# Patient Record
Sex: Female | Born: 1985 | Race: Black or African American | Hispanic: No | Marital: Single | Smoking: Never smoker
Health system: Southern US, Community
[De-identification: ages and names within clinical notes are randomized; demographics above are authoritative.]

## PROBLEM LIST (undated history)

## (undated) ENCOUNTER — Inpatient Hospital Stay (HOSPITAL_COMMUNITY): Payer: Self-pay

## (undated) DIAGNOSIS — Z789 Other specified health status: Secondary | ICD-10-CM

## (undated) DIAGNOSIS — Z973 Presence of spectacles and contact lenses: Secondary | ICD-10-CM

## (undated) DIAGNOSIS — O24419 Gestational diabetes mellitus in pregnancy, unspecified control: Secondary | ICD-10-CM

## (undated) DIAGNOSIS — Z8489 Family history of other specified conditions: Secondary | ICD-10-CM

## (undated) DIAGNOSIS — Z972 Presence of dental prosthetic device (complete) (partial): Secondary | ICD-10-CM

## (undated) HISTORY — DX: Other specified health status: Z78.9

## (undated) HISTORY — DX: Gestational diabetes mellitus in pregnancy, unspecified control: O24.419

## (undated) HISTORY — PX: NO PAST SURGERIES: SHX2092

---

## 2017-01-16 ENCOUNTER — Encounter (HOSPITAL_COMMUNITY): Payer: Self-pay

## 2017-01-16 ENCOUNTER — Emergency Department (HOSPITAL_COMMUNITY)
Admission: EM | Admit: 2017-01-16 | Discharge: 2017-01-16 | Disposition: A | Discharge status: Home / Self Care | Payer: Medicaid Other | Attending: Emergency Medicine | Admitting: Emergency Medicine

## 2017-01-16 DIAGNOSIS — Z3A14 14 weeks gestation of pregnancy: Secondary | ICD-10-CM | POA: Diagnosis not present

## 2017-01-16 DIAGNOSIS — O212 Late vomiting of pregnancy: Secondary | ICD-10-CM | POA: Diagnosis present

## 2017-01-16 DIAGNOSIS — O219 Vomiting of pregnancy, unspecified: Secondary | ICD-10-CM

## 2017-01-16 LAB — COMPREHENSIVE METABOLIC PANEL
ALK PHOS: 78 U/L (ref 38–126)
ALT: 31 U/L (ref 14–54)
AST: 26 U/L (ref 15–41)
Albumin: 3.2 g/dL — ABNORMAL LOW (ref 3.5–5.0)
Anion gap: 7 (ref 5–15)
BILIRUBIN TOTAL: 0.5 mg/dL (ref 0.3–1.2)
BUN: 6 mg/dL (ref 6–20)
CALCIUM: 9.5 mg/dL (ref 8.9–10.3)
CO2: 23 mmol/L (ref 22–32)
CREATININE: 0.74 mg/dL (ref 0.44–1.00)
Chloride: 104 mmol/L (ref 101–111)
Glucose, Bld: 100 mg/dL — ABNORMAL HIGH (ref 65–99)
Potassium: 3.9 mmol/L (ref 3.5–5.1)
Sodium: 134 mmol/L — ABNORMAL LOW (ref 135–145)
TOTAL PROTEIN: 7.4 g/dL (ref 6.5–8.1)

## 2017-01-16 LAB — URINALYSIS, ROUTINE W REFLEX MICROSCOPIC
BILIRUBIN URINE: NEGATIVE
Glucose, UA: NEGATIVE mg/dL
HGB URINE DIPSTICK: NEGATIVE
Ketones, ur: NEGATIVE mg/dL
Leukocytes, UA: NEGATIVE
NITRITE: NEGATIVE
PROTEIN: NEGATIVE mg/dL
Specific Gravity, Urine: 1.017 (ref 1.005–1.030)
pH: 7 (ref 5.0–8.0)

## 2017-01-16 LAB — LIPASE, BLOOD: Lipase: 32 U/L (ref 11–51)

## 2017-01-16 LAB — CBC
HCT: 38.2 % (ref 36.0–46.0)
Hemoglobin: 12.9 g/dL (ref 12.0–15.0)
MCH: 25.7 pg — AB (ref 26.0–34.0)
MCHC: 33.8 g/dL (ref 30.0–36.0)
MCV: 76.2 fL — ABNORMAL LOW (ref 78.0–100.0)
PLATELETS: 276 10*3/uL (ref 150–400)
RBC: 5.01 MIL/uL (ref 3.87–5.11)
RDW: 14.5 % (ref 11.5–15.5)
WBC: 6.7 10*3/uL (ref 4.0–10.5)

## 2017-01-16 LAB — I-STAT BETA HCG BLOOD, ED (MC, WL, AP ONLY)

## 2017-01-16 MED ORDER — ONDANSETRON 4 MG PO TBDP
4.0000 mg | ORAL_TABLET | Freq: Once | ORAL | Status: AC
  Administered 2017-01-16: 4 mg via ORAL
  Filled 2017-01-16: qty 1

## 2017-01-16 MED ORDER — PROMETHAZINE HCL 25 MG PO TABS
25.0000 mg | ORAL_TABLET | Freq: Four times a day (QID) | ORAL | 0 refills | Status: DC | PRN
Start: 2017-01-16 — End: 2017-02-06

## 2017-01-16 MED ORDER — ONDANSETRON 4 MG PO TBDP: 4.0000 mg | ORAL_TABLET | Freq: Once | ORAL | Status: DC | PRN

## 2017-01-16 NOTE — ED Triage Notes (Signed)
LMP 10-06-16, confirmed home pregnancy test.  Pt has been having vomiting since June.  Has not made first OB appt as of yet.  G1P0.  No vaginal bleeding, leaking fluid.

## 2017-01-16 NOTE — ED Provider Notes (Signed)
[facility]-EMERGENCY DEPT Provider Note   CSN: [id number] Arrival date & time: 01/16/17  1120     History   Chief Complaint Chief Complaint  Patient presents with  . Emesis During Pregnancy    HPI Stacey [name] is a 31 y.o. female.  31yo F G1P0 who p/w vomiting. Patient reports that she has had daily vomiting for the past 3 months, usually occurs in the morning. Her last menstrual period was 5/11 and she has had a positive pregnancy test at [facility]. She has not yet established care with an OB/GYN because she does not have insurance. She denies any associated fevers, diarrhea, vaginal bleeding, passage of fluid, urinary symptoms cough/cold symptoms. She reports some upper abdominal pain when she vomits, none currently. She has not taken any medications for her symptoms, although she is taking prenatal vitamin.   The history is provided by the patient.    History reviewed. No pertinent past medical history.  There are no active problems to display for this patient.   History reviewed. No pertinent surgical history.  OB History    Gravida Para Term Preterm AB Living   1             SAB TAB Ectopic Multiple Live Births                   Home Medications    Prior to Admission medications   Medication Sig Start Date End Date Taking? Authorizing Provider  promethazine (PHENERGAN) 25 MG tablet Take 1 tablet (25 mg total) by mouth every 6 (six) hours as needed for nausea or vomiting. 01/16/17   Lavoris Canizales, Ambrose Finland, MD    Family History History reviewed. No pertinent family history.  Social History Social History  Substance Use Topics  . Smoking status: Never Smoker  . Smokeless tobacco: Never Used  . Alcohol use No     Allergies   Patient has no known allergies.   Review of Systems Review of Systems All other systems reviewed and are negative except that which was mentioned in HPI   Physical Exam Updated Vital Signs BP 110/69 (BP Location: Right Arm)   Pulse 72    Temp 98.4 F (36.9 C) (Oral)   Resp 16   LMP 10/06/2016   SpO2 100%   Physical Exam  Constitutional: She is oriented to person, place, and time. She appears well-developed and well-nourished. No distress.  HENT:  Head: Normocephalic and atraumatic.  Moist mucous membranes  Eyes: Pupils are equal, round, and reactive to light. Conjunctivae are normal.  Neck: Neck supple.  Cardiovascular: Normal rate, regular rhythm and normal heart sounds.   No murmur heard. Pulmonary/Chest: Effort normal and breath sounds normal.  Abdominal: Soft. Bowel sounds are normal. She exhibits no distension. There is no tenderness.  Musculoskeletal: She exhibits no edema.  Neurological: She is alert and oriented to person, place, and time.  Fluent speech  Skin: Skin is warm and dry.  Psychiatric: She has a normal mood and affect. Judgment normal.  Nursing note and vitals reviewed.    ED Treatments / Results  Labs (all labs ordered are listed, but only abnormal results are displayed) Labs Reviewed  COMPREHENSIVE METABOLIC PANEL - Abnormal; Notable for the following:       Result Value   Sodium 134 (*)    Glucose, Bld 100 (*)    Albumin 3.2 (*)    All other components within normal limits  CBC - Abnormal; Notable for the following:  MCV 76.2 (*)    MCH 25.7 (*)    All other components within normal limits  I-STAT BETA HCG BLOOD, ED ([facility], [facility], [facility] ONLY) - Abnormal; Notable for the following:    I-stat hCG, quantitative >2,000.0 (*)    All other components within normal limits  LIPASE, BLOOD  URINALYSIS, ROUTINE W REFLEX MICROSCOPIC    EKG  EKG Interpretation None       Radiology No results found.  Procedures Procedures (including critical care time) EMERGENCY DEPARTMENT [place] PREGNANCY "Study: Limited Ultrasound of the Pelvis for Pregnancy"  INDICATIONS:Pregnancy(required) Multiple views of the uterus and pelvic cavity were obtained in real-time with a multi-frequency  probe.  APPROACH:Transabdominal  PERFORMED BY: Myself IMAGES ARCHIVED?: Yes LIMITATIONS: none PREGNANCY FREE FLUID: None ADNEXAL FINDINGS:Right ovary not seen left ovary not seen, no masses GESTATIONAL AGE, ESTIMATE: 14 wk 4d FETAL HEART RATE: 158 INTERPRETATION: Fetal heart activity seen     Medications Ordered in ED Medications  ondansetron (ZOFRAN-ODT) disintegrating tablet 4 mg (4 mg Oral Given 01/16/17 1802)  ondansetron (ZOFRAN-ODT) disintegrating tablet 4 mg (4 mg Oral Given 01/16/17 1931)     Initial Impression / Assessment and Plan / ED Course  I have reviewed the triage vital signs and the nursing notes.  Pertinent labs results that were available during my care of the patient were reviewed by me and considered in my medical decision making (see chart for details).    PT w/ daily vomiting, usually in the mornings, in setting of positive pregnancy test at home. She was nontoxic on exam with normal vital signs. No focal abdominal tenderness. Her lab work here is unremarkable including normal CBC and CMP,normal UA, no evidence of infection or dehydration. Bedside ultrasound shows normal fetal movement, fetal heart rate around 150s, BPD roughly 14 weeks which corresponds with LMP. I spent time educating the patient on routine first trimester care and emphasized the importance of establishing care with an OB/GYN as soon as possible. Referred patient to [facility] clinic. Provided with information to acquire medicaid.   Regarding her vomiting, the pattern of the vomiting and daily occurrence suggest hyperemesis gravidarum.She appears well compensated currently. Gave Zofran orally and PO challenged. Provided phenergan to use at home as pt uninsured and I was concerned about potential prohibitive cost of Zofran. Reviewed return precautions including vaginal bleeding or discharge, abdominal pain, or fever. Patient voiced understanding and was discharged in satisfactory  condition.  Final Clinical Impressions(s) / ED Diagnoses   Final diagnoses:  Nausea and vomiting during pregnancy prior to [redacted] weeks gestation    New Prescriptions New Prescriptions   PROMETHAZINE (PHENERGAN) 25 MG TABLET    Take 1 tablet (25 mg total) by mouth every 6 (six) hours as needed for nausea or vomiting.     Jadea Shiffer, Ambrose Finland, MD 01/16/17 2003

## 2017-01-16 NOTE — Discharge Instructions (Signed)
Bring your labwork, proof of income, social security card, and identification (driver's license), proof of residency and go to DEPARTMENT OF SOCIAL SERVICES first thing in the morning.   Take nausea medicine as needed; it may make you sleepy. Eat small meals frequently and avoid going long periods of time without eating. Stay well hydrated.

## 2017-01-16 NOTE — ED Notes (Signed)
Pt unable to keep fluids down

## 2017-01-18 ENCOUNTER — Ambulatory Visit (INDEPENDENT_AMBULATORY_CARE_PROVIDER_SITE_OTHER): Payer: Self-pay

## 2017-01-18 DIAGNOSIS — Z3201 Encounter for pregnancy test, result positive: Secondary | ICD-10-CM

## 2017-01-18 LAB — POCT PREGNANCY, URINE: Preg Test, Ur: POSITIVE — AB

## 2017-01-18 NOTE — Progress Notes (Signed)
Presented to the office for a UPT. UPT positive. Patient stated that she is taking prenatal vitamins and will start prenatal care.

## 2017-01-19 NOTE — Progress Notes (Signed)
Chart reviewed. Agree with CMA care.   Marny Lowenstein, PA-C 01/19/2017 10:54 AM

## 2017-02-06 ENCOUNTER — Encounter: Payer: Self-pay | Admitting: Obstetrics and Gynecology

## 2017-02-06 ENCOUNTER — Ambulatory Visit (INDEPENDENT_AMBULATORY_CARE_PROVIDER_SITE_OTHER): Payer: Medicaid Other | Admitting: Obstetrics and Gynecology

## 2017-02-06 ENCOUNTER — Other Ambulatory Visit (HOSPITAL_COMMUNITY)
Admission: RE | Admit: 2017-02-06 | Discharge: 2017-02-06 | Disposition: A | Discharge status: Home / Self Care | Payer: Medicaid Other | Source: Ambulatory Visit | Attending: Obstetrics and Gynecology | Admitting: Obstetrics and Gynecology

## 2017-02-06 VITALS — BP 133/80 | HR 109 | Ht 66.0 in | Wt 182.6 lb

## 2017-02-06 DIAGNOSIS — O219 Vomiting of pregnancy, unspecified: Secondary | ICD-10-CM | POA: Diagnosis not present

## 2017-02-06 DIAGNOSIS — Z3402 Encounter for supervision of normal first pregnancy, second trimester: Secondary | ICD-10-CM | POA: Diagnosis not present

## 2017-02-06 DIAGNOSIS — Z3689 Encounter for other specified antenatal screening: Secondary | ICD-10-CM | POA: Insufficient documentation

## 2017-02-06 DIAGNOSIS — Z331 Pregnant state, incidental: Secondary | ICD-10-CM

## 2017-02-06 DIAGNOSIS — Z23 Encounter for immunization: Secondary | ICD-10-CM | POA: Insufficient documentation

## 2017-02-06 DIAGNOSIS — Z124 Encounter for screening for malignant neoplasm of cervix: Secondary | ICD-10-CM | POA: Diagnosis not present

## 2017-02-06 DIAGNOSIS — Z3400 Encounter for supervision of normal first pregnancy, unspecified trimester: Secondary | ICD-10-CM | POA: Diagnosis present

## 2017-02-06 DIAGNOSIS — Z113 Encounter for screening for infections with a predominantly sexual mode of transmission: Secondary | ICD-10-CM | POA: Diagnosis not present

## 2017-02-06 DIAGNOSIS — Z302 Encounter for sterilization: Secondary | ICD-10-CM | POA: Insufficient documentation

## 2017-02-06 DIAGNOSIS — Z3490 Encounter for supervision of normal pregnancy, unspecified, unspecified trimester: Secondary | ICD-10-CM | POA: Insufficient documentation

## 2017-02-06 LAB — POCT URINALYSIS DIP (DEVICE)
BILIRUBIN URINE: NEGATIVE
Glucose, UA: NEGATIVE mg/dL
HGB URINE DIPSTICK: NEGATIVE
Ketones, ur: NEGATIVE mg/dL
LEUKOCYTES UA: NEGATIVE
NITRITE: NEGATIVE
PH: 6 (ref 5.0–8.0)
Protein, ur: 30 mg/dL — AB
SPECIFIC GRAVITY, URINE: 1.02 (ref 1.005–1.030)
UROBILINOGEN UA: 0.2 mg/dL (ref 0.0–1.0)

## 2017-02-06 MED ORDER — PROMETHAZINE HCL 25 MG PO TABS: 25.0000 mg | ORAL_TABLET | Freq: Every evening | ORAL | 1 refills | Status: DC | PRN

## 2017-02-06 MED ORDER — ONDANSETRON 8 MG PO TBDP: 8.0000 mg | ORAL_TABLET | Freq: Three times a day (TID) | ORAL | 1 refills | Status: DC | PRN

## 2017-02-06 MED ORDER — METOCLOPRAMIDE HCL 10 MG PO TABS: 10.0000 mg | ORAL_TABLET | Freq: Three times a day (TID) | ORAL | 1 refills | Status: DC

## 2017-02-06 MED ORDER — RANITIDINE HCL 150 MG PO TABS: 150.0000 mg | ORAL_TABLET | Freq: Two times a day (BID) | ORAL | 1 refills | Status: DC

## 2017-02-06 NOTE — Progress Notes (Signed)
Subjective:   Stacey [name] is a 31 y.o. G1P0 at [redacted]w[redacted]d by LMP being seen today for her first obstetrical visit.  Her obstetrical history is significant for healthy female , nausea and vomiting in pregnancy. Taking phenergan however makes her somnolent.  Patient does intend to breast feed. Pregnancy history fully reviewed. Planned pregnancy, FOB involved.   Patient reports nausea and vomiting daily. Multiple times per day. Has tried phenergan however concerned about how it makes her sleepy. She was only given 12 tablets.   HISTORY: Obstetric History   G1   P0   T0   P0   A0   L0    SAB0   TAB0   Ectopic0   Multiple0   Live Births0     # Outcome Date GA Lbr Len/2nd Weight Sex Delivery Anes PTL Lv  1 Current              Past Medical History:  Diagnosis Date  . Medical history non-contributory    Past Surgical History:  Procedure Laterality Date  . NO PAST SURGERIES     Family History  Problem Relation Age of Onset  . Hypertension Mother    Social History  Substance Use Topics  . Smoking status: Never Smoker  . Smokeless tobacco: Never Used  . Alcohol use No   No Known Allergies Current Outpatient Prescriptions on File Prior to Visit  Medication Sig Dispense Refill  . Prenatal Multivit-Min-Fe-FA (PRENATAL VITAMINS PO) Take by mouth.     No current facility-administered medications on file prior to visit.      Exam   Vitals:   02/06/17 1007 02/06/17 1013  BP: 133/80   Pulse: (!) 109   Weight: 182 lb 9.6 oz (82.8 kg)   Height:   (1.676 m)   Fetal Heart Rate (bpm): 145  Uterus:     Pelvic Exam: Perineum: no hemorrhoids, normal perineum   Vulva: normal external genitalia, no lesions   Vagina:  normal mucosa, normal discharge   Cervix: no lesions and normal, pap smear done.    Adnexa: normal adnexa and no mass, fullness, tenderness   Bony Pelvis: average  System: General: well-developed, well-nourished female in no acute distress   Breast:  normal  appearance, no masses or tenderness   Skin: normal coloration and turgor, no rashes   Neurologic: oriented, normal, negative, normal mood   Extremities: normal strength, tone, and muscle mass, ROM of all joints is normal   HEENT PERRLA, extraocular movement intact and sclera clear, anicteric   Mouth/Teeth mucous membranes moist, pharynx normal without lesions and dental hygiene good   Neck supple and no masses   Cardiovascular: regular rate and rhythm   Respiratory:  no respiratory distress, normal breath sounds   Abdomen: soft, non-tender; bowel sounds normal; no masses,  no organomegaly     Assessment:   Pregnancy: G1P0 Patient Active Problem List   Diagnosis Date Noted  . Supervision of low-risk first pregnancy 02/06/2017  . Nausea and vomiting in pregnancy prior to [redacted] weeks gestation 02/06/2017  . Needs flu shot 02/06/2017  . Screening, antenatal, for fetal anatomic survey 02/06/2017     Plan:  1. Encounter for supervision of low-risk first pregnancy, antepartum  - Flu Vaccine QUAD 36+ mos IM (Fluarix, Quad PF) - Obstetric Panel, Including HIV - Hemoglobinopathy Evaluation - Culture, OB Urine - [place] MFM OB COMP + 14 WK; Future - Enroll patient in Babyscripts Program - AFP TETRA - Cytology -  PAP  2. Needs flu shot  - Flu Vaccine QUAD 36+ mos IM (Fluarix, Quad PF)  3. Screening, antenatal, for fetal anatomic survey  - US MFM OB COMP + 14 WK; Future  4. Nausea and vomiting in pregnancy prior to [redacted] weeks gestation  Rx: Zantac, Phenergan, Reglan, Zofran. Do not take phenergan and reglan together.  Small, frequent meals.  Increase PO fluids.    Initial labs drawn. Continue prenatal vitamins. Genetic Screening discussed, quad screen requested  Ultrasound discussed; fetal anatomic survey: requested. Problem list reviewed and updated. The nature of [facility] with multiple MDs and other Advanced Practice Providers was explained to  patient; also emphasized that residents, students are part of our team. Routine obstetric precautions reviewed. Return in about 4 weeks (around 03/06/2017).   Josalyn Dettmann, Harolyn RutherfordJennifer I, NP [facility], [facility]

## 2017-02-06 NOTE — Progress Notes (Signed)
Babyscripts optimized schedule Flu vaccine today Initial prenatal labs/pap today Schedule u/s

## 2017-02-08 LAB — CULTURE, OB URINE

## 2017-02-08 LAB — URINE CULTURE, OB REFLEX

## 2017-02-09 LAB — CYTOLOGY - PAP
Adequacy: ABSENT
Chlamydia: NEGATIVE
Diagnosis: NEGATIVE
HPV: NOT DETECTED
Neisseria Gonorrhea: NEGATIVE

## 2017-02-14 ENCOUNTER — Encounter: Payer: Self-pay | Admitting: Obstetrics and Gynecology

## 2017-02-14 DIAGNOSIS — D563 Thalassemia minor: Secondary | ICD-10-CM | POA: Insufficient documentation

## 2017-02-14 DIAGNOSIS — D573 Sickle-cell trait: Secondary | ICD-10-CM | POA: Insufficient documentation

## 2017-02-14 LAB — OBSTETRIC PANEL, INCLUDING HIV
Antibody Screen: NEGATIVE
BASOS ABS: 0 10*3/uL (ref 0.0–0.2)
BASOS: 0 %
EOS (ABSOLUTE): 0.1 10*3/uL (ref 0.0–0.4)
Eos: 1 %
HEMATOCRIT: 38.7 % (ref 34.0–46.6)
HIV Screen 4th Generation wRfx: NONREACTIVE
Hemoglobin: 12.6 g/dL (ref 11.1–15.9)
Hepatitis B Surface Ag: NEGATIVE
IMMATURE GRANS (ABS): 0 10*3/uL (ref 0.0–0.1)
IMMATURE GRANULOCYTES: 0 %
LYMPHS: 23 %
Lymphocytes Absolute: 2 10*3/uL (ref 0.7–3.1)
MCH: 25.7 pg — ABNORMAL LOW (ref 26.6–33.0)
MCHC: 32.6 g/dL (ref 31.5–35.7)
MCV: 79 fL (ref 79–97)
MONOS ABS: 0.6 10*3/uL (ref 0.1–0.9)
Monocytes: 7 %
NEUTROS PCT: 69 %
Neutrophils Absolute: 5.7 10*3/uL (ref 1.4–7.0)
Platelets: 263 10*3/uL (ref 150–379)
RBC: 4.91 x10E6/uL (ref 3.77–5.28)
RDW: 16.4 % — AB (ref 12.3–15.4)
RPR Ser Ql: NONREACTIVE
RUBELLA: 24.2 {index} (ref >0.99)
Rh Factor: POSITIVE
WBC: 8.4 10*3/uL (ref 3.4–10.8)

## 2017-02-14 LAB — HEMOGLOBINOPATHY EVALUATION
Ferritin: 87 ng/mL (ref 15–150)
HGB A: 62.5 % — AB (ref 96.4–98.8)
HGB F QUANT: 0 % (ref 0.0–2.0)
HGB S: 33.5 % — AB
HGB SOLUBILITY: POSITIVE — AB
HGB VARIANT: 0 %
Hgb A2 Quant: 4 % — ABNORMAL HIGH (ref 1.8–3.2)
Hgb C: 0 %

## 2017-02-14 LAB — ALPHA-THALASSEMIA

## 2017-02-15 ENCOUNTER — Encounter: Payer: Self-pay | Admitting: Obstetrics & Gynecology

## 2017-02-19 ENCOUNTER — Ambulatory Visit (HOSPITAL_COMMUNITY)
Admission: RE | Admit: 2017-02-19 | Discharge: 2017-02-19 | Disposition: A | Discharge status: Home / Self Care | Payer: Medicaid Other | Source: Ambulatory Visit | Attending: Obstetrics and Gynecology | Admitting: Obstetrics and Gynecology

## 2017-02-19 DIAGNOSIS — Z3400 Encounter for supervision of normal first pregnancy, unspecified trimester: Secondary | ICD-10-CM

## 2017-02-19 DIAGNOSIS — Z3689 Encounter for other specified antenatal screening: Secondary | ICD-10-CM | POA: Diagnosis not present

## 2017-02-19 DIAGNOSIS — Z3A19 19 weeks gestation of pregnancy: Secondary | ICD-10-CM | POA: Diagnosis not present

## 2017-03-06 ENCOUNTER — Encounter: Payer: Self-pay | Admitting: Family Medicine

## 2017-03-06 ENCOUNTER — Ambulatory Visit (INDEPENDENT_AMBULATORY_CARE_PROVIDER_SITE_OTHER): Payer: Medicaid Other | Admitting: Family Medicine

## 2017-03-06 VITALS — BP 124/70 | HR 110 | Wt 185.0 lb

## 2017-03-06 DIAGNOSIS — Z3689 Encounter for other specified antenatal screening: Secondary | ICD-10-CM

## 2017-03-06 DIAGNOSIS — Z3402 Encounter for supervision of normal first pregnancy, second trimester: Secondary | ICD-10-CM

## 2017-03-06 MED ORDER — DOXYLAMINE SUCCINATE (SLEEP) 25 MG PO TABS: 25.0000 mg | ORAL_TABLET | Freq: Every evening | ORAL | 0 refills | Status: DC | PRN

## 2017-03-06 NOTE — Addendum Note (Signed)
Addended by: Rolm Bookbinder on: 03/06/2017 03:54 PM   Modules accepted: Orders

## 2017-03-06 NOTE — Progress Notes (Signed)
Patient stated having some back pain

## 2017-03-06 NOTE — Addendum Note (Signed)
Addended by: Garret Reddish on: 03/06/2017 04:03 PM   Modules accepted: Orders

## 2017-03-06 NOTE — Progress Notes (Signed)
   PRENATAL VISIT NOTE  Subjective:  Stacey [name] is a 31 y.o. G1P0 at [redacted]w[redacted]d being seen today for ongoing prenatal care.  She is currently monitored for the following issues for this low-risk pregnancy and has Supervision of low-risk first pregnancy; Nausea and vomiting in pregnancy prior to [redacted] weeks gestation; Needs flu shot; Screening, antenatal, for fetal anatomic survey; Sickle cell trait (HCC); and Alpha thalassemia trait on her problem list.  Patient reports occasional back pain that is relieved by tylenol.  Contractions: Not present.  .  Movement: Present. Denies leaking of fluid.   The following portions of the patient's history were reviewed and updated as appropriate: allergies, current medications, past family history, past medical history, past social history, past surgical history and problem list. Problem list updated.  Objective:   Vitals:   03/06/17 1525  BP: 124/70  Pulse: (!) 110  Weight: 185 lb (83.9 kg)    Fetal Status: Fetal Heart Rate (bpm): 146 Fundal Height: 20 cm Movement: Present     General:  Alert, oriented and cooperative. Patient is in no acute distress.  Skin: Skin is warm and dry. No rash noted.   Cardiovascular: Normal heart rate noted  Respiratory: Normal respiratory effort, no problems with respiration noted  Abdomen: Soft, gravid, appropriate for gestational age.  Pain/Pressure: Present     Pelvic: Cervical exam deferred        Extremities: Normal range of motion.  Edema: None  Mental Status:  Normal mood and affect. Normal behavior. Normal judgment and thought content.   Assessment and Plan:  Pregnancy: G1P0 at [redacted]w[redacted]d Will need repeat US around 10/22- will schedule.  Preterm labor symptoms and general obstetric precautions including but not limited to vaginal bleeding, contractions, leaking of fluid and fetal movement were reviewed in detail with the patient. Please refer to After Visit Summary for other counseling recommendations.  Return in  about 4 weeks (around 04/03/2017).   Rolm Bookbinder, DO

## 2017-03-09 ENCOUNTER — Encounter: Payer: Medicaid Other | Admitting: Family Medicine

## 2017-03-19 ENCOUNTER — Ambulatory Visit (HOSPITAL_COMMUNITY): Payer: Medicaid Other

## 2017-03-23 ENCOUNTER — Ambulatory Visit (HOSPITAL_COMMUNITY)
Admission: RE | Admit: 2017-03-23 | Discharge: 2017-03-23 | Disposition: A | Discharge status: Home / Self Care | Payer: Medicaid Other | Source: Ambulatory Visit | Attending: Family Medicine | Admitting: Family Medicine

## 2017-03-23 DIAGNOSIS — Z862 Personal history of diseases of the blood and blood-forming organs and certain disorders involving the immune mechanism: Secondary | ICD-10-CM | POA: Insufficient documentation

## 2017-03-23 DIAGNOSIS — Z3689 Encounter for other specified antenatal screening: Secondary | ICD-10-CM | POA: Insufficient documentation

## 2017-03-23 DIAGNOSIS — Z3A23 23 weeks gestation of pregnancy: Secondary | ICD-10-CM | POA: Diagnosis not present

## 2017-03-26 ENCOUNTER — Emergency Department (HOSPITAL_COMMUNITY)
Admission: EM | Admit: 2017-03-26 | Discharge: 2017-03-26 | Disposition: A | Discharge status: Home / Self Care | Payer: Medicaid Other | Attending: Emergency Medicine | Admitting: Emergency Medicine

## 2017-03-26 ENCOUNTER — Encounter (HOSPITAL_COMMUNITY): Payer: Self-pay | Admitting: Emergency Medicine

## 2017-03-26 ENCOUNTER — Emergency Department (HOSPITAL_COMMUNITY): Payer: Medicaid Other

## 2017-03-26 DIAGNOSIS — R197 Diarrhea, unspecified: Secondary | ICD-10-CM | POA: Diagnosis not present

## 2017-03-26 DIAGNOSIS — O26892 Other specified pregnancy related conditions, second trimester: Secondary | ICD-10-CM | POA: Diagnosis not present

## 2017-03-26 DIAGNOSIS — R1010 Upper abdominal pain, unspecified: Secondary | ICD-10-CM | POA: Diagnosis present

## 2017-03-26 DIAGNOSIS — R1011 Right upper quadrant pain: Secondary | ICD-10-CM

## 2017-03-26 DIAGNOSIS — Z79899 Other long term (current) drug therapy: Secondary | ICD-10-CM | POA: Insufficient documentation

## 2017-03-26 DIAGNOSIS — O99612 Diseases of the digestive system complicating pregnancy, second trimester: Secondary | ICD-10-CM | POA: Insufficient documentation

## 2017-03-26 DIAGNOSIS — K859 Acute pancreatitis without necrosis or infection, unspecified: Secondary | ICD-10-CM

## 2017-03-26 DIAGNOSIS — O218 Other vomiting complicating pregnancy: Secondary | ICD-10-CM | POA: Diagnosis not present

## 2017-03-26 DIAGNOSIS — Z3A24 24 weeks gestation of pregnancy: Secondary | ICD-10-CM | POA: Insufficient documentation

## 2017-03-26 DIAGNOSIS — M549 Dorsalgia, unspecified: Secondary | ICD-10-CM | POA: Insufficient documentation

## 2017-03-26 LAB — COMPREHENSIVE METABOLIC PANEL
ALBUMIN: 2.8 g/dL — AB (ref 3.5–5.0)
ALK PHOS: 180 U/L — AB (ref 38–126)
ALT: 27 U/L (ref 14–54)
ANION GAP: 7 (ref 5–15)
AST: 35 U/L (ref 15–41)
CALCIUM: 9.1 mg/dL (ref 8.9–10.3)
CO2: 23 mmol/L (ref 22–32)
Chloride: 105 mmol/L (ref 101–111)
Creatinine, Ser: 0.69 mg/dL (ref 0.44–1.00)
GFR calc Af Amer: >60 mL/min (ref >60)
GFR calc non Af Amer: >60 mL/min (ref >60)
GLUCOSE: 110 mg/dL — AB (ref 65–99)
Potassium: 3.5 mmol/L (ref 3.5–5.1)
SODIUM: 135 mmol/L (ref 135–145)
Total Bilirubin: 0.6 mg/dL (ref 0.3–1.2)
Total Protein: 6.8 g/dL (ref 6.5–8.1)

## 2017-03-26 LAB — URINALYSIS, ROUTINE W REFLEX MICROSCOPIC
Bilirubin Urine: NEGATIVE
Glucose, UA: NEGATIVE mg/dL
Hgb urine dipstick: NEGATIVE
Ketones, ur: NEGATIVE mg/dL
Leukocytes, UA: NEGATIVE
Nitrite: NEGATIVE
Protein, ur: 30 mg/dL — AB
SPECIFIC GRAVITY, URINE: 1.012 (ref 1.005–1.030)
pH: 8 (ref 5.0–8.0)

## 2017-03-26 LAB — CBC
HEMATOCRIT: 34.9 % — AB (ref 36.0–46.0)
HEMOGLOBIN: 11.6 g/dL — AB (ref 12.0–15.0)
MCH: 26.4 pg (ref 26.0–34.0)
MCHC: 33.2 g/dL (ref 30.0–36.0)
MCV: 79.5 fL (ref 78.0–100.0)
Platelets: 269 10*3/uL (ref 150–400)
RBC: 4.39 MIL/uL (ref 3.87–5.11)
RDW: 14.5 % (ref 11.5–15.5)
WBC: 9.3 10*3/uL (ref 4.0–10.5)

## 2017-03-26 LAB — LIPASE, BLOOD: Lipase: 128 U/L — ABNORMAL HIGH (ref 11–51)

## 2017-03-26 MED ORDER — SODIUM CHLORIDE 0.9 % IV BOLUS (SEPSIS)
1000.0000 mL | Freq: Once | INTRAVENOUS | Status: AC
  Administered 2017-03-26: 1000 mL via INTRAVENOUS

## 2017-03-26 MED ORDER — GI COCKTAIL ~~LOC~~
30.0000 mL | Freq: Once | ORAL | Status: AC
  Administered 2017-03-26: 30 mL via ORAL
  Filled 2017-03-26: qty 30

## 2017-03-26 MED ORDER — HYDROCODONE-ACETAMINOPHEN 5-325 MG PO TABS: 1.0000 | ORAL_TABLET | ORAL | 0 refills | Status: DC | PRN

## 2017-03-26 MED ORDER — ONDANSETRON HCL 4 MG/2ML IJ SOLN
4.0000 mg | Freq: Once | INTRAMUSCULAR | Status: AC
  Administered 2017-03-26: 4 mg via INTRAVENOUS
  Filled 2017-03-26: qty 2

## 2017-03-26 MED ORDER — HYDROCODONE-ACETAMINOPHEN 5-325 MG PO TABS
2.0000 | ORAL_TABLET | Freq: Once | ORAL | Status: AC
  Administered 2017-03-26: 2 via ORAL
  Filled 2017-03-26: qty 2

## 2017-03-26 MED ORDER — PROMETHAZINE HCL 25 MG PO TABS: 25.0000 mg | ORAL_TABLET | Freq: Four times a day (QID) | ORAL | 0 refills | Status: DC | PRN

## 2017-03-26 MED ORDER — PROMETHAZINE HCL 25 MG PO TABS
25.0000 mg | ORAL_TABLET | Freq: Once | ORAL | Status: AC
Start: 2017-03-26 — End: 2017-03-26
  Administered 2017-03-26: 25 mg via ORAL
  Filled 2017-03-26: qty 1

## 2017-03-26 NOTE — ED Provider Notes (Signed)
[facility] EMERGENCY DEPARTMENT Provider Note   CSN: [id number] Arrival date & time: 03/26/17  0439     History   Chief Complaint Chief Complaint  Patient presents with  . Abdominal Pain    HPI Stacey [name] is a 31 y.o. female.  Patient is [redacted] weeks pregnant G1P0 presenting with upper abdominal pain that woke her from sleep.  The pain is constant and radiates to her upper abdomen and right side.  Associated multiple episodes of nausea and vomiting.  She said vomiting throughout this pregnancy though none in the past week.  Denies any pain with urination or blood in the urine.  No vaginal bleeding or leak of fluid.  No contractions.  Denies any chest pain or shortness of breath.  Denies any other medical problems or previous surgeries.  He states she woke from sleep about 3 AM with this pain has been constant.  She has not had this pain in this pregnancy previously.   The history is provided by the patient and the spouse.  Abdominal Pain   Associated symptoms include diarrhea, nausea and vomiting. Pertinent negatives include fever, dysuria, hematuria, headaches, arthralgias and myalgias.    Past Medical History:  Diagnosis Date  . Medical history non-contributory     Patient Active Problem List   Diagnosis Date Noted  . Sickle cell trait (HCC) 02/14/2017  . Alpha thalassemia trait 02/14/2017  . Supervision of low-risk first pregnancy 02/06/2017  . Nausea and vomiting in pregnancy prior to [redacted] weeks gestation 02/06/2017  . Needs flu shot 02/06/2017  . Screening, antenatal, for fetal anatomic survey 02/06/2017    Past Surgical History:  Procedure Laterality Date  . NO PAST SURGERIES      OB History    Gravida Para Term Preterm AB Living   1             SAB TAB Ectopic Multiple Live Births                   Home Medications    Prior to Admission medications   Medication Sig Start Date End Date Taking? Authorizing Provider  doxylamine, Sleep,  (UNISOM) 25 MG tablet Take 1 tablet (25 mg total) by mouth at bedtime as needed. 03/06/17   Moss, Amber, DO  metoCLOPramide (REGLAN) 10 MG tablet Take 1 tablet (10 mg total) by mouth 3 (three) times daily before meals. 02/06/17 02/06/18  Rasch, Victorino Dike I, NP  ondansetron (ZOFRAN ODT) 8 MG disintegrating tablet Take 1 tablet (8 mg total) by mouth every 8 (eight) hours as needed for nausea or vomiting. 02/06/17   Rasch, Harolyn Rutherford, NP  Prenatal Multivit-Min-Fe-FA (PRENATAL VITAMINS PO) Take by mouth.    [provider]  promethazine (PHENERGAN) 25 MG tablet Take 1 tablet (25 mg total) by mouth at bedtime as needed for nausea or vomiting. 02/06/17   Rasch, Harolyn Rutherford, NP  ranitidine (ZANTAC) 150 MG tablet Take 1 tablet (150 mg total) by mouth 2 (two) times daily. 02/06/17 02/06/18  Rasch, Harolyn Rutherford, NP    Family History Family History  Problem Relation Age of Onset  . Hypertension Mother     Social History Social History  Substance Use Topics  . Smoking status: Never Smoker  . Smokeless tobacco: Never Used  . Alcohol use No     Allergies   Patient has no known allergies.   Review of Systems Review of Systems  Constitutional: Negative for activity change, appetite change and fever.  Eyes: Negative for visual disturbance.  Respiratory: Negative for cough, chest tightness and shortness of breath.   Cardiovascular: Negative for chest pain.  Gastrointestinal: Positive for abdominal pain, diarrhea, nausea and vomiting.  Genitourinary: Negative for dysuria, hematuria, urgency, vaginal bleeding and vaginal discharge.  Musculoskeletal: Positive for back pain. Negative for arthralgias and myalgias.  Skin: Negative for rash.  Neurological: Negative for dizziness, weakness and headaches.    all other systems are negative except as noted in the HPI and PMH.    Physical Exam Updated Vital Signs BP 122/74 (BP Location: Right Arm)   Pulse (!) 112   Temp 98.6 F (37 C) (Oral)   Resp  19   Ht 5\' 6"  (1.676 m)   Wt 83.9 kg (185 lb)   LMP 10/06/2016 (Approximate)   SpO2 100%   BMI 29.86 kg/m   Physical Exam  Constitutional: She is oriented to person, place, and time. She appears well-developed and well-nourished. No distress.  uncomfortable  HENT:  Head: Normocephalic and atraumatic.  Mouth/Throat: Oropharynx is clear and moist. No oropharyngeal exudate.  Eyes: Pupils are equal, round, and reactive to light. Conjunctivae and EOM are normal.  Neck: Normal range of motion. Neck supple.  No meningismus.  Cardiovascular: Normal rate, regular rhythm, normal heart sounds and intact distal pulses.   No murmur heard. Pulmonary/Chest: Effort normal and breath sounds normal. No respiratory distress.  Abdominal: Soft. There is tenderness. There is no rebound and no guarding.  TTP epigastrium and RUQ. Gravid lower abdomen, nontender  Musculoskeletal: Normal range of motion. She exhibits no edema or tenderness.  No CVAT  Neurological: She is alert and oriented to person, place, and time. No cranial nerve deficit. She exhibits normal muscle tone. Coordination normal.   5/5 strength throughout. CN 2-12 intact.Equal grip strength.   Skin: Skin is warm.  Psychiatric: She has a normal mood and affect. Her behavior is normal.  Nursing note and vitals reviewed.    ED Treatments / Results  Labs (all labs ordered are listed, but only abnormal results are displayed) Labs Reviewed  LIPASE, BLOOD - Abnormal; Notable for the following:       Result Value   Lipase 128 (*)    All other components within normal limits  COMPREHENSIVE METABOLIC PANEL - Abnormal; Notable for the following:    Glucose, Bld 110 (*)    BUN <5 (*)    Albumin 2.8 (*)    Alkaline Phosphatase 180 (*)    All other components within normal limits  CBC - Abnormal; Notable for the following:    Hemoglobin 11.6 (*)    HCT 34.9 (*)    All other components within normal limits  URINALYSIS, ROUTINE W REFLEX  MICROSCOPIC - Abnormal; Notable for the following:    APPearance HAZY (*)    Protein, ur 30 (*)    Bacteria, UA RARE (*)    Squamous Epithelial / LPF 6-30 (*)    All other components within normal limits    EKG  EKG Interpretation None       Radiology US Abdomen Limited Ruq  Result Date: 03/26/2017 CLINICAL DATA:  RIGHT upper quadrant pain for 2 hours. [redacted] weeks pregnant. EXAM: ULTRASOUND ABDOMEN LIMITED RIGHT UPPER QUADRANT COMPARISON:  None. FINDINGS: Gallbladder: No gallstones or wall thickening visualized. No sonographic Murphy sign noted by sonographer. Common bile duct: Diameter: 4 mm Liver: No focal lesion identified. Within normal limits in parenchymal echogenicity. Portal vein is patent on color Doppler imaging with normal  direction of blood flow towards the liver. IMPRESSION: Normal RIGHT upper quadrant ultrasound. Electronically Signed   By: Awilda Metroourtnay  Bloomer M.D.   On: 03/26/2017 06:42    Procedures Procedures (including critical care time)  Medications Ordered in ED Medications  sodium chloride 0.9 % bolus 1,000 mL (not administered)  ondansetron (ZOFRAN) injection 4 mg (not administered)  gi cocktail (Maalox,Lidocaine,Donnatal) (not administered)     Initial Impression / Assessment and Plan / ED Course  I have reviewed the triage vital signs and the nursing notes.  Pertinent labs & imaging results that were available during my care of the patient were reviewed by me and considered in my medical decision making (see chart for details).    G1 P0 at [redacted] weeks gestation presenting with upper abdominal pain with nausea and vomiting.  No fever.  No vaginal or urinary symptoms  Patient given IV fluids and symptom control.  Labs show mild elevation of lipase at 128.  Ultrasound will be obtained to evaluate gallbladder  IVF, GI cocktail. Antiemetics. US shows no gallstones.  Patient agreeable to short course of pain medications with the understanding that any  medications given will potentially affect fetus.  OB RR nurse Olegario MessierKathy has seen patient. Reassuring tracing.  OB cleared per Dr. Emelda FearFerguson.  Pain and nausea controlled in the ED. Patient is tolerating PO. Advised clear liquid diet for several days and OB followup. Return precautions discussed.  Final Clinical Impressions(s) / ED Diagnoses   Final diagnoses:  RUQ pain  Acute pancreatitis, unspecified complication status, unspecified pancreatitis type    New Prescriptions New Prescriptions   No medications on file     Glynn Octaveancour, Levi Klaiber, MD 03/26/17 [phone]

## 2017-03-26 NOTE — ED Notes (Signed)
Pt complains of upper abd pain in the center and right side. Pt states this started at 0300 hrs this date and woke her up.

## 2017-03-26 NOTE — ED Notes (Signed)
OB rapid respond notified. 

## 2017-03-26 NOTE — Discharge Instructions (Signed)
Keep yourself hydrated.  Follow-up with your doctor.  Return to the ED if you develop new or worsening symptoms. °

## 2017-03-26 NOTE — ED Triage Notes (Signed)
Pt c/o 9/10 abd pain, with nausea and vomiting for the past 3 hours, no fever or chills, denies any vaginal discharge or bleeding pt is [redacted] weeks pregnant.

## 2017-04-24 ENCOUNTER — Ambulatory Visit (INDEPENDENT_AMBULATORY_CARE_PROVIDER_SITE_OTHER): Payer: Medicaid Other

## 2017-04-24 VITALS — BP 126/74 | HR 118 | Wt 183.1 lb

## 2017-04-24 DIAGNOSIS — O219 Vomiting of pregnancy, unspecified: Secondary | ICD-10-CM | POA: Diagnosis not present

## 2017-04-24 DIAGNOSIS — Z23 Encounter for immunization: Secondary | ICD-10-CM | POA: Diagnosis not present

## 2017-04-24 DIAGNOSIS — Z3400 Encounter for supervision of normal first pregnancy, unspecified trimester: Secondary | ICD-10-CM

## 2017-04-24 DIAGNOSIS — O0933 Supervision of pregnancy with insufficient antenatal care, third trimester: Secondary | ICD-10-CM | POA: Diagnosis not present

## 2017-04-24 MED ORDER — ONDANSETRON 8 MG PO TBDP: 8.0000 mg | ORAL_TABLET | Freq: Three times a day (TID) | ORAL | 2 refills | Status: DC | PRN

## 2017-04-24 NOTE — Patient Instructions (Signed)

## 2017-04-24 NOTE — Progress Notes (Signed)
   PRENATAL VISIT NOTE  Subjective:  Stacey [name] is a 31 y.o. G1P0 at 2440w6d being seen today for ongoing prenatal care.  She is currently monitored for the following issues for this low-risk pregnancy and has Supervision of low-risk first pregnancy; Nausea and vomiting in pregnancy prior to [redacted] weeks gestation; Needs flu shot; Screening, antenatal, for fetal anatomic survey; Sickle cell trait (HCC); and Alpha thalassemia trait on their problem list.  Patient reports no complaints.  Contractions: Not present. Vag. Bleeding: None.  Movement: Present. Denies leaking of fluid.   The following portions of the patient's history were reviewed and updated as appropriate: allergies, current medications, past family history, past medical history, past social history, past surgical history and problem list. Problem list updated.  Objective:   Vitals:   04/24/17 1027  BP: 126/74  Pulse: (!) 118  Weight: 183 lb 1.6 oz (83.1 kg)    Fetal Status: Fetal Heart Rate (bpm): 147   Movement: Present     General:  Alert, oriented and cooperative. Patient is in no acute distress.  Skin: Skin is warm and dry. No rash noted.   Cardiovascular: Normal heart rate noted  Respiratory: Normal respiratory effort, no problems with respiration noted  Abdomen: Soft, gravid, appropriate for gestational age.  Pain/Pressure: Absent     Pelvic: Cervical exam deferred        Extremities: Normal range of motion.  Edema: None  Mental Status:  Normal mood and affect. Normal behavior. Normal judgment and thought content.   Assessment and Plan:  Pregnancy: G1P0 at 6740w6d  1. Encounter for supervision of low-risk first pregnancy, antepartum -Patient declined GTT, not fasting and unable to stay for 1 hour -Lengthy discussion about importance of screening for GTT and keeping appointments. Discussed risks of uncontrolled DM in pregnancy including delayed fetal lung maturity, IUFD and shoulder dystocia possibly resulting brachial  plexus palsy, brain damage, intrapartum death and extensive obstetric lacerations. Patient verbalizes understanding. -Will schedule appointment this week for 2hr GTT  -Tdap today  2. Limited prenatal care in third trimester -Patient missed several appointments in last month. Emphasized the importance of coming to regular prenatal care  3. Nausea and vomiting in pregnancy  -Patient still having nausea and vomiting daily.  - ondansetron (ZOFRAN ODT) 8 MG disintegrating tablet; Take 1 tablet (8 mg total) by mouth every 8 (eight) hours as needed for nausea or vomiting.  Dispense: 30 tablet; Refill: 2  Preterm labor symptoms and general obstetric precautions including but not limited to vaginal bleeding, contractions, leaking of fluid and fetal movement were reviewed in detail with the patient. Please refer to After Visit Summary for other counseling recommendations.  Return in about 3 days (around 04/27/2017), or Fasting for 2hr GTT.   Rolm BookbinderCaroline M Ugo Thoma, CNM 04/24/17 10:44 AM

## 2017-04-27 ENCOUNTER — Other Ambulatory Visit: Payer: Medicaid Other

## 2017-04-27 DIAGNOSIS — Z3403 Encounter for supervision of normal first pregnancy, third trimester: Secondary | ICD-10-CM

## 2017-04-28 LAB — GLUCOSE TOLERANCE, 2 HOURS W/ 1HR
GLUCOSE, 1 HOUR: 132 mg/dL (ref 65–179)
Glucose, 2 hour: 114 mg/dL (ref 65–152)
Glucose, Fasting: 78 mg/dL (ref 65–91)

## 2017-05-13 ENCOUNTER — Inpatient Hospital Stay (HOSPITAL_COMMUNITY)
Admission: AD | Admit: 2017-05-13 | Discharge: 2017-05-13 | Disposition: A | Discharge status: Home / Self Care | Payer: Medicaid Other | Source: Ambulatory Visit | Attending: Obstetrics & Gynecology | Admitting: Obstetrics & Gynecology

## 2017-05-13 ENCOUNTER — Encounter (HOSPITAL_COMMUNITY): Payer: Self-pay | Admitting: *Deleted

## 2017-05-13 DIAGNOSIS — Z3A30 30 weeks gestation of pregnancy: Secondary | ICD-10-CM | POA: Insufficient documentation

## 2017-05-13 DIAGNOSIS — O219 Vomiting of pregnancy, unspecified: Secondary | ICD-10-CM

## 2017-05-13 DIAGNOSIS — O212 Late vomiting of pregnancy: Secondary | ICD-10-CM | POA: Insufficient documentation

## 2017-05-13 DIAGNOSIS — O4703 False labor before 37 completed weeks of gestation, third trimester: Secondary | ICD-10-CM | POA: Diagnosis not present

## 2017-05-13 DIAGNOSIS — O99013 Anemia complicating pregnancy, third trimester: Secondary | ICD-10-CM | POA: Diagnosis not present

## 2017-05-13 DIAGNOSIS — D573 Sickle-cell trait: Secondary | ICD-10-CM | POA: Insufficient documentation

## 2017-05-13 DIAGNOSIS — D563 Thalassemia minor: Secondary | ICD-10-CM

## 2017-05-13 DIAGNOSIS — R109 Unspecified abdominal pain: Secondary | ICD-10-CM | POA: Diagnosis present

## 2017-05-13 LAB — COMPREHENSIVE METABOLIC PANEL
ALBUMIN: 2.6 g/dL — AB (ref 3.5–5.0)
ALT: 33 U/L (ref 14–54)
ANION GAP: 8 (ref 5–15)
AST: 25 U/L (ref 15–41)
Alkaline Phosphatase: 255 U/L — ABNORMAL HIGH (ref 38–126)
BILIRUBIN TOTAL: 0.4 mg/dL (ref 0.3–1.2)
BUN: <5 mg/dL — ABNORMAL LOW (ref 6–20)
CHLORIDE: 104 mmol/L (ref 101–111)
CO2: 21 mmol/L — ABNORMAL LOW (ref 22–32)
Calcium: 8.8 mg/dL — ABNORMAL LOW (ref 8.9–10.3)
Creatinine, Ser: 0.8 mg/dL (ref 0.44–1.00)
GFR calc Af Amer: >60 mL/min (ref >60)
Glucose, Bld: 114 mg/dL — ABNORMAL HIGH (ref 65–99)
POTASSIUM: 3.7 mmol/L (ref 3.5–5.1)
Sodium: 133 mmol/L — ABNORMAL LOW (ref 135–145)
TOTAL PROTEIN: 7.1 g/dL (ref 6.5–8.1)

## 2017-05-13 LAB — URINALYSIS, ROUTINE W REFLEX MICROSCOPIC
BILIRUBIN URINE: NEGATIVE
GLUCOSE, UA: NEGATIVE mg/dL
HGB URINE DIPSTICK: NEGATIVE
Ketones, ur: NEGATIVE mg/dL
Leukocytes, UA: NEGATIVE
Nitrite: NEGATIVE
PROTEIN: NEGATIVE mg/dL
Specific Gravity, Urine: 1.009 (ref 1.005–1.030)
pH: 6 (ref 5.0–8.0)

## 2017-05-13 LAB — CBC WITH DIFFERENTIAL/PLATELET
BASOS ABS: 0 10*3/uL (ref 0.0–0.1)
BASOS PCT: 0 %
EOS PCT: 1 %
Eosinophils Absolute: 0.1 10*3/uL (ref 0.0–0.7)
HEMATOCRIT: 36.1 % (ref 36.0–46.0)
Hemoglobin: 12 g/dL (ref 12.0–15.0)
Lymphocytes Relative: 22 %
Lymphs Abs: 2 10*3/uL (ref 0.7–4.0)
MCH: 27.3 pg (ref 26.0–34.0)
MCHC: 33.2 g/dL (ref 30.0–36.0)
MCV: 82 fL (ref 78.0–100.0)
MONO ABS: 0.4 10*3/uL (ref 0.1–1.0)
MONOS PCT: 4 %
NEUTROS ABS: 6.5 10*3/uL (ref 1.7–7.7)
Neutrophils Relative %: 73 %
PLATELETS: 255 10*3/uL (ref 150–400)
RBC: 4.4 MIL/uL (ref 3.87–5.11)
RDW: 14.2 % (ref 11.5–15.5)
WBC: 8.9 10*3/uL (ref 4.0–10.5)

## 2017-05-13 LAB — AMYLASE: AMYLASE: 97 U/L (ref 28–100)

## 2017-05-13 LAB — FETAL FIBRONECTIN: FETAL FIBRONECTIN: NEGATIVE

## 2017-05-13 LAB — LIPASE, BLOOD: LIPASE: 21 U/L (ref 11–51)

## 2017-05-13 MED ORDER — NIFEDIPINE 10 MG PO CAPS: 10.0000 mg | ORAL_CAPSULE | Freq: Once | ORAL | Status: DC

## 2017-05-13 MED ORDER — NIFEDIPINE 10 MG PO CAPS
10.0000 mg | ORAL_CAPSULE | Freq: Once | ORAL | Status: AC
  Administered 2017-05-13: 10 mg via ORAL
  Filled 2017-05-13: qty 1

## 2017-05-13 NOTE — MAU Note (Signed)
Pt reports abd pain constantly for 4 days, denies bleeding or ROM

## 2017-05-13 NOTE — Progress Notes (Addendum)
G1 @ 30.[redacted] wksga. Presents to triage for constant pain for 4 days. Denies LOF or bleeding.   EFM applied.   1239: provider at bs assessing.   1406: 1st dose procardia given 1428: 2nd dose procardia given 1451: ordered for another procardia.   1453: None in pyxis. pharamacy notified to send.   1503: still waiting on med.   1535: 3rd dose procardia given. VSS see flow sheet for details of bp  1625: pt states feels much better. Pain level 2/10. Provider made aware.   1655: d/c instructions given with pt understanding. Pt left unit via ambulatory with SO.

## 2017-05-13 NOTE — Discharge Instructions (Signed)

## 2017-05-13 NOTE — MAU Provider Note (Signed)
Chief Complaint:  Abdominal Pain   First Provider Initiated Contact with Patient 05/13/17 1237     HPI: Stacey [name] is a 31 y.o. G1P0 at 4030w4dwho presents to maternity admissions reporting abdominal pain for several days.  Denies leaking or bleeding .  She reports good fetal movement, denies LOF, vaginal bleeding, vaginal itching/burning, urinary symptoms, h/a, dizziness, n/v, diarrhea, constipation or fever/chills.    Abdominal Pain  This is a new problem. The current episode started in the past 7 days. The onset quality is gradual. The problem occurs intermittently. The problem has been unchanged. The pain is located in the generalized abdominal region, LLQ and RLQ. The pain is moderate. The quality of the pain is cramping. The abdominal pain does not radiate. Pertinent negatives include no constipation, diarrhea, fever, headaches, myalgias, nausea or vomiting. Nothing aggravates the pain. The pain is relieved by nothing. She has tried nothing for the symptoms.    RN Note: Pt reports abd pain constantly for 4 days, denies bleeding or ROM          Electronically signed by Thornton ParkWeston, Lynnette W, RN at 05/13/2017 12:08 PM      Past Medical History: Past Medical History:  Diagnosis Date  . Medical history non-contributory     Past obstetric history: OB History  Gravida Para Term Preterm AB Living  1            SAB TAB Ectopic Multiple Live Births               # Outcome Date GA Lbr Len/2nd Weight Sex Delivery Anes PTL Lv  1 Current               Past Surgical History: Past Surgical History:  Procedure Laterality Date  . NO PAST SURGERIES      Family History: Family History  Problem Relation Age of Onset  . Hypertension Mother     Social History: Social History   Tobacco Use  . Smoking status: Never Smoker  . Smokeless tobacco: Never Used  Substance Use Topics  . Alcohol use: No  . Drug use: No    Allergies: No Known Allergies  Meds:  Medications Prior to  Admission  Medication Sig Dispense Refill Last Dose  . acetaminophen (TYLENOL) 325 MG tablet Take 650 mg by mouth every 6 (six) hours as needed for moderate pain.   Past Week at Unknown time  . doxylamine, Sleep, (UNISOM) 25 MG tablet Take 1 tablet (25 mg total) by mouth at bedtime as needed. (Patient taking differently: Take 25 mg by mouth at bedtime as needed for sleep. ) 30 tablet 0 Past Week at Unknown time  . HYDROcodone-acetaminophen (NORCO/VICODIN) 5-325 MG tablet Take 1 tablet by mouth every 4 (four) hours as needed. (Patient taking differently: Take 1 tablet by mouth every 4 (four) hours as needed for moderate pain or severe pain. ) 10 tablet 0 05/12/2017 at Unknown time  . ondansetron (ZOFRAN ODT) 8 MG disintegrating tablet Take 1 tablet (8 mg total) by mouth every 8 (eight) hours as needed for nausea or vomiting. 30 tablet 2 05/12/2017 at Unknown time  . Prenatal Multivit-Min-Fe-FA (PRENATAL VITAMINS PO) Take 1 tablet by mouth daily.    05/12/2017 at Unknown time  . promethazine (PHENERGAN) 25 MG tablet Take 1 tablet (25 mg total) by mouth every 6 (six) hours as needed for nausea or vomiting. 30 tablet 0 05/12/2017 at Unknown time    I have reviewed patient's Past Medical Hx, Surgical  Hx, Family Hx, Social Hx, medications and allergies.   ROS:  Review of Systems  Constitutional: Negative for fever.  Gastrointestinal: Positive for abdominal pain. Negative for constipation, diarrhea, nausea and vomiting.  Musculoskeletal: Negative for myalgias.  Neurological: Negative for headaches.   Other systems negative  Physical Exam   Patient Vitals for the past 24 hrs:  BP Temp Temp src Pulse Resp SpO2 Height Weight  05/13/17 1430 122/76 - - (!) 112 - 98 % - -  05/13/17 1428 122/76 - - - - - - -  05/13/17 1209 (!) 125/54 98.2 F (36.8 C) Oral (!) 116 16 99 % 5\' 6"  (1.676 m) 188 lb (85.3 kg)   Constitutional: Well-developed, well-nourished female in no acute distress.  Cardiovascular:  normal rate and rhythm Respiratory: normal effort, clear to auscultation bilaterally GI: Abd soft, moderately tender over lower abdomen and somewhat over upper abdomen, gravid appropriate for gestational age.   No rebound or guarding. MS: Extremities nontender, no edema, normal ROM Neurologic: Alert and oriented x 4.  GU: Neg CVAT.  PELVIC EXAM:  Dilation: Fingertip Effacement (%): Thick Exam by:: CNM Bascom LevelsMarie  FHT:  Baseline 140 , moderate variability, accelerations present, no decelerations Contractions: q 4-5 mins Irregular     Labs: Results for orders placed or performed during the hospital encounter of 05/13/17 (from the past 24 hour(s))  Urinalysis, Routine w reflex microscopic     Status: None   Collection Time: 05/13/17 12:10 PM  Result Value Ref Range   Color, Urine YELLOW YELLOW   APPearance CLEAR CLEAR   Specific Gravity, Urine 1.009 1.005 - 1.030   pH 6.0 5.0 - 8.0   Glucose, UA NEGATIVE NEGATIVE mg/dL   Hgb urine dipstick NEGATIVE NEGATIVE   Bilirubin Urine NEGATIVE NEGATIVE   Ketones, ur NEGATIVE NEGATIVE mg/dL   Protein, ur NEGATIVE NEGATIVE mg/dL   Nitrite NEGATIVE NEGATIVE   Leukocytes, UA NEGATIVE NEGATIVE  Fetal fibronectin     Status: None   Collection Time: 05/13/17 12:44 PM  Result Value Ref Range   Fetal Fibronectin NEGATIVE NEGATIVE  CBC with Differential/Platelet     Status: None   Collection Time: 05/13/17 12:48 PM  Result Value Ref Range   WBC 8.9 4.0 - 10.5 K/uL   RBC 4.40 3.87 - 5.11 MIL/uL   Hemoglobin 12.0 12.0 - 15.0 g/dL   HCT 14.736.1 82.936.0 - 56.246.0 %   MCV 82.0 78.0 - 100.0 fL   MCH 27.3 26.0 - 34.0 pg   MCHC 33.2 30.0 - 36.0 g/dL   RDW 13.014.2 86.511.5 - 78.415.5 %   Platelets 255 150 - 400 K/uL   Neutrophils Relative % 73 %   Neutro Abs 6.5 1.7 - 7.7 K/uL   Lymphocytes Relative 22 %   Lymphs Abs 2.0 0.7 - 4.0 K/uL   Monocytes Relative 4 %   Monocytes Absolute 0.4 0.1 - 1.0 K/uL   Eosinophils Relative 1 %   Eosinophils Absolute 0.1 0.0 - 0.7 K/uL     Basophils Relative 0 %   Basophils Absolute 0.0 0.0 - 0.1 K/uL  Comprehensive metabolic panel     Status: Abnormal   Collection Time: 05/13/17 12:48 PM  Result Value Ref Range   Sodium 133 (L) 135 - 145 mmol/L   Potassium 3.7 3.5 - 5.1 mmol/L   Chloride 104 101 - 111 mmol/L   CO2 21 (L) 22 - 32 mmol/L   Glucose, Bld 114 (H) 65 - 99 mg/dL   BUN <5 (L)  6 - 20 mg/dL   Creatinine, Ser 4.09 0.44 - 1.00 mg/dL   Calcium 8.8 (L) 8.9 - 10.3 mg/dL   Total Protein 7.1 6.5 - 8.1 g/dL   Albumin 2.6 (L) 3.5 - 5.0 g/dL   AST 25 15 - 41 U/L   ALT 33 14 - 54 U/L   Alkaline Phosphatase 255 (H) 38 - 126 U/L   Total Bilirubin 0.4 0.3 - 1.2 mg/dL   GFR calc non Af Amer >60 >60 mL/min   GFR calc Af Amer >60 >60 mL/min   Anion gap 8 5 - 15  Amylase     Status: None   Collection Time: 05/13/17 12:48 PM  Result Value Ref Range   Amylase 97 28 - 100 U/L  Lipase, blood     Status: None   Collection Time: 05/13/17 12:48 PM  Result Value Ref Range   Lipase 21 11 - 51 U/L   B/Positive/-- (09/11 1114)  Imaging:  No results found.  MAU Course/MDM: I have ordered labs and reviewed results. Added chemistries due to past elevated Lipase.  Added CBC to check for elevation in WBC.  These were all normal.  Urine is dilute, reflecting good hydration NST reviewed and found to be reactive.  Is having some uterine contractions.   Treatments in MAU included Procardia and PO hydration .  1406 then at 1428, then at 1535. Finally got mostly resolution after third dose. Still had some UCs on monitor but stated they were down to a "2"  Fetal fibronectin was negative so I did not give Steroids.   Assessment: 1. Sickle cell trait (HCC)   2. Alpha thalassemia trait   3. Nausea and vomiting in pregnancy prior to [redacted] weeks gestation   4.   SIUP at [redacted]w[redacted]d 5.    Preterm uterine contractions with negative fetal fibronectin and FT/Long cervix  Plan: Discharge home Preterm Labor precautions and fetal kick  counts Follow up in Office for prenatal visits and recheck of cervix  Encouraged to return here or to other Urgent Care/ED if she develops worsening of symptoms, increase in pain, fever, or other concerning symptoms.   Pt stable at time of discharge.  Wynelle Bourgeois CNM, MSN Certified Nurse-Midwife 05/13/2017 2:43 PM

## 2017-05-21 ENCOUNTER — Encounter: Payer: Self-pay | Admitting: Certified Nurse Midwife

## 2017-05-23 ENCOUNTER — Encounter: Payer: Medicaid Other | Admitting: Student

## 2017-05-25 ENCOUNTER — Encounter: Payer: Self-pay | Admitting: Certified Nurse Midwife

## 2017-05-25 ENCOUNTER — Encounter: Payer: Self-pay | Admitting: General Practice

## 2017-05-25 ENCOUNTER — Ambulatory Visit (INDEPENDENT_AMBULATORY_CARE_PROVIDER_SITE_OTHER): Payer: Medicaid Other | Admitting: Certified Nurse Midwife

## 2017-05-25 VITALS — BP 123/80 | HR 125 | Wt 185.4 lb

## 2017-05-25 DIAGNOSIS — Z3400 Encounter for supervision of normal first pregnancy, unspecified trimester: Secondary | ICD-10-CM

## 2017-05-25 NOTE — Progress Notes (Signed)
   PRENATAL VISIT NOTE  Subjective:  Stacey [name] is a 31 y.o. G1P0 at 681w2d being seen today for ongoing prenatal care.  She is currently monitored for the following issues for this low-risk pregnancy and has Supervision of low-risk first pregnancy; Nausea and vomiting in pregnancy prior to [redacted] weeks gestation; Needs flu shot; Screening, antenatal, for fetal anatomic survey; Sickle cell trait (HCC); and Alpha thalassemia trait on their problem list.  Patient reports occasional contractions. Patient states they are CSX CorporationBraxton Hicks contractions- not currently contracting. She reports cramping yesterday, states they were not painful and less frequent than when she went to MAU on 12/16. She reports that contractions were once every 20 minutes.  Contractions: Irregular. Vag. Bleeding: None.  Movement: Present. Denies leaking of fluid.   The following portions of the patient's history were reviewed and updated as appropriate: allergies, current medications, past family history, past medical history, past social history, past surgical history and problem list. Problem list updated.  Objective:   Vitals:   05/25/17 1029  BP: 123/80  Pulse: (!) 125  Weight: 185 lb 6.4 oz (84.1 kg)    Fetal Status: Fetal Heart Rate (bpm): 147 Fundal Height: 33 cm Movement: Present     General:  Alert, oriented and cooperative. Patient is in no acute distress.  Skin: Skin is warm and dry. No rash noted.   Cardiovascular: Normal heart rate noted  Respiratory: Normal respiratory effort, no problems with respiration noted  Abdomen: Soft, gravid, appropriate for gestational age.  Pain/Pressure: Present     Pelvic: Cervical exam deferred        Extremities: Normal range of motion.  Edema: None  Mental Status:  Normal mood and affect. Normal behavior. Normal judgment and thought content.   Assessment and Plan:  Pregnancy: G1P0 at 4881w2d  1. Encounter for supervision of low-risk first pregnancy, antepartum -Discussed  urgency of being seen in MAU for contractions that are frequent and/or painful. Visit in MAU on 12/16 where she received 3 doses of procardia.  -Patient reports no contractions today.   Preterm labor symptoms and general obstetric precautions including but not limited to vaginal bleeding, contractions, leaking of fluid and fetal movement were reviewed in detail with the patient. Please refer to After Visit Summary for other counseling recommendations.  Return in about 2 weeks (around 06/08/2017) for ROB.   Sharyon CableVeronica C Matilda Fleig, CNM

## 2017-05-25 NOTE — Patient Instructions (Signed)
Places to have your son circumcised:    [facility] [phone] $480 while you are in hospital  [facility] [phone] $244 by 4 wks  [facility] [phone] $[phone] by 2 wks  [facility] [phone] $[phone] by 7 days [facility] [phone] $[phone] by 4 wks  These prices sometimes change but are roughly what you can expect to pay. Please call and confirm pricing.   Circumcision is considered an elective/non-medically necessary procedure. There are many reasons parents decide to have their sons circumsized. During the first year of life circumcised males have a reduced risk of urinary tract infections but after this year the rates between circumcised males and uncircumcised males are the same.  It is safe to have your son circumcised outside of the hospital and the places above perform them regularly.  AREA PEDIATRIC/FAMILY PRACTICE PHYSICIANS  [facility] [address], [address] [place], [place]  [address] Phone - [phone]   Fax - [phone]  [facility] [address] [place], [place] [address] Phone - [phone]   Fax - [phone]  [facility] [address] [place], [place]  [address] Phone - [phone]   Fax - [phone]  [facility] [address] [place], [place]  [address] Phone - [phone]   Fax - [phone]  [facility] [address] [place], [place]  [address] Phone - [phone]   Fax - [phone]  [facility] [address], [address] [place], [place]  [address] Phone - [phone]   Fax - [phone]  [facility] [address] [place], [place]  [address] Phone - [phone]   Fax - [phone]  [facility] [address], [address] [place], [place]  [address] Phone - [phone]   Fax - [phone]  [facility] [address] [place], [place]  [address] Phone - [phone]   Fax - [phone] [facility] [address] [place], [place]  [address] Phone - [phone]   Fax - [phone]  [facility] [address] [place], [place]  [address] Phone - [phone]   Fax - [phone]  [facility] [address] [place], [place]  [address] Phone - [phone]   Fax - [phone]  [facility] [address] [place], [place]  [address] Phone - [phone]   Fax - [phone]  [facility] [address] [place], [place]  [address] Phone - [phone]  [facility] [address] [place], [place]  [address] Phone - [phone]   Fax - [phone]  [facility] [address] [place], [place]  [address] Phone - [phone]   Fax - [phone]  [facility] [address] [place], [place]  [address] Phone - [phone]   Fax - [phone]  [facility] [address] [place], [place]  [address] Phone - [phone]   Fax - [phone]   [facility] [address] [place], [place]  [address] Phone - [phone]   Fax - [phone]  [facility] [address] , [place]  [address] Phone - [phone]   Fax - [phone]  [facility] [address] , [place]  [address] Phone - [phone]   Fax - [phone]  [facility] [address] [place], [place]  [address] Phone - [phone]   Fax - [phone]  [facility] [address] [place], [place]  [address] Phone - [phone]   Fax - [phone] [facility] [address] [place], [place]  [address] Phone - [phone]   Fax - [phone]  [facility] [address] [place], [place]  [address] Phone - [phone]   Fax - [phone]  [facility] [address] [place], [place]  [address] Phone - [phone]   Fax - [phone]  [facility] Charlene Flemming MD [address] [place] [address] Phone [phone]  Fax [phone]   

## 2017-05-29 NOTE — L&D Delivery Note (Signed)
Delivery Note At 8:44 PM a viable female was delivered via  (Presentation: vertex; ROA) over an intact perineum. Head delivered ROA. No nuchal cord present. Shoulder and body delivered in usual fashion. Infant placed on mother's abdomen, dried and bulb suctioned. Cord clamped x 2 after 1-minute delay, and cut by family member. Cord blood drawn. After 1 minute, the cord was clamped and cut. 40 units of pitocin diluted in 1000cc LR was infused rapidly IV.  The placenta separated spontaneously and delivered via CCT and maternal pushing effort.  It was inspected and appears to be intact with a 3 VC.    APGAR: 8,9; weight pending.   Placenta status: intact, sent to L&D.  Cord: 3V without complications.  Cord pH: not sent.  Anesthesia: n/a  Episiotomy:  n/a Lacerations:  Superficial L periurethral, small bleeder at introitus, hemostatic Suture Repair: n/a Est. Blood Loss (mL):  100  Mom to postpartum.  Baby to Couplet care / Skin to Skin.  Alroy Bailiffarker W [place] 06/27/2017, 8:58 PM  I confirm that I have verified the information documented in the resident's note and that I have also personally reperformed the physical exam and all medical decision making activities.  I was gloved and present for entire delivery SVD without incident No difficulty with shoulders Lacerations as listed above Repair not indicated.  Hemostasis achieved with pressure There is a 1cm round nodule at right introitus, ?cyst vs small hematoma, not enlarging Aviva SignsMarie L Tymika Grilli, CNM  Please schedule this patient for Postpartum visit in: 1 week with the following provider: Any provider For C/S patients schedule nurse incision check in weeks 2 weeks: no High risk pregnancy complicated by: HTN Delivery mode:  SVD Anticipated Birth Control:  other/unsure PP Procedures needed: BP check  Schedule Integrated BH visit: no

## 2017-06-07 ENCOUNTER — Ambulatory Visit (INDEPENDENT_AMBULATORY_CARE_PROVIDER_SITE_OTHER): Payer: Medicaid Other | Admitting: Certified Nurse Midwife

## 2017-06-07 ENCOUNTER — Encounter: Payer: Self-pay | Admitting: Certified Nurse Midwife

## 2017-06-07 VITALS — BP 118/73 | HR 118 | Wt 187.8 lb

## 2017-06-07 DIAGNOSIS — Z3400 Encounter for supervision of normal first pregnancy, unspecified trimester: Secondary | ICD-10-CM

## 2017-06-07 NOTE — Patient Instructions (Signed)

## 2017-06-07 NOTE — Progress Notes (Signed)
   PRENATAL VISIT NOTE  Subjective:  Stacey Stacey [name] is a 32 y.o. G1P0 at 3232w1d being seen today for ongoing prenatal care.  Stacey Stacey [name]; Nausea and vomiting in Stacey [name] prior to [redacted] weeks gestation; Needs flu shot; Screening, antenatal, for fetal anatomic survey; Sickle cell trait (HCC); and Alpha thalassemia trait on their problem list.  Patient reports short of breath occasionally with movement and laying down.  Contractions: Irritability. Vag. Bleeding: None.  Movement: Present. Denies leaking of fluid.   The following portions of the patient's history were reviewed and updated as appropriate: allergies, current medications, past family history, past medical history, past social history, past surgical history and problem list. Problem list updated.  Objective:   Vitals:   06/07/17 1006  BP: 118/73  Pulse: (!) 118  Weight: 187 lb 12.8 oz (85.2 kg)    Fetal Status: Fetal Heart Rate (bpm): 154 Fundal Height: 35 cm Movement: Present     General:  Alert, oriented and cooperative. Patient is in no acute distress.  Skin: Skin is warm and dry. No rash noted.   Cardiovascular: Normal heart rate noted  Respiratory: Normal respiratory effort, no problems with respiration noted, no rales or wheezes noted, normal breath sounds bilaterally   Abdomen: Soft, gravid, appropriate for gestational age.  Pain/Pressure: Present     Pelvic: Cervical exam deferred        Extremities: Normal range of motion.  Edema: None  Mental Status:  Normal mood and affect. Normal behavior. Normal judgment and thought content.   Assessment and Plan:  Stacey [name]: G1P0 at 3232w1d  1. Encounter for supervision of low-risk first Stacey [name], antepartum -Feels well, discussed physiology of lung expansion during Stacey [name] due to growth of uterus to diaphragm, discussed elevation of head when laying down to help  with breathing and changing positions slowly to give body time to adjust -Anticipatory guidance on GBS screening at next visit  - RPR - HIV antibody  Preterm labor symptoms and general obstetric precautions including but not limited to vaginal bleeding, contractions, leaking of fluid and fetal movement were reviewed in detail with the patient. Please refer to After Visit Summary for other counseling recommendations.  Return in about 2 weeks (around 06/21/2017) for ROB.   Sharyon CableVeronica C Bengie Kaucher, CNM

## 2017-06-07 NOTE — Progress Notes (Signed)
Pt states has been experiencing breathing problems, has to slow breathing down & breath deeply to get relief.

## 2017-06-08 LAB — RPR: RPR Ser Ql: NONREACTIVE

## 2017-06-08 LAB — HIV ANTIBODY (ROUTINE TESTING W REFLEX): HIV Screen 4th Generation wRfx: NONREACTIVE

## 2017-06-22 ENCOUNTER — Encounter: Payer: Self-pay | Admitting: Medical

## 2017-06-22 ENCOUNTER — Other Ambulatory Visit (HOSPITAL_COMMUNITY)
Admission: RE | Admit: 2017-06-22 | Discharge: 2017-06-22 | Disposition: A | Discharge status: Home / Self Care | Payer: Medicaid Other | Source: Ambulatory Visit | Attending: Medical | Admitting: Medical

## 2017-06-22 ENCOUNTER — Ambulatory Visit (INDEPENDENT_AMBULATORY_CARE_PROVIDER_SITE_OTHER): Payer: Medicaid Other | Admitting: Medical

## 2017-06-22 VITALS — BP 112/74 | HR 115 | Wt 190.4 lb

## 2017-06-22 DIAGNOSIS — Z3490 Encounter for supervision of normal pregnancy, unspecified, unspecified trimester: Secondary | ICD-10-CM

## 2017-06-22 DIAGNOSIS — Z3403 Encounter for supervision of normal first pregnancy, third trimester: Secondary | ICD-10-CM | POA: Insufficient documentation

## 2017-06-22 DIAGNOSIS — Z3493 Encounter for supervision of normal pregnancy, unspecified, third trimester: Secondary | ICD-10-CM

## 2017-06-22 NOTE — Patient Instructions (Signed)

## 2017-06-22 NOTE — Progress Notes (Signed)
   PRENATAL VISIT NOTE  Subjective:  Stacey [name] is a 32 y.o. G1P0 at 565w2d being seen today for ongoing prenatal care.  She is currently monitored for the following issues for this high-risk pregnancy and has Supervision of low-risk first pregnancy; Nausea and vomiting in pregnancy prior to [redacted] weeks gestation; Needs flu shot; Screening, antenatal, for fetal anatomic survey; Sickle cell trait (HCC); and Alpha thalassemia trait on their problem list.  Patient reports occasional contractions.  Contractions: Irregular. Vag. Bleeding: None.  Movement: Present. Denies leaking of fluid.   The following portions of the patient's history were reviewed and updated as appropriate: allergies, current medications, past family history, past medical history, past social history, past surgical history and problem list. Problem list updated.  Objective:   Vitals:   06/22/17 1029  BP: 112/74  Pulse: (!) 115  Weight: 190 lb 6.4 oz (86.4 kg)    Fetal Status: Fetal Heart Rate (bpm): 147 Fundal Height: 36 cm Movement: Present     General:  Alert, oriented and cooperative. Patient is in no acute distress.  Skin: Skin is warm and dry. No rash noted.   Cardiovascular: Normal heart rate noted  Respiratory: Normal respiratory effort, no problems with respiration noted  Abdomen: Soft, gravid, appropriate for gestational age.  Pain/Pressure: Present     Pelvic: Cervical exam performed Dilation: Closed Effacement (%): Thick Station: Ballotable  Extremities: Normal range of motion.  Edema: None  Mental Status:  Normal mood and affect. Normal behavior. Normal judgment and thought content.   Assessment and Plan:  Pregnancy: G1P0 at 865w2d  1. Encounter for supervision of low-risk pregnancy, antepartum - Culture, beta strep (group b only) - GC/Chlamydia probe amp ([place]  Preterm labor symptoms and general obstetric precautions including but not limited to vaginal bleeding, contractions,  leaking of fluid and fetal movement were reviewed in detail with the patient. Please refer to After Visit Summary for other counseling recommendations.  Return in about 1 week (around 06/29/2017) for LOB.   Vonzella NippleJulie Marrah Vanevery, PA-C

## 2017-06-25 ENCOUNTER — Encounter: Payer: Self-pay | Admitting: Medical

## 2017-06-25 DIAGNOSIS — O98819 Other maternal infectious and parasitic diseases complicating pregnancy, unspecified trimester: Secondary | ICD-10-CM

## 2017-06-25 DIAGNOSIS — B951 Streptococcus, group B, as the cause of diseases classified elsewhere: Secondary | ICD-10-CM | POA: Insufficient documentation

## 2017-06-25 LAB — GC/CHLAMYDIA PROBE AMP (~~LOC~~) NOT AT ARMC
CHLAMYDIA, DNA PROBE: NEGATIVE
NEISSERIA GONORRHEA: NEGATIVE

## 2017-06-25 LAB — CULTURE, BETA STREP (GROUP B ONLY): STREP GP B CULTURE: POSITIVE — AB

## 2017-06-27 ENCOUNTER — Encounter (HOSPITAL_COMMUNITY): Payer: Self-pay

## 2017-06-27 ENCOUNTER — Other Ambulatory Visit: Payer: Self-pay

## 2017-06-27 ENCOUNTER — Encounter (HOSPITAL_COMMUNITY): Payer: Self-pay | Admitting: Certified Registered Nurse Anesthetist

## 2017-06-27 ENCOUNTER — Inpatient Hospital Stay (HOSPITAL_COMMUNITY)
Admission: AD | Admit: 2017-06-27 | Discharge: 2017-06-29 | DRG: 807 | Disposition: A | Discharge status: Home / Self Care | Payer: Medicaid Other | Source: Ambulatory Visit | Attending: Obstetrics & Gynecology | Admitting: Obstetrics & Gynecology

## 2017-06-27 DIAGNOSIS — O99824 Streptococcus B carrier state complicating childbirth: Secondary | ICD-10-CM | POA: Diagnosis present

## 2017-06-27 DIAGNOSIS — Z3A37 37 weeks gestation of pregnancy: Secondary | ICD-10-CM

## 2017-06-27 DIAGNOSIS — D573 Sickle-cell trait: Secondary | ICD-10-CM | POA: Diagnosis present

## 2017-06-27 DIAGNOSIS — O219 Vomiting of pregnancy, unspecified: Secondary | ICD-10-CM

## 2017-06-27 DIAGNOSIS — O9902 Anemia complicating childbirth: Secondary | ICD-10-CM | POA: Diagnosis present

## 2017-06-27 DIAGNOSIS — O98819 Other maternal infectious and parasitic diseases complicating pregnancy, unspecified trimester: Secondary | ICD-10-CM

## 2017-06-27 DIAGNOSIS — D563 Thalassemia minor: Secondary | ICD-10-CM | POA: Diagnosis present

## 2017-06-27 DIAGNOSIS — O133 Gestational [pregnancy-induced] hypertension without significant proteinuria, third trimester: Secondary | ICD-10-CM

## 2017-06-27 DIAGNOSIS — O134 Gestational [pregnancy-induced] hypertension without significant proteinuria, complicating childbirth: Secondary | ICD-10-CM | POA: Diagnosis present

## 2017-06-27 DIAGNOSIS — Z3400 Encounter for supervision of normal first pregnancy, unspecified trimester: Secondary | ICD-10-CM

## 2017-06-27 DIAGNOSIS — R030 Elevated blood-pressure reading, without diagnosis of hypertension: Secondary | ICD-10-CM | POA: Diagnosis present

## 2017-06-27 DIAGNOSIS — Z3490 Encounter for supervision of normal pregnancy, unspecified, unspecified trimester: Secondary | ICD-10-CM

## 2017-06-27 DIAGNOSIS — B951 Streptococcus, group B, as the cause of diseases classified elsewhere: Secondary | ICD-10-CM | POA: Diagnosis present

## 2017-06-27 LAB — COMPREHENSIVE METABOLIC PANEL
ALBUMIN: 3 g/dL — AB (ref 3.5–5.0)
ALT: 34 U/L (ref 14–54)
ANION GAP: 12 (ref 5–15)
AST: 56 U/L — ABNORMAL HIGH (ref 15–41)
Alkaline Phosphatase: 371 U/L — ABNORMAL HIGH (ref 38–126)
BILIRUBIN TOTAL: 0.6 mg/dL (ref 0.3–1.2)
BUN: 7 mg/dL (ref 6–20)
CO2: 17 mmol/L — AB (ref 22–32)
Calcium: 9.1 mg/dL (ref 8.9–10.3)
Chloride: 102 mmol/L (ref 101–111)
Creatinine, Ser: 0.91 mg/dL (ref 0.44–1.00)
GFR calc Af Amer: >60 mL/min (ref >60)
GFR calc non Af Amer: >60 mL/min (ref >60)
GLUCOSE: 118 mg/dL — AB (ref 65–99)
POTASSIUM: 3.4 mmol/L — AB (ref 3.5–5.1)
SODIUM: 131 mmol/L — AB (ref 135–145)
TOTAL PROTEIN: 7.9 g/dL (ref 6.5–8.1)

## 2017-06-27 LAB — CBC
HCT: 38.9 % (ref 36.0–46.0)
Hemoglobin: 13.6 g/dL (ref 12.0–15.0)
MCH: 27.5 pg (ref 26.0–34.0)
MCHC: 35 g/dL (ref 30.0–36.0)
MCV: 78.7 fL (ref 78.0–100.0)
PLATELETS: 265 10*3/uL (ref 150–400)
RBC: 4.94 MIL/uL (ref 3.87–5.11)
RDW: 14.5 % (ref 11.5–15.5)
WBC: 11 10*3/uL — ABNORMAL HIGH (ref 4.0–10.5)

## 2017-06-27 LAB — ABO/RH: ABO/RH(D): B POS

## 2017-06-27 LAB — TYPE AND SCREEN
ABO/RH(D): B POS
Antibody Screen: NEGATIVE

## 2017-06-27 MED ORDER — PRENATAL MULTIVITAMIN CH
1.0000 | ORAL_TABLET | Freq: Every day | ORAL | Status: DC
  Administered 2017-06-28 – 2017-06-29 (×2): 1 via ORAL
  Filled 2017-06-27 (×2): qty 1

## 2017-06-27 MED ORDER — ZOLPIDEM TARTRATE 5 MG PO TABS: 5.0000 mg | ORAL_TABLET | Freq: Every evening | ORAL | Status: DC | PRN

## 2017-06-27 MED ORDER — OXYCODONE-ACETAMINOPHEN 5-325 MG PO TABS: 2.0000 | ORAL_TABLET | ORAL | Status: DC | PRN

## 2017-06-27 MED ORDER — FENTANYL CITRATE (PF) 100 MCG/2ML IJ SOLN
100.0000 ug | INTRAMUSCULAR | Status: DC | PRN
  Administered 2017-06-27: 100 ug via INTRAVENOUS
  Filled 2017-06-27: qty 2

## 2017-06-27 MED ORDER — LIDOCAINE HCL (PF) 1 % IJ SOLN
30.0000 mL | INTRAMUSCULAR | Status: DC | PRN
  Filled 2017-06-27: qty 30

## 2017-06-27 MED ORDER — DIPHENHYDRAMINE HCL 25 MG PO CAPS: 25.0000 mg | ORAL_CAPSULE | Freq: Four times a day (QID) | ORAL | Status: DC | PRN

## 2017-06-27 MED ORDER — AMPICILLIN SODIUM 2 G IJ SOLR
2.0000 g | Freq: Once | INTRAMUSCULAR | Status: AC
  Administered 2017-06-27: 2 g via INTRAVENOUS
  Filled 2017-06-27: qty 2000

## 2017-06-27 MED ORDER — WITCH HAZEL-GLYCERIN EX PADS: 1.0000 "application " | MEDICATED_PAD | CUTANEOUS | Status: DC | PRN

## 2017-06-27 MED ORDER — SOD CITRATE-CITRIC ACID 500-334 MG/5ML PO SOLN: 30.0000 mL | ORAL | Status: DC | PRN

## 2017-06-27 MED ORDER — ACETAMINOPHEN 325 MG PO TABS: 650.0000 mg | ORAL_TABLET | ORAL | Status: DC | PRN

## 2017-06-27 MED ORDER — OXYTOCIN BOLUS FROM INFUSION
500.0000 mL | Freq: Once | INTRAVENOUS | Status: AC
  Administered 2017-06-27: 500 mL via INTRAVENOUS

## 2017-06-27 MED ORDER — SIMETHICONE 80 MG PO CHEW: 80.0000 mg | CHEWABLE_TABLET | ORAL | Status: DC | PRN

## 2017-06-27 MED ORDER — IBUPROFEN 600 MG PO TABS
600.0000 mg | ORAL_TABLET | Freq: Four times a day (QID) | ORAL | Status: DC
  Administered 2017-06-27 – 2017-06-29 (×7): 600 mg via ORAL
  Filled 2017-06-27 (×7): qty 1

## 2017-06-27 MED ORDER — OXYCODONE-ACETAMINOPHEN 5-325 MG PO TABS: 1.0000 | ORAL_TABLET | ORAL | Status: DC | PRN

## 2017-06-27 MED ORDER — ACETAMINOPHEN 325 MG PO TABS
650.0000 mg | ORAL_TABLET | ORAL | Status: DC | PRN
  Filled 2017-06-27: qty 2

## 2017-06-27 MED ORDER — BENZOCAINE-MENTHOL 20-0.5 % EX AERO
1.0000 "application " | INHALATION_SPRAY | CUTANEOUS | Status: DC | PRN
  Administered 2017-06-27: 1 via TOPICAL
  Filled 2017-06-27: qty 56

## 2017-06-27 MED ORDER — TETANUS-DIPHTH-ACELL PERTUSSIS 5-2.5-18.5 LF-MCG/0.5 IM SUSP: 0.5000 mL | Freq: Once | INTRAMUSCULAR | Status: DC

## 2017-06-27 MED ORDER — ONDANSETRON HCL 4 MG/2ML IJ SOLN: 4.0000 mg | INTRAMUSCULAR | Status: DC | PRN

## 2017-06-27 MED ORDER — DIBUCAINE 1 % RE OINT: 1.0000 "application " | TOPICAL_OINTMENT | RECTAL | Status: DC | PRN

## 2017-06-27 MED ORDER — ONDANSETRON HCL 4 MG PO TABS: 4.0000 mg | ORAL_TABLET | ORAL | Status: DC | PRN

## 2017-06-27 MED ORDER — COCONUT OIL OIL: 1.0000 "application " | TOPICAL_OIL | Status: DC | PRN

## 2017-06-27 MED ORDER — ONDANSETRON HCL 4 MG/2ML IJ SOLN: 4.0000 mg | Freq: Four times a day (QID) | INTRAMUSCULAR | Status: DC | PRN

## 2017-06-27 MED ORDER — LACTATED RINGERS IV SOLN: 500.0000 mL | INTRAVENOUS | Status: DC | PRN

## 2017-06-27 MED ORDER — LACTATED RINGERS IV SOLN
INTRAVENOUS | Status: DC
  Administered 2017-06-27: 20:00:00 via INTRAVENOUS

## 2017-06-27 MED ORDER — SENNOSIDES-DOCUSATE SODIUM 8.6-50 MG PO TABS
2.0000 | ORAL_TABLET | ORAL | Status: DC
  Administered 2017-06-27 – 2017-06-28 (×2): 2 via ORAL
  Filled 2017-06-27 (×2): qty 2

## 2017-06-27 MED ORDER — OXYTOCIN 40 UNITS IN LACTATED RINGERS INFUSION - SIMPLE MED
2.5000 [IU]/h | INTRAVENOUS | Status: DC
  Administered 2017-06-27: 2.5 [IU]/h via INTRAVENOUS
  Filled 2017-06-27: qty 1000

## 2017-06-27 NOTE — Anesthesia Pain Management Evaluation Note (Signed)
  CRNA Pain Management Visit Note  Patient: Harriette OharaJudith Cassel, 32 y.o., female  "Hello I am a [facility] of the anesthesia team at [facility]. We have an anesthesia team available at all times to provide care throughout the hospital, including epidural management and anesthesia for C-section. I don't know your plan for the delivery whether it a natural birth, water birth, IV sedation, nitrous supplementation, doula or epidural, but we want to meet your pain goals."   1.Was your pain managed to your expectations on prior hospitalizations?   Unable to assess - patient sleeping  2.What is your expectation for pain management during this hospitalization?     IV pain meds  3.How can we help you reach that goal? supprt and availability  Record the patient's initial score and the patient's pain goal.   Pain: 10  Pain Goal: 10 [facility] wants you to be able to say your pain was always managed very well.  Trellis PaganiniBREWER,Donold Marotto N 06/27/2017

## 2017-06-27 NOTE — H&P (Signed)
OBSTETRIC ADMISSION HISTORY AND PHYSICAL  Stacey OharaJudith [name] is a 32 y.o. female G1P0 with IUP at 6241w0d by LMP presenting for SOL and elevated BP. She reports +FMs, No LOF, no VB, no blurry vision, headaches or peripheral edema, and RUQ pain.  She plans on breast feeding. She request nothing for birth control. [facility]   Dating: By LMP --->  Estimated Date of Delivery: 07/18/17  Sono: 03/23/17   @[redacted]w[redacted]d , normal anatomy, cephalic presentation, fundal, 520g, 37% EFW  Clinic [facility] Prenatal Labs  Dating LMP Blood type:   A pos  Genetic Screen 1 Screen:    AFP:     Quad:     NIPS: Antibody: neg  Anatomic US  Normal Female  Rubella:  non reactive  GTT Early:n/a               Third trimester: 78-132-114 RPR:   non reactive  Flu vaccine 02/06/17 HBsAg:     TDaP vaccine  04/24/17                                      Rhogam: HIV:   non reactive  Baby Food  Breast feed                                              GBS: POSITIVE  Contraception  None Pap: Done 02/06/17  Circumcision  Yes- list given    Pediatrician  None yet- list given    Support Person  Stacey ConroyDanielle [name] (partner) Alpha thalassemia: carrier  Prenatal Classes  no Hgb electrophoresis: carrier   Prenatal History/Complications:  Past Medical History: Past Medical History:  Diagnosis Date  . Medical history non-contributory   Sickle cell trait,  Alpha thalassemia trait GBS +  Past Surgical History: Past Surgical History:  Procedure Laterality Date  . NO PAST SURGERIES      Obstetrical History: OB History    Gravida Para Term Preterm AB Living   1             SAB TAB Ectopic Multiple Live Births                  Social History: Social History   Socioeconomic History  . Marital status: Single    Spouse name: None  . Number of children: None  . Years of education: None  . Highest education level: None  Social Needs  . Financial resource strain: None  . Food insecurity - worry: None  . Food  insecurity - inability: None  . Transportation needs - medical: None  . Transportation needs - non-medical: None  Occupational History  . None  Tobacco Use  . Smoking status: Never Smoker  . Smokeless tobacco: Never Used  Substance and Sexual Activity  . Alcohol use: No  . Drug use: No  . Sexual activity: Not Currently    Birth control/protection: None  Other Topics Concern  . None  Social History Narrative  . None    Family History: Family History  Problem Relation Age of Onset  . Hypertension Mother     Allergies: No Known Allergies  Medications Prior to Admission  Medication Sig Dispense Refill Last Dose  . acetaminophen (TYLENOL) 325 MG tablet Take 650 mg by mouth every 6 (  six) hours as needed for moderate pain.   Taking  . doxylamine, Sleep, (UNISOM) 25 MG tablet Take 1 tablet (25 mg total) by mouth at bedtime as needed. (Patient not taking: Reported on 06/22/2017) 30 tablet 0 Not Taking  . ondansetron (ZOFRAN ODT) 8 MG disintegrating tablet Take 1 tablet (8 mg total) by mouth every 8 (eight) hours as needed for nausea or vomiting. 30 tablet 2 Taking  . Prenatal Multivit-Min-Fe-FA (PRENATAL VITAMINS PO) Take 1 tablet by mouth daily.    Taking     Review of Systems   All systems reviewed and negative except as stated in HPI  Blood pressure (!) 146/92, pulse (!) 140, temperature 98 F (36.7 C), temperature source Oral, resp. rate 18, height 5\' 7"  (1.702 m), weight 190 lb 1.3 oz (86.2 kg), last menstrual period 10/06/2016, SpO2 100 %. General appearance: alert, cooperative and appears stated age Lungs: clear to auscultation bilaterally Heart: regular rate and rhythm Abdomen: soft, non-tender; bowel sounds normal Pelvic: 5/90 Extremities: Homans sign is negative, no sign of DVT DTR's intact  Presentation: cephalic Fetal monitoringBaseline: 145 bpm Uterine activityNone and irregular Dilation: 5 Effacement (%): 90 Exam by:: Zenia Resides, RN   Prenatal  labs: ABO, Rh: B/Positive/-- (09/11 1114) Antibody: Negative (09/11 1114) Rubella: 24.20 (09/11 1114) RPR: Non Reactive (01/10 1142)  HBsAg: Negative (09/11 1114)  HIV: Non Reactive (01/10 1142)  GBS:  pos  1 hr Glucola 3rd trimester normal  Genetic screening  Neg  Anatomy US normal  Prenatal Transfer Tool  Maternal Diabetes: No Genetic Screening: Normal Maternal Ultrasounds/Referrals: Normal Fetal Ultrasounds or other Referrals:  None Maternal Substance Abuse:  No Significant Maternal Medications:  None Significant Maternal Lab Results: None  No results found for this or any previous visit (from the past 24 hour(s)).  Patient Active Problem List   Diagnosis Date Noted  . Group B streptococcal infection during pregnancy 06/25/2017  . Sickle cell trait (HCC) 02/14/2017  . Alpha thalassemia trait 02/14/2017  . Supervision of low-risk first pregnancy 02/06/2017  . Nausea and vomiting in pregnancy prior to [redacted] weeks gestation 02/06/2017  . Needs flu shot 02/06/2017  . Screening, antenatal, for fetal anatomic survey 02/06/2017    Assessment/Plan:  Stacey [name] is a 32 y.o. G1P0 at [redacted]w[redacted]d here for SOL with elevated BP  #Labor:SOL, elevated BP in MAU HP labs pending #Pain: Per patient request  #FWB: Category I #ID:  GBS + amp #MOF: breast  #MOC:none  #Circ:  Yes outpatient   Nigel Bridgeman, MD  06/27/2017, 7:12 PM

## 2017-06-27 NOTE — MAU Note (Signed)
Pt present with ctx's.

## 2017-06-28 ENCOUNTER — Other Ambulatory Visit: Payer: Self-pay

## 2017-06-28 LAB — RPR: RPR Ser Ql: NONREACTIVE

## 2017-06-28 MED ORDER — POTASSIUM CHLORIDE 20 MEQ PO PACK
40.0000 meq | PACK | Freq: Once | ORAL | Status: DC
  Filled 2017-06-28: qty 2

## 2017-06-28 NOTE — Progress Notes (Signed)
POSTPARTUM PROGRESS NOTE  Post Partum Day 1  Subjective:  Stacey [name] is a 32 y.o. G1P1001 s/p SVD at 1016w0d.  No acute events overnight.  Pt denies problems with ambulating, voiding or po intake.  She denies nausea or vomiting.  Pain is well controlled.  She has had flatus. She has not had bowel movement.  Lochia Minimal.   Objective: Blood pressure 128/74, pulse 83, temperature 98.5 F (36.9 C), resp. rate 16, height 5\' 7"  (1.702 m), weight 86.2 kg (190 lb 1.3 oz), last menstrual period 10/06/2016, SpO2 100 %, unknown if currently breastfeeding.  Physical Exam:  General: alert, cooperative and no distress Chest: no respiratory distress Heart:regular rate, distal pulses intact Abdomen: soft, nontender,  Uterine Fundus: firm, appropriately tender DVT Evaluation: No calf swelling or tenderness Extremities: trace edema Skin: warm, dry  Recent Labs    06/27/17 1956  HGB 13.6  HCT 38.9    Assessment/Plan: Stacey OharaJudith Funnell is a 32 y.o. G1P1001 s/p SVD at 6516w0d. C/b sickle cell trait, alpha thalassemia trait  PPD#1 - Doing well, continue expectant management Contraception: undecided Feeding: breast Dispo: Plan for discharge tomorrow.   LOS: 1 day   Alroy BailiffParker W Marizol Borror MD 06/28/2017, 5:12 AM

## 2017-06-28 NOTE — Lactation Note (Signed)
This note was copied from a baby's chart. Lactation Consultation Note  Patient Name: Stacey [name]   Mom was asleep when entering the room; she requested for lactation to come back at a later time; she was very tired and wanted to rest. Asked mom if she was pumping before leaving the room and she said she's only pumped once today and didn't get any colostrum. Stressed the importance of pumping for an early term baby; instructed mom to call lactation if she needed assistance.      [name], 4:46 PM

## 2017-06-28 NOTE — Lactation Note (Signed)
This note was copied from a baby's chart. Lactation Consultation Note Baby 6 hrs old under warmer in CN.  Mom has great everted nipples and round breast. Mom states feeling heavier. Hand expression taught w/dot of thick colostrum. Mom excited to see colostrum.  Breast massage taught. Newborn feeding habits, behavior, STS, I&O, cluster feeding, supply and demand reviewed. LPI information sheet given. Discussed care for Early Term baby.  Mom encouraged to feed baby 8-12 times/24 hours and with feeding cues. Wake baby every 3 hrs if hasn't cued to feed.  Discussed w/mom the possible need to supplement after BF. Stimulation to breast important to induce lactation and collect any colostrum when available. Reviewed amount to give, not feeding longer than 30 min, prefer to give colostrum, BM verses formula. D/t weight 5.15 lbs, expects weight to drop, will monitor and assess when or if supplementing needed. Mom agreed. Mom shown how to use DEBP & how to disassemble, clean, & reassemble parts. Mom knows to pump q3h for 15-20 min. explained to mom normal not to collect colostrum at first, explained consistency of colostrum verses mature milk.  Encouraged comfort and support while BF. Call for assistance or questions. WH/LC brochure given w/resources, support groups and LC services.  Patient Name: Stacey [name] ZOXWR'UToday's Date: 06/28/2017 Reason for consult: Initial assessment;Early term 37-38.6wks;Infant < 6lbs   Maternal Data Has patient been taught Hand Expression?: Yes Does the patient have breastfeeding experience prior to this delivery?: No  Feeding    LATCH Score       Type of Nipple: Everted at rest and after stimulation  Comfort (Breast/Nipple): Soft / non-tender        Interventions Interventions: Breast feeding basics reviewed;Support pillows;Breast massage;Hand express;Position options;DEBP  Lactation Tools Discussed/Used Tools: Pump Breast pump type: Double-Electric  Breast Pump Pump Review: Setup, frequency, and cleaning;Milk Storage Initiated by:: Peri JeffersonL. Lynnette Pote RN IBCLC Date initiated:: 06/28/17   Consult Status Consult Status: Follow-up Date: 06/28/17 Follow-up type: In-patient    Stacey [name], Stacey [name] 06/28/2017, 3:24 AM

## 2017-06-29 ENCOUNTER — Encounter: Payer: Medicaid Other | Admitting: Obstetrics and Gynecology

## 2017-06-29 DIAGNOSIS — O133 Gestational [pregnancy-induced] hypertension without significant proteinuria, third trimester: Secondary | ICD-10-CM

## 2017-06-29 MED ORDER — IBUPROFEN 600 MG PO TABS: 600.0000 mg | ORAL_TABLET | Freq: Four times a day (QID) | ORAL | 0 refills | Status: DC

## 2017-06-29 NOTE — Discharge Summary (Signed)
OB Discharge Summary     Patient Name: Stacey [name] DOB: 30-Aug-1985 MRN: [id number]  Date of admission: 06/27/2017 Delivering MD: Alroy Bailiff   Date of discharge: 06/29/2017  Admitting diagnosis: 37.5wks ctx Intrauterine pregnancy: [redacted]w[redacted]d     Secondary diagnosis:  Active Problems:   Supervision of low-risk first pregnancy   Sickle cell trait (HCC)   Alpha thalassemia trait   Group B streptococcal infection during pregnancy   Indication for care in labor or delivery   Normal delivery   Gestational hypertension without significant proteinuria in third trimester      Discharge diagnosis: Term Pregnancy Delivered                                                                                                Post partum procedures:n/a  Augmentation: n/a  Complications: None  Hospital course:  Onset of Labor With Vaginal Delivery     32 y.o. yo G1P1001 at [redacted]w[redacted]d was admitted in Active Labor on 06/27/2017. Patient had an uncomplicated labor course as follows:  Membrane Rupture Time/Date: 8:43 PM ,06/27/2017   Intrapartum Procedures: Episiotomy: None [1]                                         Lacerations:  None [1]  Patient had a delivery of a Viable infant. 06/27/2017  Information for the patient's newborn:  Avalene, Sealy [[id number]]  Delivery Method: Vaginal, Spontaneous(Filed from Delivery Summary)    Pateint had an uncomplicated postpartum course.  She is ambulating, tolerating a regular diet, passing flatus, and urinating well. Patient is discharged home in stable condition on 06/29/17.   Physical exam  Vitals:   06/28/17 0337 06/28/17 0816 06/28/17 1823 06/29/17 0514  BP: 128/74 128/78 127/71 (!) 116/50  Pulse: 83 88 97 82  Resp:  16 16 18   Temp: 98.5 F (36.9 C) 99.4 F (37.4 C) 98.5 F (36.9 C) 98.8 F (37.1 C)  TempSrc:   Oral Oral  SpO2: 100% 99%    Weight:      Height:       General: alert, cooperative and no distress Lochia: appropriate Uterine  Fundus: firm Incision: N/A DVT Evaluation: No evidence of DVT seen on physical exam. Labs: Lab Results  Component Value Date   WBC 11.0 (H) 06/27/2017   HGB 13.6 06/27/2017   HCT 38.9 06/27/2017   MCV 78.7 06/27/2017   PLT 265 06/27/2017   CMP Latest Ref Rng & Units 06/27/2017  Glucose 65 - 99 mg/dL 782(N)  BUN 6 - 20 mg/dL 7  Creatinine 5.62 - 1.30 mg/dL 8.65  Sodium 784 - 696 mmol/L 131(L)  Potassium 3.5 - 5.1 mmol/L 3.4(L)  Chloride 101 - 111 mmol/L 102  CO2 22 - 32 mmol/L 17(L)  Calcium 8.9 - 10.3 mg/dL 9.1  Total Protein 6.5 - 8.1 g/dL 7.9  Total Bilirubin 0.3 - 1.2 mg/dL 0.6  Alkaline Phos 38 - 126 U/L 371(H)  AST 15 - 41 U/L 56(H)  ALT  14 - 54 U/L 34    Discharge instruction: per After Visit Summary and "Baby and Me Booklet".  After visit meds:  Allergies as of 06/29/2017   No Known Allergies     Medication List    STOP taking these medications   PRENATAL VITAMINS PO     TAKE these medications   acetaminophen 325 MG tablet Commonly known as:  TYLENOL Take 650 mg by mouth every 6 (six) hours as needed for moderate pain.   ibuprofen 600 MG tablet Commonly known as:  ADVIL,MOTRIN Take 1 tablet (600 mg total) by mouth every 6 (six) hours.       Diet: low salt diet  Activity: Advance as tolerated. Pelvic rest for 6 weeks.   Outpatient follow up: 4 weeks Follow up Appt:No future appointments. Follow up Visit:No Follow-up on file.  Postpartum contraception: Undecided  Newborn Data: Live born female  Birth Weight: 5 lb 15.2 oz (2699 g) APGAR: 8, 9  Newborn Delivery   Birth date/time:  06/27/2017 20:44:00 Delivery type:  Vaginal, Spontaneous     Baby Feeding: Breast Disposition:home with mother   06/29/2017 Alroy BailiffParker W Ardyth Kelso, MD

## 2017-06-30 ENCOUNTER — Ambulatory Visit: Payer: Self-pay

## 2017-06-30 NOTE — Lactation Note (Signed)
This note was copied from a baby's chart. Lactation Consultation Note  Patient Name: Stacey [name] JYNWG'NToday's Date: 06/30/2017 Reason for consult: Follow-up assessment;Infant < 6lbs   P1, Baby 66 hours old.  Reviewed hand expression with drops expressed and gave to baby on spoon. Baby latched with rhythmical sucks and swallows on both breasts for approx 25 min. Helped mother with manual and DEBP. Recommend mother post pump 4-6 times per day for 10-20 min with DEBP on initiation setting or with manual pump for 10 min per breast. Give baby back volume pumped at the next feeding. Reviewed cleaning and milk storage.  Demonstrated how to spoon feed baby. Mom encouraged to feed baby 8-12 times/24 hours and with feeding cues at least q 3 hours.  [facility] or directed to store for [facility]. [facility]. Recommend monitoring voids/stools.     Maternal Data Has patient been taught Hand Expression?: Yes  Feeding Feeding Type: Breast Fed  LATCH Score Latch: Grasps breast easily, tongue down, lips flanged, rhythmical sucking.  Audible Swallowing: A few with stimulation  Type of Nipple: Everted at rest and after stimulation  Comfort (Breast/Nipple): Filling, red/small blisters or bruises, mild/mod discomfort  Hold (Positioning): Assistance needed to correctly position infant at breast and maintain latch.  LATCH Score: 7  Interventions Interventions: Breast feeding basics reviewed;Assisted with latch;Skin to skin;Hand express;Adjust position;Support pillows;Expressed milk;Hand pump;DEBP  Lactation Tools Discussed/Used     Consult Status Consult Status: Follow-up Date: 07/01/17 Follow-up type: In-patient    Stacey [name], Stacey [name] 06/30/2017, 3:20 PM

## 2017-07-09 ENCOUNTER — Ambulatory Visit: Payer: Medicaid Other

## 2017-07-09 VITALS — BP 133/84 | HR 96

## 2017-07-09 DIAGNOSIS — Z0130 Encounter for examination of blood pressure without abnormal findings: Secondary | ICD-10-CM

## 2017-07-09 NOTE — Progress Notes (Signed)
Subjective:  Stacey [name] is a 32 y.o. female with hypertension. Current Outpatient Medications  Medication Sig Dispense Refill  . acetaminophen (TYLENOL) 325 MG tablet Take 650 mg by mouth every 6 (six) hours as needed for moderate pain.    Marland Kitchen. ibuprofen (ADVIL,MOTRIN) 600 MG tablet Take 1 tablet (600 mg total) by mouth every 6 (six) hours. 30 tablet 0   No current facility-administered medications for this visit.     Hypertension ROS: taking medications as instructed, no medication side effects noted, no TIA's, no chest pain on exertion, no dyspnea on exertion and no swelling of ankles.  New concerns: headaches.   Objective:  BP 133/84 (BP Location: Right Arm, Patient Position: Sitting, Cuff Size: Normal)   Pulse 96   LMP 10/06/2016 (Approximate)   Appearance alert, well appearing, and in no distress. General exam BP noted to be well controlled today in office.    Assessment:   Hypertension stable.   Plan:  Current treatment plan is effective, no change in therapy..Marland Kitchen

## 2017-07-12 ENCOUNTER — Encounter: Payer: Self-pay | Admitting: Obstetrics and Gynecology

## 2017-07-13 ENCOUNTER — Telehealth: Payer: Self-pay

## 2017-07-13 NOTE — Telephone Encounter (Signed)
error 

## 2017-08-01 ENCOUNTER — Ambulatory Visit (INDEPENDENT_AMBULATORY_CARE_PROVIDER_SITE_OTHER): Payer: Medicaid Other | Admitting: Obstetrics and Gynecology

## 2017-08-01 ENCOUNTER — Encounter: Payer: Self-pay | Admitting: Obstetrics and Gynecology

## 2017-08-01 DIAGNOSIS — L732 Hidradenitis suppurativa: Secondary | ICD-10-CM

## 2017-08-01 DIAGNOSIS — Z1389 Encounter for screening for other disorder: Secondary | ICD-10-CM | POA: Diagnosis not present

## 2017-08-01 MED ORDER — AMOXICILLIN-POT CLAVULANATE 875-125 MG PO TABS: 1.0000 | ORAL_TABLET | Freq: Two times a day (BID) | ORAL | 1 refills | Status: DC

## 2017-08-01 NOTE — Patient Instructions (Signed)

## 2017-08-01 NOTE — Progress Notes (Signed)
Post Partum Exam  Stacey OharaJudith [name] is a 32 y.o. 611P1001 female who presents for a postpartum visit. She is 5 weeks postpartum following a spontaneous vaginal delivery. I have fully reviewed the prenatal and intrapartum course. The delivery was at 37 gestational weeks.  Anesthesia: none. Postpartum course has been uncomplicated. Baby's course has been uncomplicated. Baby is feeding by breast. Bleeding no bleeding. Bowel function is normal. Bladder function is normal. Patient is not sexually active. Contraception method is none. Postpartum depression screening:neg  The following portions of the patient's history were reviewed and updated as appropriate: allergies, current medications, past family history, past medical history, past social history and past surgical history. Last pap smear done 01/2017 and was Normal  Review of Systems Pertinent items are noted in HPI.    Objective:  Last menstrual period 10/06/2016, unknown if currently breastfeeding.  General:  alert   Breasts:  not evaluated  Lungs: clear to auscultation bilaterally  Heart:  regular rate and rhythm, S1, S2 normal, no murmur, click, rub or gallop  Abdomen: soft, non-tender; bowel sounds normal; no masses,  no organomegaly   Vulva:  area of hidradenitis left groin   Vagina: not evaluated  Cervix:  not evaluated  Corpus: not examined  Adnexa:  not evaluated  Rectal Exam: Not performed.        Assessment:    NL postpartum exam.  Hidradenitis  Plan:   1. Contraception: condoms 2. Return to normal ADL's as tolerated  3. Augmentin and warm compresses for hidradenitis 3. Follow up in: 1 yr or as needed.

## 2018-03-11 IMAGING — US US ABDOMEN LIMITED
1 series · 14 of 25 positions shown · non-contrast
Comparison: None.

CLINICAL DATA: RIGHT upper quadrant pain for 2 hours. 24 weeks
pregnant.

EXAM:
ULTRASOUND ABDOMEN LIMITED RIGHT UPPER QUADRANT

[Series 1: us abdomen limited · 0.28mm/px · 14 of 52 slices shown]
[im 1/52]
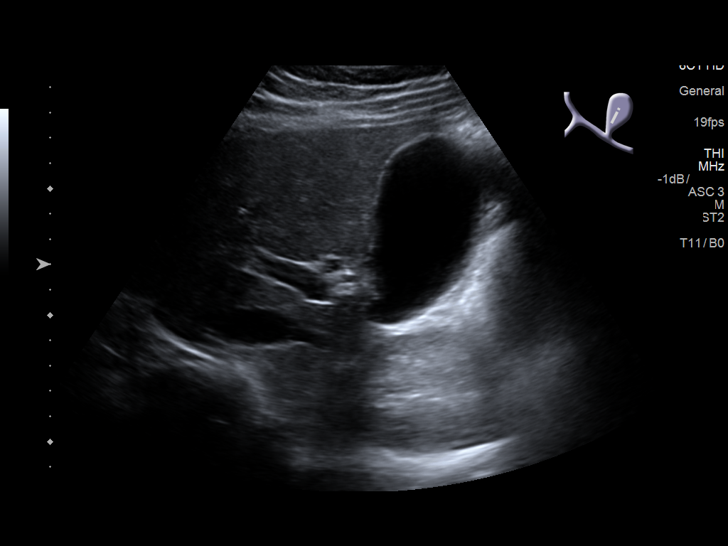
[im 5/52]
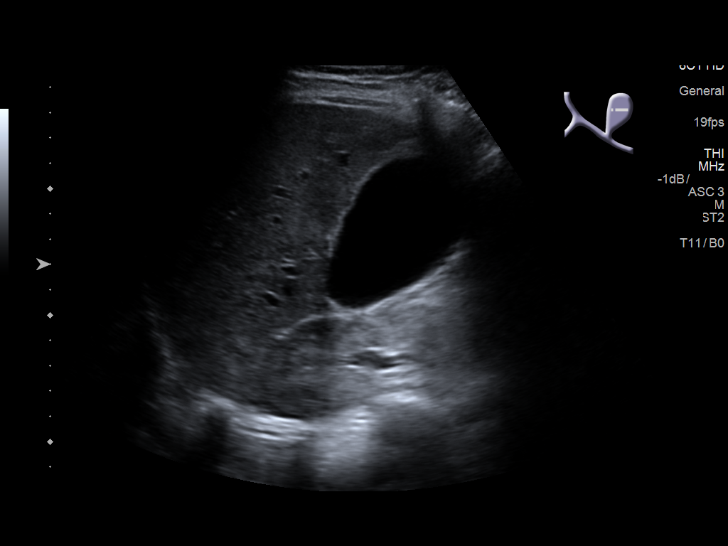
[im 9/52]
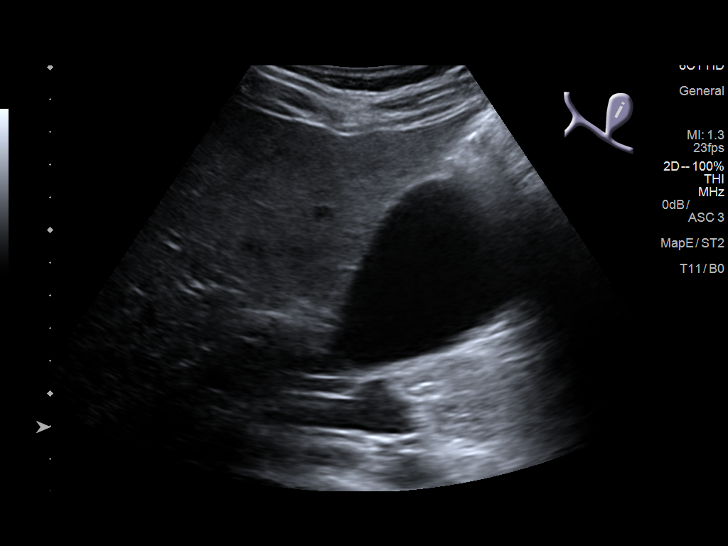
[im 13/52]
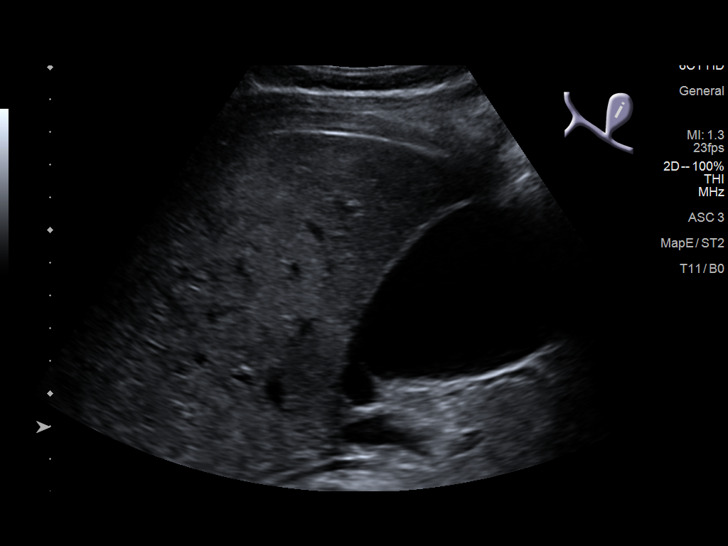
[im 18/52]
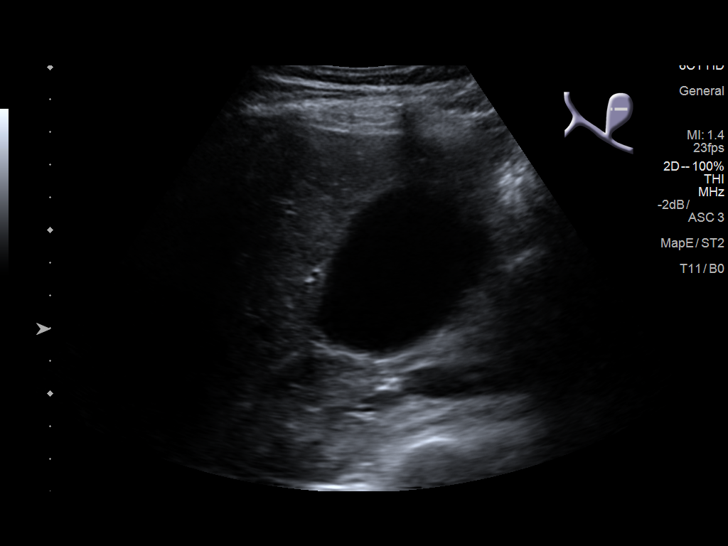
[im 20/52]
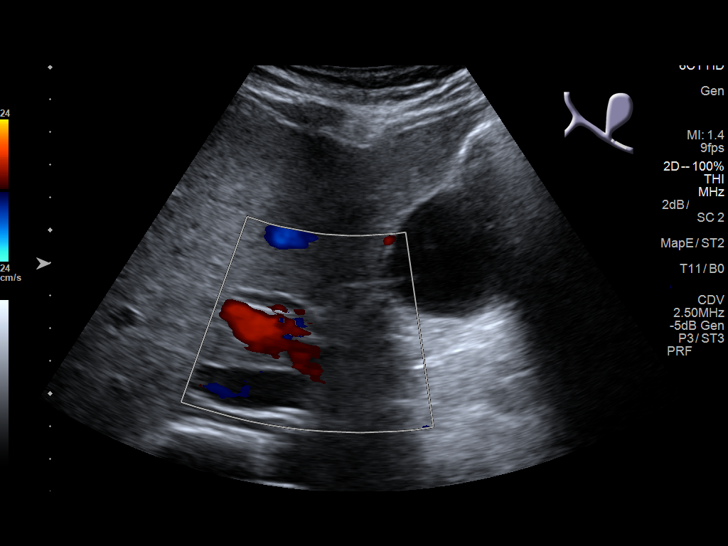
[im 24/52]
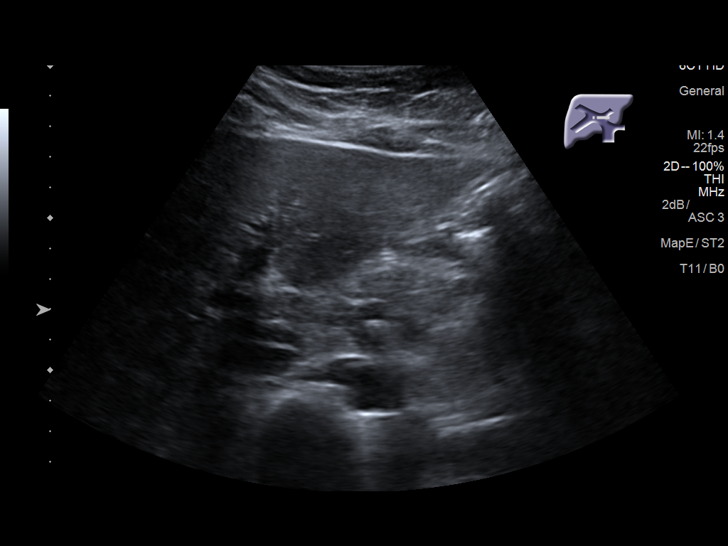
[im 28/52]
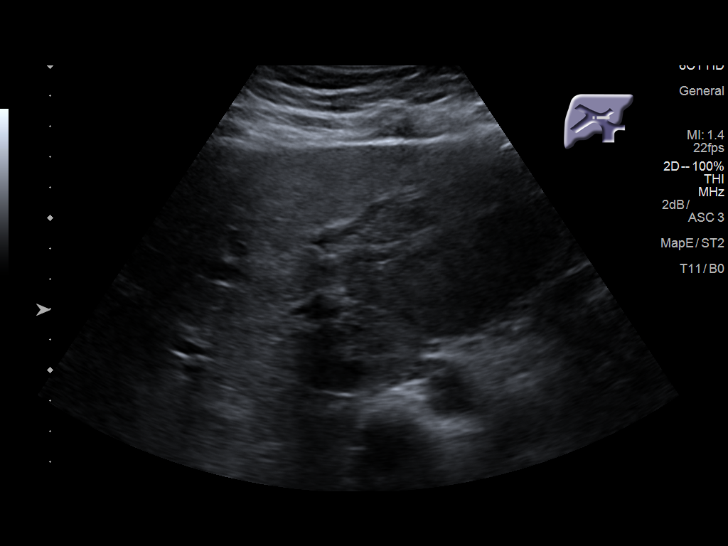
[im 32/52]
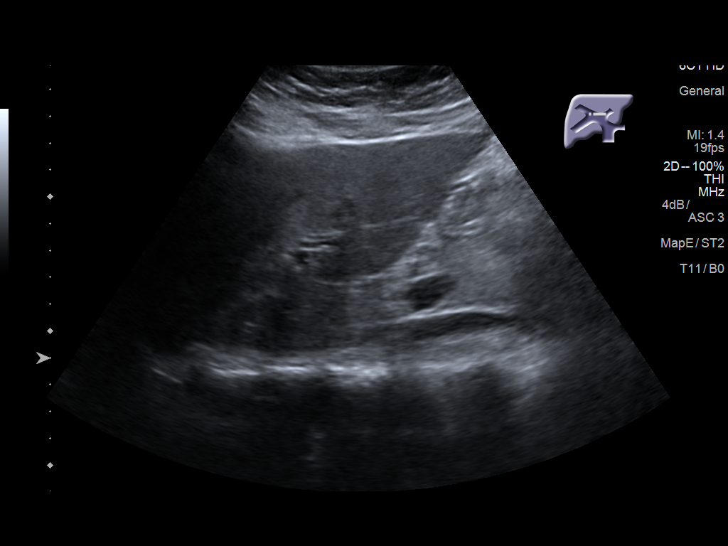
[im 35/52]
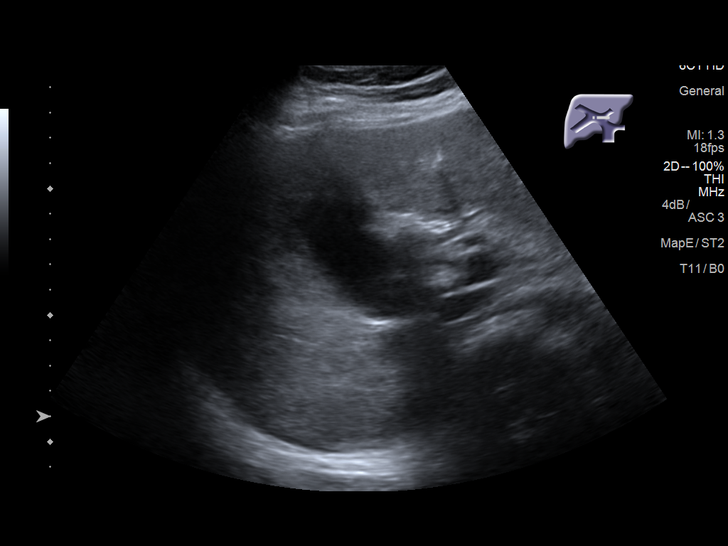
[im 39/52]
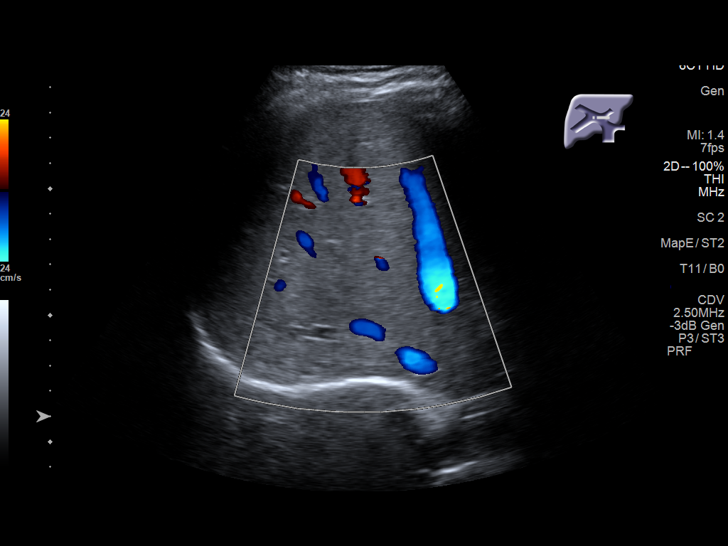
[im 43/52]
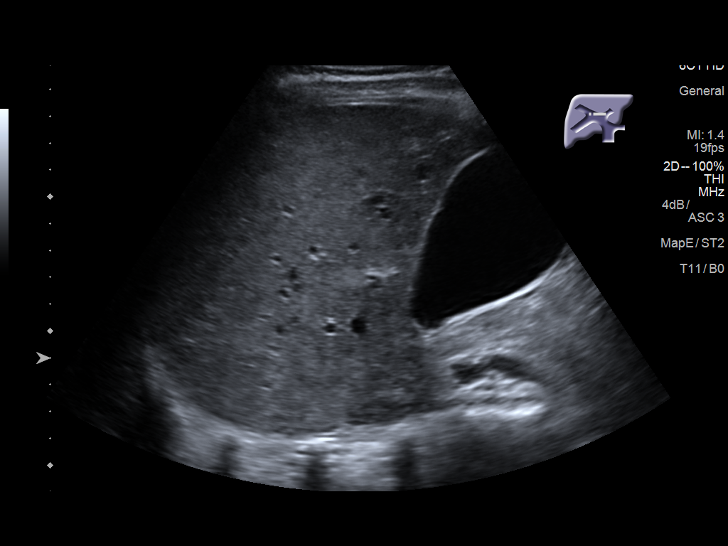
[im 47/52]
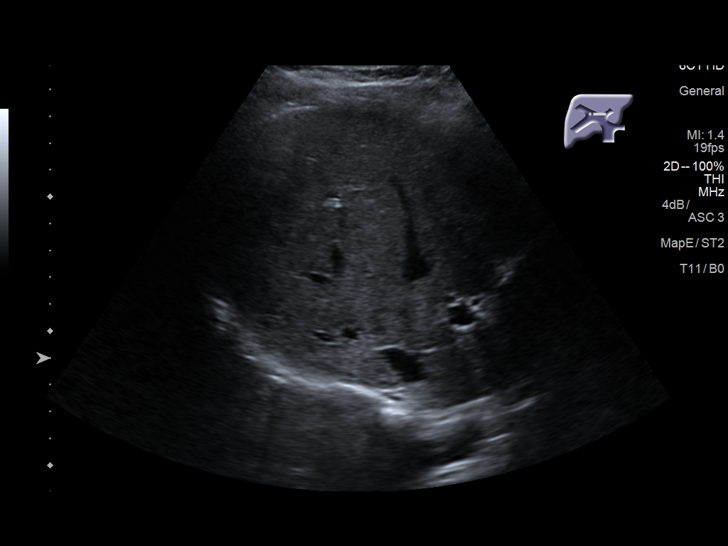
[im 52/52]
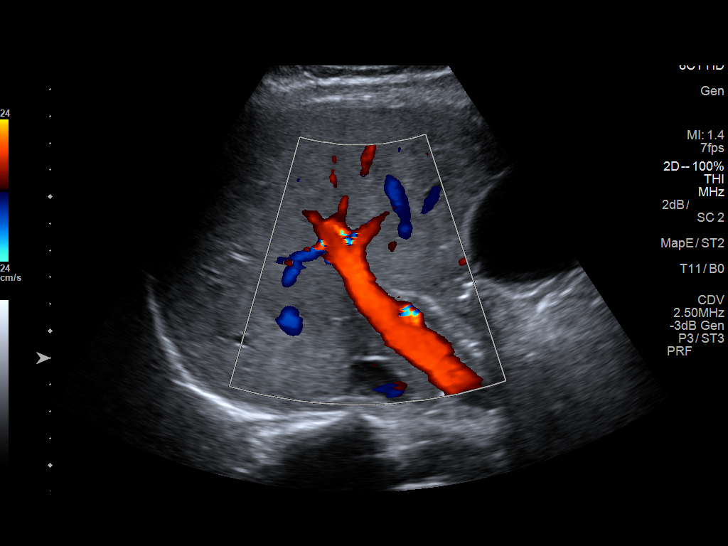

[14 of 25 positions shown; findings below may reference images not displayed]

FINDINGS: Gallbladder:

No gallstones or wall thickening visualized. No sonographic Murphy
sign noted by sonographer.

Common bile duct:

Diameter: 4 mm

Liver:

No focal lesion identified. Within normal limits in parenchymal
echogenicity. Portal vein is patent on color Doppler imaging with
normal direction of blood flow towards the liver.
IMPRESSION: Normal RIGHT upper quadrant ultrasound.

## 2018-05-02 ENCOUNTER — Other Ambulatory Visit: Payer: Self-pay

## 2018-05-02 ENCOUNTER — Ambulatory Visit (HOSPITAL_COMMUNITY)
Admission: EM | Admit: 2018-05-02 | Discharge: 2018-05-02 | Disposition: A | Discharge status: Home / Self Care | Payer: Medicaid Other | Attending: Nurse Practitioner | Admitting: Nurse Practitioner

## 2018-05-02 ENCOUNTER — Encounter (HOSPITAL_COMMUNITY): Payer: Self-pay

## 2018-05-02 DIAGNOSIS — J069 Acute upper respiratory infection, unspecified: Secondary | ICD-10-CM | POA: Diagnosis not present

## 2018-05-02 DIAGNOSIS — B9789 Other viral agents as the cause of diseases classified elsewhere: Secondary | ICD-10-CM

## 2018-05-02 MED ORDER — GUAIFENESIN 100 MG/5ML PO LIQD: 100.0000 mg | ORAL | 0 refills | Status: DC | PRN

## 2018-05-02 NOTE — Discharge Instructions (Addendum)
Take described medicine as needed for cough.  Continue Claritin daily.  Take Tylenol or ibuprofen as needed for headaches.  Drink lots of fluids.  Follow-up as needed.

## 2018-05-02 NOTE — ED Triage Notes (Signed)
Pt cc cough and headaches x 3 days

## 2018-05-02 NOTE — ED Provider Notes (Signed)
[facility]    CSN: [id number] Arrival date & time: 05/02/18  1252     History   Chief Complaint Chief Complaint  Patient presents with  . Cough    HPI Stacey [name] is a 32 y.o. female.   Subjective:   History was provided by the patient.  Stacey [name] is a 32 y.o. female who presents for evaluation of symptoms of a URI. Symptoms include productive cough, runny nose and headache. Onset of symptoms was 3 days ago, unchanged since that time. She denies any congestion, sore throat, ear pain, shortness of breath or fevers.  She is drinking plenty of fluids. Evaluation to date: none. Treatment to date: Claritin without any relief in symptoms.  The following portions of the patient's history were reviewed and updated as appropriate: allergies, current medications, past family history, past medical history, past social history, past surgical history and problem list.        Past Medical History:  Diagnosis Date  . Medical history non-contributory     Patient Active Problem List   Diagnosis Date Noted  . Postpartum care and examination 08/01/2017  . Hidradenitis 08/01/2017  . Sickle cell trait (HCC) 02/14/2017  . Alpha thalassemia trait 02/14/2017    Past Surgical History:  Procedure Laterality Date  . NO PAST SURGERIES      OB History    Gravida  1   Para  1   Term  1   Preterm      AB      Living  1     SAB      TAB      Ectopic      Multiple  0   Live Births  1            Home Medications    Prior to Admission medications   Medication Sig Start Date End Date Taking? Authorizing Provider  acetaminophen (TYLENOL) 325 MG tablet Take 650 mg by mouth every 6 (six) hours as needed for moderate pain.    [provider]  guaiFENesin (ROBITUSSIN) 100 MG/5ML liquid Take 5-10 mLs (100-200 mg total) by mouth every 4 (four) hours as needed for cough. 05/02/18   Lurline IdolMurrill, Tearah Saulsbury, FNP  ibuprofen (ADVIL,MOTRIN) 600 MG tablet Take  1 tablet (600 mg total) by mouth every 6 (six) hours. 06/29/17   Alroy BailiffLeland, Parker W, MD    Family History Family History  Problem Relation Age of Onset  . Hypertension Mother     Social History Social History   Tobacco Use  . Smoking status: Never Smoker  . Smokeless tobacco: Never Used  Substance Use Topics  . Alcohol use: No  . Drug use: No     Allergies   Patient has no known allergies.   Review of Systems Review of Systems  Constitutional: Negative for fever.  HENT: Positive for rhinorrhea.   Respiratory: Positive for cough. Negative for shortness of breath and wheezing.   Cardiovascular: Negative.   Neurological: Positive for headaches.  All other systems reviewed and are negative.    Physical Exam Triage Vital Signs ED Triage Vitals  Enc Vitals Group     BP 05/02/18 1412 (!) 141/76     Pulse Rate 05/02/18 1412 92     Resp 05/02/18 1412 16     Temp 05/02/18 1412 98.9 F (37.2 C)     Temp Source 05/02/18 1412 Oral     SpO2 05/02/18 1412 100 %     Weight 05/02/18  1413 182 lb (82.6 kg)     Height --      Head Circumference --      Peak Flow --      Pain Score 05/02/18 1413 4     Pain Loc --      Pain Edu? --      Excl. in GC? --    No data found.  Updated Vital Signs BP (!) 141/76 (BP Location: Right Arm)   Pulse 92   Temp 98.9 F (37.2 C) (Oral)   Resp 16   Wt 182 lb (82.6 kg)   LMP 04/14/2018   SpO2 100%   BMI 28.51 kg/m   Visual Acuity Right Eye Distance:   Left Eye Distance:   Bilateral Distance:    Right Eye Near:   Left Eye Near:    Bilateral Near:     Physical Exam  Constitutional: She is oriented to person, place, and time. She appears well-developed and well-nourished.  HENT:  Head: Normocephalic.  Right Ear: External ear normal.  Left Ear: External ear normal.  Nose: Nose normal.  Mouth/Throat: Oropharynx is clear and moist.  Eyes: Pupils are equal, round, and reactive to light. Conjunctivae and EOM are normal.  Neck:  Normal range of motion. Neck supple.  Cardiovascular: Normal rate, regular rhythm and normal heart sounds.  Pulmonary/Chest: Effort normal and breath sounds normal.  Musculoskeletal: Normal range of motion.  Lymphadenopathy:    She has no cervical adenopathy.  Neurological: She is alert and oriented to person, place, and time.  Skin: Skin is warm and dry.  Psychiatric: She has a normal mood and affect.     UC Treatments / Results  Labs (all labs ordered are listed, but only abnormal results are displayed) Labs Reviewed - No data to display  EKG None  Radiology No results found.  Procedures Procedures (including critical care time)  Medications Ordered in UC Medications - No data to display  Initial Impression / Assessment and Plan / UC Course  I have reviewed the triage vital signs and the nursing notes.  Pertinent labs & imaging results that were available during my care of the patient were reviewed by me and considered in my medical decision making (see chart for details).      32 year old female presenting with a 3-day history of productive cough, runny nose and headache.  Patient is afebrile.  Vital signs stable.  Nontoxic-appearing.  Physical exam unremarkable.  Supportive care advised.  Follow-up as needed.     Plan:  Discussed diagnosis and treatment of URI. Discussed the importance of avoiding unnecessary antibiotic therapy. Suggested symptomatic OTC remedies. Follow up as needed.Today's evaluation has revealed no signs of a dangerous process. Discussed diagnosis with patient. Patient aware of their diagnosis, possible red flag symptoms to watch out for and need for close follow up. Patient understands verbal and written discharge instructions. Patient comfortable with plan and disposition.  Patient has a clear mental status at this time, good insight into illness (after discussion and teaching) and has clear judgment to make decisions regarding their  care.  Documentation was completed with the aid of voice recognition software. Transcription may contain typographical errors. Final Clinical Impressions(s) / UC Diagnoses   Final diagnoses:  Viral URI with cough     Discharge Instructions     Take described medicine as needed for cough.  Continue Claritin daily.  Take Tylenol or ibuprofen as needed for headaches.  Drink lots of fluids.  Follow-up as needed.  ED Prescriptions    Medication Sig Dispense Auth. Provider   guaiFENesin (ROBITUSSIN) 100 MG/5ML liquid Take 5-10 mLs (100-200 mg total) by mouth every 4 (four) hours as needed for cough. 60 mL Lurline Idol, FNP     Controlled Substance Prescriptions [place] Controlled Substance Registry consulted? Not Applicable   Lurline Idol, FNP 05/02/18 1515

## 2018-07-24 ENCOUNTER — Ambulatory Visit (INDEPENDENT_AMBULATORY_CARE_PROVIDER_SITE_OTHER): Payer: Medicaid Other | Admitting: Emergency Medicine

## 2018-07-24 ENCOUNTER — Encounter: Payer: Self-pay | Admitting: Obstetrics & Gynecology

## 2018-07-24 DIAGNOSIS — Z3200 Encounter for pregnancy test, result unknown: Secondary | ICD-10-CM

## 2018-07-24 DIAGNOSIS — Z3201 Encounter for pregnancy test, result positive: Secondary | ICD-10-CM

## 2018-07-24 LAB — POCT PREGNANCY, URINE: PREG TEST UR: POSITIVE — AB

## 2018-07-24 NOTE — Progress Notes (Signed)
Pt came in today for a UPT after having positive home pregnancy test on 2/11. UPT positive today. Pt reports LMP 1/13 which makes the GA [redacted]w[redacted]d and EDD 03/17/19. Pt reports currently taking prenatal vitamins. Pt complains of n/v and was given the list of medications safe to take during pregnancy. Nausea medications were reviewed including unisom and vitamin B. Pt instructed to take as directed and to start prenatal care soon. Pt verbalized understanding and had no further questions, comments, or concerns.   Toni Amend RN

## 2018-07-25 LAB — POCT PREGNANCY, URINE: PREG TEST UR: POSITIVE — AB

## 2018-07-26 NOTE — Progress Notes (Signed)
I have reviewed the chart and agree with nursing staff's documentation of this patient's encounter.  Vonzella Nipple, PA-C 07/26/2018 8:36 AM

## 2018-08-15 ENCOUNTER — Inpatient Hospital Stay (HOSPITAL_COMMUNITY)
Admission: AD | Admit: 2018-08-15 | Discharge: 2018-08-15 | Disposition: A | Discharge status: Home / Self Care | Payer: Medicaid Other | Attending: Obstetrics and Gynecology | Admitting: Obstetrics and Gynecology

## 2018-08-15 ENCOUNTER — Encounter (HOSPITAL_COMMUNITY): Payer: Self-pay | Admitting: Student

## 2018-08-15 ENCOUNTER — Ambulatory Visit (HOSPITAL_COMMUNITY)
Admission: EM | Admit: 2018-08-15 | Discharge: 2018-08-15 | Disposition: A | Discharge status: Home / Self Care | Payer: Medicaid Other

## 2018-08-15 ENCOUNTER — Inpatient Hospital Stay (HOSPITAL_COMMUNITY): Payer: Medicaid Other

## 2018-08-15 ENCOUNTER — Ambulatory Visit: Payer: Medicaid Other | Admitting: *Deleted

## 2018-08-15 ENCOUNTER — Other Ambulatory Visit: Payer: Self-pay

## 2018-08-15 VITALS — Temp 98.7°F

## 2018-08-15 DIAGNOSIS — O209 Hemorrhage in early pregnancy, unspecified: Secondary | ICD-10-CM

## 2018-08-15 DIAGNOSIS — Z3A09 9 weeks gestation of pregnancy: Secondary | ICD-10-CM | POA: Insufficient documentation

## 2018-08-15 DIAGNOSIS — O4690 Antepartum hemorrhage, unspecified, unspecified trimester: Secondary | ICD-10-CM

## 2018-08-15 DIAGNOSIS — O26891 Other specified pregnancy related conditions, first trimester: Secondary | ICD-10-CM | POA: Diagnosis not present

## 2018-08-15 DIAGNOSIS — Z791 Long term (current) use of non-steroidal anti-inflammatories (NSAID): Secondary | ICD-10-CM | POA: Insufficient documentation

## 2018-08-15 DIAGNOSIS — O219 Vomiting of pregnancy, unspecified: Secondary | ICD-10-CM | POA: Diagnosis not present

## 2018-08-15 DIAGNOSIS — O020 Blighted ovum and nonhydatidiform mole: Secondary | ICD-10-CM | POA: Diagnosis not present

## 2018-08-15 DIAGNOSIS — R109 Unspecified abdominal pain: Secondary | ICD-10-CM

## 2018-08-15 LAB — URINALYSIS, ROUTINE W REFLEX MICROSCOPIC
Bilirubin Urine: NEGATIVE
Glucose, UA: NEGATIVE mg/dL
Ketones, ur: NEGATIVE mg/dL
Leukocytes,Ua: NEGATIVE
NITRITE: NEGATIVE
PH: 6 (ref 5.0–8.0)
Protein, ur: NEGATIVE mg/dL
SPECIFIC GRAVITY, URINE: 1.018 (ref 1.005–1.030)

## 2018-08-15 LAB — CBC
HEMATOCRIT: 38.2 % (ref 36.0–46.0)
HEMOGLOBIN: 12.8 g/dL (ref 12.0–15.0)
MCH: 26.3 pg (ref 26.0–34.0)
MCHC: 33.5 g/dL (ref 30.0–36.0)
MCV: 78.4 fL — AB (ref 80.0–100.0)
Platelets: 309 10*3/uL (ref 150–400)
RBC: 4.87 MIL/uL (ref 3.87–5.11)
RDW: 14.2 % (ref 11.5–15.5)
WBC: 7.5 10*3/uL (ref 4.0–10.5)
nRBC: 0 % (ref 0.0–0.2)

## 2018-08-15 LAB — WET PREP, GENITAL
Clue Cells Wet Prep HPF POC: NONE SEEN
Sperm: NONE SEEN
TRICH WET PREP: NONE SEEN
YEAST WET PREP: NONE SEEN

## 2018-08-15 LAB — HCG, QUANTITATIVE, PREGNANCY: hCG, Beta Chain, Quant, S: 52485 m[IU]/mL — ABNORMAL HIGH (ref <5)

## 2018-08-15 NOTE — MAU Note (Addendum)
Pt reports she is having some dark brown discharge since last Wed. Reports some sharp  Right abd pain that comes and goes. Pt also c/o N/V taking promethazine and that helps but makes her sleepy

## 2018-08-15 NOTE — MAU Provider Note (Signed)
Chief Complaint: Vaginal Discharge   First Provider Initiated Contact with Patient 08/15/18 1540     SUBJECTIVE HPI: Stacey [name] is a 33 y.o. G2P1001 at [redacted]w[redacted]d who presents to [facility] reporting abdominal pain & vaginal bleeding. Symptoms started 1 week ago. Reports brown spotting in a panty liner. Not saturating pads or passing clots.  Abdominal pain in RLQ that is intermittent and occurs when she has an episode of bleeding, especially after a lot of movement. Denies fever/chills, diarrhea, constipation, dysuria, or recent intercourse. Has been having n/v that she's treated successfully with phenergan. Last BM was yesterday.   Location: lower abdomen, RLQ Quality: sharp Severity: 2/10 on pain scale Duration: 1 week Timing: intermittent Modifying factors: worse with movement Associated signs and symptoms: vaginal bleeding  Past Medical History:  Diagnosis Date  . Medical history non-contributory    OB History  Gravida Para Term Preterm AB Living  2 1 1     1   SAB TAB Ectopic Multiple Live Births        0 1    # Outcome Date GA Lbr Len/2nd Weight Sex Delivery Anes PTL Lv  2 Current           1 Term 06/27/17 [redacted]w[redacted]d / 00:10 2699 g M Vag-Spont None  LIV   Past Surgical History:  Procedure Laterality Date  . NO PAST SURGERIES     Social History   Socioeconomic History  . Marital status: Single    Spouse name: Not on file  . Number of children: Not on file  . Years of education: Not on file  . Highest education level: Not on file  Occupational History  . Not on file  Social Needs  . Financial resource strain: Not on file  . Food insecurity:    Worry: Not on file    Inability: Not on file  . Transportation needs:    Medical: Not on file    Non-medical: Not on file  Tobacco Use  . Smoking status: Never Smoker  . Smokeless tobacco: Never Used  Substance and Sexual Activity  . Alcohol use: No  . Drug use: No  . Sexual activity: Yes    Partners: Male   Birth control/protection: None  Lifestyle  . Physical activity:    Days per week: Not on file    Minutes per session: Not on file  . Stress: Not on file  Relationships  . Social connections:    Talks on phone: Not on file    Gets together: Not on file    Attends religious service: Not on file    Active member of club or organization: Not on file    Attends meetings of clubs or organizations: Not on file    Relationship status: Not on file  . Intimate partner violence:    Fear of current or ex partner: Not on file    Emotionally abused: Not on file    Physically abused: Not on file    Forced sexual activity: Not on file  Other Topics Concern  . Not on file  Social History Narrative  . Not on file   Family History  Problem Relation Age of Onset  . Hypertension Mother    No current facility-administered medications on file prior to encounter.    Current Outpatient Medications on File Prior to Encounter  Medication Sig Dispense Refill  . acetaminophen (TYLENOL) 325 MG tablet Take 650 mg by mouth every 6 (six) hours as needed for moderate pain.    Marland Kitchen  guaiFENesin (ROBITUSSIN) 100 MG/5ML liquid Take 5-10 mLs (100-200 mg total) by mouth every 4 (four) hours as needed for cough. 60 mL 0  . ibuprofen (ADVIL,MOTRIN) 600 MG tablet Take 1 tablet (600 mg total) by mouth every 6 (six) hours. 30 tablet 0   No Known Allergies  I have reviewed patient's Past Medical Hx, Surgical Hx, Family Hx, Social Hx, medications and allergies.   Review of Systems  Constitutional: Negative.   Gastrointestinal: Positive for abdominal pain, nausea and vomiting. Negative for constipation and diarrhea.  Genitourinary: Positive for vaginal bleeding. Negative for vaginal discharge.    OBJECTIVE Patient Vitals for the past 24 hrs:  BP Temp Pulse Resp Height Weight  08/15/18 1724 - - - 16 - -  08/15/18 1517 (!) 118/58 98.2 F (36.8 C) 85 18 5\' 6"  (1.676 m) 83.5 kg   Constitutional: Well-developed,  well-nourished female in no acute distress.  Cardiovascular: normal rate & rhythm, no murmur Respiratory: normal rate and effort. Lung sounds clear throughout GI: Abd soft, non-tender, Pos BS x 4. No guarding or rebound tenderness MS: Extremities nontender, no edema, normal ROM Neurologic: Alert and oriented x 4.  GU:     SPECULUM EXAM: NEFG, small amount of old brown blood, no active bleeding, Cervix pink/smooth  BIMANUAL: No CMT. cervix closed; uterus normal size, no adnexal tenderness or masses.    LAB RESULTS Results for orders placed or performed during the hospital encounter of 08/15/18 (from the past 24 hour(s))  Wet prep, genital     Status: Abnormal   Collection Time: 08/15/18  3:59 PM  Result Value Ref Range   Yeast Wet Prep HPF POC NONE SEEN NONE SEEN   Trich, Wet Prep NONE SEEN NONE SEEN   Clue Cells Wet Prep HPF POC NONE SEEN NONE SEEN   WBC, Wet Prep HPF POC MODERATE (A) NONE SEEN   Sperm NONE SEEN   CBC     Status: Abnormal   Collection Time: 08/15/18  4:11 PM  Result Value Ref Range   WBC 7.5 4.0 - 10.5 K/uL   RBC 4.87 3.87 - 5.11 MIL/uL   Hemoglobin 12.8 12.0 - 15.0 g/dL   HCT 11.0 31.5 - 94.5 %   MCV 78.4 (L) 80.0 - 100.0 fL   MCH 26.3 26.0 - 34.0 pg   MCHC 33.5 30.0 - 36.0 g/dL   RDW 85.9 29.2 - 44.6 %   Platelets 309 150 - 400 K/uL   nRBC 0.0 0.0 - 0.2 %  hCG, quantitative, pregnancy     Status: Abnormal   Collection Time: 08/15/18  4:11 PM  Result Value Ref Range   hCG, Beta Chain, Quant, S 52,485 (H) <5 mIU/mL    IMAGING US Ob Less Than 14 Weeks With Ob Transvaginal  Result Date: 08/15/2018 CLINICAL DATA:  Vaginal bleeding and abdominal pain in first trimester of pregnancy EXAM: OBSTETRIC <14 WK [place] AND TRANSVAGINAL OB US TECHNIQUE: Both transabdominal and transvaginal ultrasound examinations were performed for complete evaluation of the gestation as well as the maternal uterus, adnexal regions, and pelvic cul-de-sac. Transvaginal technique was performed  to assess early pregnancy. COMPARISON:  None for this gestation FINDINGS: Intrauterine gestational sac: Present, single Yolk sac:  Present Embryo:  Not visualized Cardiac Activity: N/A Heart Rate: N/A  bpm MSD: 26.5 mm   7 w   4 d Subchorionic hemorrhage:  None visualized. 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 RIGHT ovary normal size and morphology  3.9 x 3.4 x 3.5 cm, containing small corpus luteum. LEFT ovary normal size and morphology 3.2 x 2.1 x 3.9 cm. No free pelvic fluid or adnexal masses. IMPRESSION: Intrauterine gestational sac containing a yolk sac but no fetal pole is visualized. Based on mean sac diameter of 25 mm or greater, findings meet definitive criteria for failed pregnancy. This follows SRU consensus guidelines: Diagnostic Criteria for Nonviable Pregnancy Early in the First Trimester. [facility] Mis J Med [phone]. Electronically Signed   By: Ulyses Southward M.D.   On: 08/15/2018 16:54    MAU COURSE Orders Placed This Encounter  Procedures  . Wet prep, genital  . US OB LESS THAN 14 WEEKS WITH OB TRANSVAGINAL  . Urinalysis, Routine w reflex microscopic  . CBC  . hCG, quantitative, pregnancy  . HIV Antibody (routine testing w rflx)  . Discharge patient   No orders of the defined types were placed in this encounter.   MDM +UPT UA, wet prep, GC/chlamydia, CBC, ABO/Rh, quant hCG, and [place] today to rule out ectopic pregnancy RH positive  Ultrasound IUGS measuring 26 mm with YS, No embryo. Meets definitive criteria for failed pregnancy.   Discussed options for management of incomplete AB including expectant management, Cytotec or D&C. Prefers expectant management at this time. Verbalizes understanding that intervention may become necessary if SAB in not completed spontaneously or if heavy bleeding or infection occur.   ASSESSMENT 1. Anembryonic pregnancy   2. Vaginal bleeding in pregnancy, first trimester   3. Abdominal pain during  pregnancy in first trimester     PLAN Discharge home in stable condition. Bleeding/infection precautions Msg sent to clinic for SAB f/u appointment  Allergies as of 08/15/2018   No Known Allergies     Medication List    TAKE these medications   acetaminophen 325 MG tablet Commonly known as:  TYLENOL Take 650 mg by mouth every 6 (six) hours as needed for moderate pain.   guaiFENesin 100 MG/5ML liquid Commonly known as:  ROBITUSSIN Take 5-10 mLs (100-200 mg total) by mouth every 4 (four) hours as needed for cough.   ibuprofen 600 MG tablet Commonly known as:  ADVIL,MOTRIN Take 1 tablet (600 mg total) by mouth every 6 (six) hours.        Judeth Horn, NP 08/15/2018  5:26 PM

## 2018-08-15 NOTE — ED Notes (Signed)
Pt reports vaginal bleeding x1 week.  Pt has a positive pregnancy test in Epic. Dr. Leonides Grills aware.  Pt was sent to [facility] for further evaluation.

## 2018-08-15 NOTE — Discharge Instructions (Signed)
Return to care  °· If you have heavier bleeding that soaks through more that 2 pads per hour for an hour or more °· If you bleed so much that you feel like you might pass out or you do pass out °· If you have significant abdominal pain that is not improved with Tylenol  °· If you develop a fever > 100.5 ° ° ° °Miscarriage °A miscarriage is the loss of an unborn baby (fetus) before the 20th week of pregnancy. Most miscarriages happen during the first 3 months of pregnancy. Sometimes, a miscarriage can happen before a woman knows that she is pregnant. °Having a miscarriage can be an emotional experience. If you have had a miscarriage, talk with your health care provider about any questions you may have about miscarrying, the grieving process, and your plans for future pregnancy. °What are the causes? °A miscarriage may be caused by: °· Problems with the genes or chromosomes of the fetus. These problems make it impossible for the baby to develop normally. They are often the result of random errors that occur early in the development of the baby, and are not passed from parent to child (not inherited). °· Infection of the cervix or uterus. °· Conditions that affect hormone balance in the body. °· Problems with the cervix, such as the cervix opening and thinning before pregnancy is at term (cervical insufficiency). °· Problems with the uterus. These may include: °? A uterus with an abnormal shape. °? Fibroids in the uterus. °? Congenital abnormalities. These are problems that were present at birth. °· Certain medical conditions. °· Smoking, drinking alcohol, or using drugs. °· Injury (trauma). °In many cases, the cause of a miscarriage is not known. °What are the signs or symptoms? °Symptoms of this condition include: °· Vaginal bleeding or spotting, with or without cramps or pain. °· Pain or cramping in the abdomen or lower back. °· Passing fluid, tissue, or blood clots from the vagina. °How is this diagnosed? °This  condition may be diagnosed based on: °· A physical exam. °· Ultrasound. °· Blood tests. °· Urine tests. °How is this treated? °Treatment for a miscarriage is sometimes not necessary if you naturally pass all the tissue that was in your uterus. If necessary, this condition may be treated with: °· Dilation and curettage (D&C). This is a procedure in which the cervix is stretched open and the lining of the uterus (endometrium) is scraped. This is done only if tissue from the fetus or placenta remains in the body (incomplete miscarriage). °· Medicines, such as: °? Antibiotic medicine, to treat infection. °? Medicine to help the body pass any remaining tissue. °? Medicine to reduce (contract) the size of the uterus. These medicines may be given if you have a lot of bleeding. °If you have Rh negative blood and your baby was Rh positive, you will need a shot of a medicine called Rh immunoglobulinto protect your future babies from Rh blood problems. "Rh-negative" and "Rh-positive" refer to whether or not the blood has a specific protein found on the surface of red blood cells (Rh factor). °Follow these instructions at home: °Medicines ° °· Take over-the-counter and prescription medicines only as told by your health care provider. °· If you were prescribed antibiotic medicine, take it as told by your health care provider. Do not stop taking the antibiotic even if you start to feel better. °· Do not take NSAIDs, such as aspirin and ibuprofen, unless they are approved by your health care provider.   These medicines can cause bleeding. °Activity °· Rest as directed. Ask your health care provider what activities are safe for you. °· Have someone help with home and family responsibilities during this time. °General instructions °· Keep track of the number of sanitary pads you use each day and how soaked (saturated) they are. Write down this information. °· Monitor the amount of tissue or blood clots that you pass from your vagina.  Save any large amounts of tissue for your health care provider to examine. °· Do not use tampons, douche, or have sex until your health care provider approves. °· To help you and your partner with the process of grieving, talk with your health care provider or seek counseling. °· When you are ready, meet with your health care provider to discuss any important steps you should take for your health. Also, discuss steps you should take to have a healthy pregnancy in the future. °· Keep all follow-up visits as told by your health care provider. This is important. °Where to find more information °· The American Congress of Obstetricians [employer] Gynecologists: www.acog.org °· [place] Department of Health and Human Services Office of Women’s Health: www.womenshealth.gov °Contact a health care provider if: °· You have a fever or chills. °· You have a foul smelling vaginal discharge. °· You have more bleeding instead of less. °Get help right away if: °· You have severe cramps or pain in your back or abdomen. °· You pass blood clots or tissue from your vagina that is walnut-sized or larger. °· You soak more than 1 regular sanitary pad in an hour. °· You become light-headed or weak. °· You pass out. °· You have feelings of sadness that take over your thoughts, or you have thoughts of hurting yourself. °Summary °· Most miscarriages happen in the first 3 months of pregnancy. Sometimes miscarriage happens before a woman even knows that she is pregnant. °· Follow your health care provider's instruction for home care. Keep all follow-up appointments. °· To help you and your partner with the process of grieving, talk with your health care provider or seek counseling. °This information is not intended to replace advice given to you by your health care provider. Make sure you discuss any questions you have with your health care provider. °Document Released: 11/08/2000 Document Revised: 06/20/2016 Document Reviewed: 06/20/2016 °Elsevier  Interactive Patient Education © 2019 [employer] ° °

## 2018-08-15 NOTE — Progress Notes (Signed)
Stacey [name] came to registar and asked to be seen for vaginal bleeding.  Stacey [name] had a positive pregnancy test in our office 07/24/18. She is 9wk 2d. She c/o dark brown vaginal bleeding for about a week and pelvic pain =3.  Discussed with Dr. Vergie Living and advised patient she needs to be evaluated at [facility] for possible ectopic pregnancy. She voices understanding. Stacey Eustache,RN

## 2018-08-16 LAB — HIV ANTIBODY (ROUTINE TESTING W REFLEX): HIV Screen 4th Generation wRfx: NONREACTIVE

## 2018-08-17 LAB — GC/CHLAMYDIA PROBE AMP (~~LOC~~) NOT AT ARMC
Chlamydia: NEGATIVE
Neisseria Gonorrhea: NEGATIVE

## 2018-09-02 ENCOUNTER — Telehealth: Payer: Self-pay | Admitting: Family Medicine

## 2018-09-02 ENCOUNTER — Ambulatory Visit: Payer: Medicaid Other | Admitting: Advanced Practice Midwife

## 2018-09-02 NOTE — Telephone Encounter (Signed)
Called the patient to inform of the app download and answer any questions concerning the download. She has no questions at this time.

## 2018-09-02 NOTE — Telephone Encounter (Signed)
Left a detailed message informing the patient the visit is scheduled for April 7 at 9:55am

## 2018-09-03 ENCOUNTER — Ambulatory Visit (INDEPENDENT_AMBULATORY_CARE_PROVIDER_SITE_OTHER): Payer: Medicaid Other | Admitting: Medical

## 2018-09-03 ENCOUNTER — Other Ambulatory Visit: Payer: Self-pay

## 2018-09-03 DIAGNOSIS — O039 Complete or unspecified spontaneous abortion without complication: Secondary | ICD-10-CM | POA: Diagnosis not present

## 2018-09-03 NOTE — Progress Notes (Signed)
I connected with@ on 09/03/18 at  9:15 AM EDT by: WebEx and verified that I am speaking with the correct person using two identifiers.  Patient is located at home and provider is located at [facility].     The purpose of this virtual visit is to provide medical care while limiting exposure to the novel coronavirus. I discussed the limitations, risks, security and privacy concerns of performing an evaluation and management service by WebEx and the availability of in person appointments. I also discussed with the patient that there may be a patient responsible charge related to this service. By engaging in this virtual visit, you consent to the provision of healthcare.  Additionally, you authorize for your insurance to be billed for the services provided during this visit.  The patient expressed understanding and agreed to proceed.  History:  [name] is a 33 y.o. G2P1001 is having a virtual visit today for follow-up after SAB. The patient states that she had heavy bleeding with clots last night, but it has since resolved. She denies pain, fever, bleeding today.   The following portions of the patient's history were reviewed and updated as appropriate: allergies, current medications, family history, past medical history, social history, past surgical history and problem list.  Review of Systems:  Review of Systems  Constitutional: Negative for fever.  Gastrointestinal: Negative for abdominal pain.  Genitourinary:       Neg - vaginal bleeding   Objective:  Physical Exam LMP 06/10/2018 (Exact Date)  Physical Exam  Nursing note reviewed. Constitutional: She is oriented to person, place, and time. She appears well-developed and well-nourished. No distress.  Neurological: She is alert and oriented to person, place, and time.    Assessment & Plan:  1. SAB (spontaneous abortion) - Patient will follow-up with hCG level Thursday at [facility]  - Bleeding precautions discussed  - Patient will  follow-up accordingly after hCG levels are determined this week   Marny Lowenstein, PA-C 09/03/2018 10:48 AM     [facility] for labs on Thursday

## 2018-09-05 ENCOUNTER — Other Ambulatory Visit: Payer: Self-pay

## 2018-09-05 ENCOUNTER — Other Ambulatory Visit: Payer: Medicaid Other

## 2018-09-05 DIAGNOSIS — O209 Hemorrhage in early pregnancy, unspecified: Secondary | ICD-10-CM

## 2018-09-05 DIAGNOSIS — O039 Complete or unspecified spontaneous abortion without complication: Secondary | ICD-10-CM

## 2018-09-06 LAB — HCG, SERUM, QUALITATIVE: hCG,Beta Subunit,Qual,Serum: POSITIVE m[IU]/mL — AB (ref <6)

## 2018-09-10 LAB — SPECIMEN STATUS REPORT

## 2018-09-10 LAB — BETA HCG QUANT (REF LAB): hCG Quant: 1152 m[IU]/mL

## 2018-09-12 ENCOUNTER — Other Ambulatory Visit: Payer: Medicaid Other

## 2018-09-12 ENCOUNTER — Other Ambulatory Visit: Payer: Self-pay

## 2018-09-12 DIAGNOSIS — O039 Complete or unspecified spontaneous abortion without complication: Secondary | ICD-10-CM

## 2018-09-12 NOTE — Progress Notes (Signed)
hcg 

## 2018-09-13 ENCOUNTER — Encounter: Payer: Medicaid Other | Admitting: Advanced Practice Midwife

## 2018-09-13 LAB — BETA HCG QUANT (REF LAB): hCG Quant: 542 m[IU]/mL

## 2018-09-19 ENCOUNTER — Other Ambulatory Visit: Payer: Medicaid Other

## 2018-09-19 ENCOUNTER — Other Ambulatory Visit: Payer: Self-pay

## 2018-09-19 DIAGNOSIS — O039 Complete or unspecified spontaneous abortion without complication: Secondary | ICD-10-CM

## 2018-09-20 LAB — BETA HCG QUANT (REF LAB): hCG Quant: 355 m[IU]/mL

## 2018-09-30 ENCOUNTER — Other Ambulatory Visit: Payer: Medicaid Other

## 2018-09-30 ENCOUNTER — Other Ambulatory Visit: Payer: Self-pay | Admitting: *Deleted

## 2018-09-30 ENCOUNTER — Other Ambulatory Visit: Payer: Self-pay

## 2018-09-30 DIAGNOSIS — O039 Complete or unspecified spontaneous abortion without complication: Secondary | ICD-10-CM

## 2018-10-01 LAB — BETA HCG QUANT (REF LAB): hCG Quant: 172 m[IU]/mL

## 2018-10-25 IMAGING — US US MFM OB FOLLOW-UP
1 series · 13 of 28 positions shown · non-contrast
Comparison: none

[Series 1: us mfm ob follow-up · 73 acquisitions, 13 frames shown]
[im 3/73]
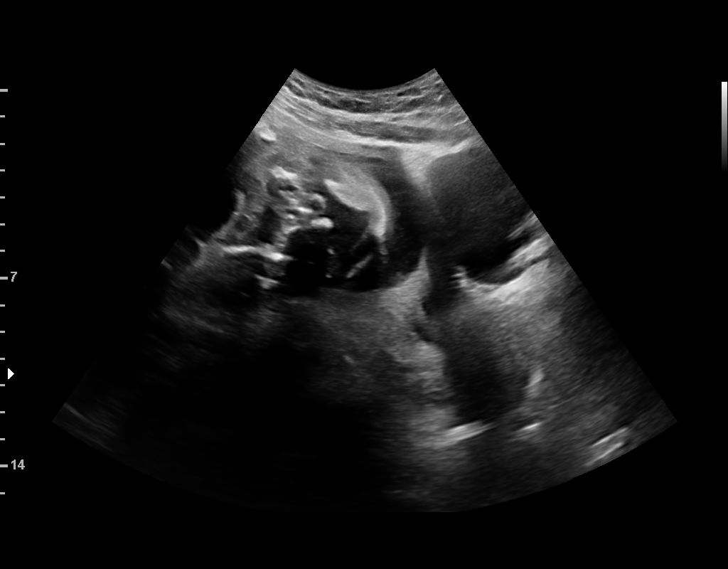
[im 9/73]
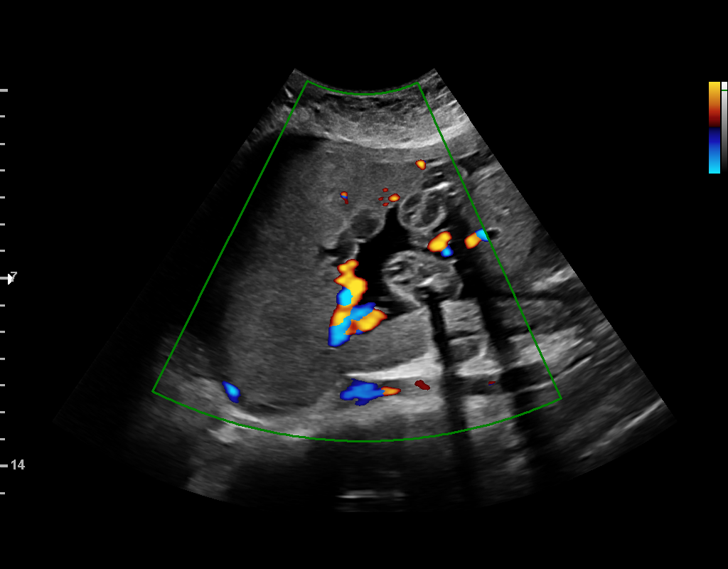
[im 14/73]
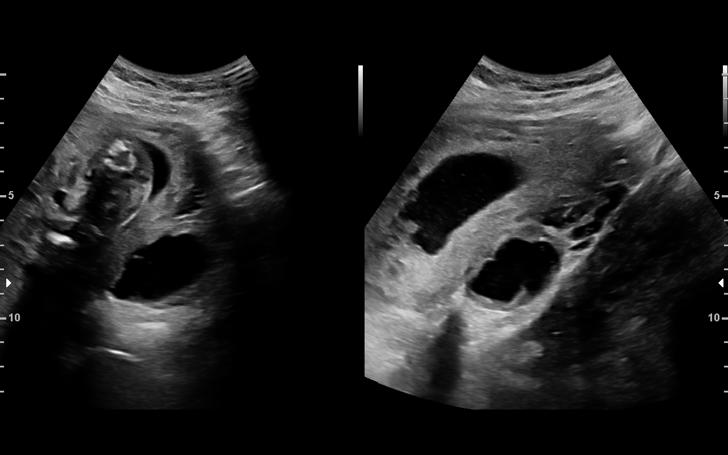
[im 19/73]
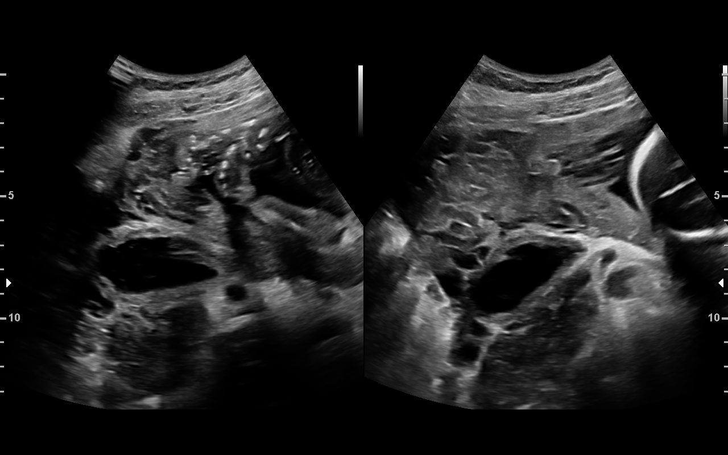
[im 25/73]
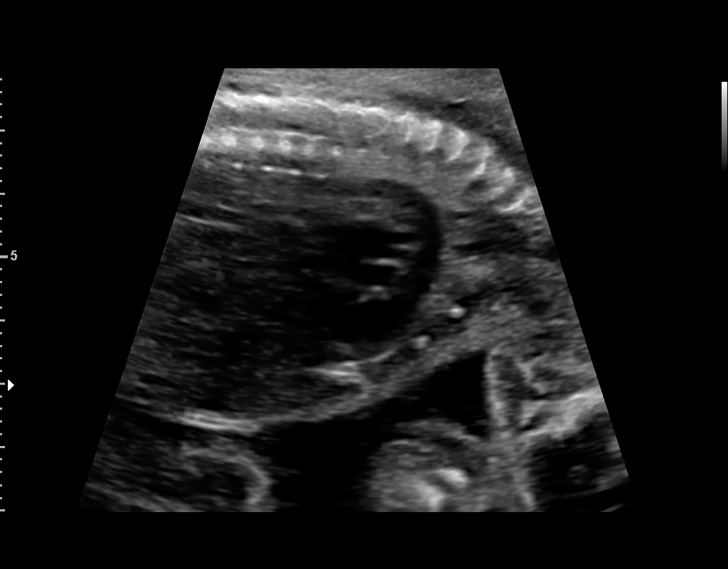
[im 30/73]
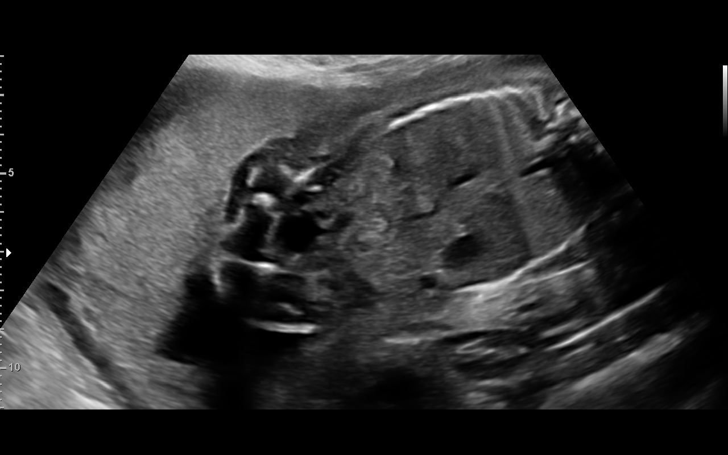
[im 38/73]
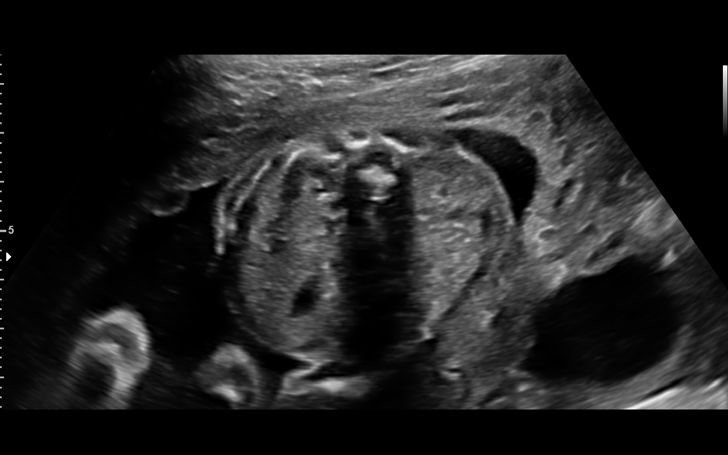
[im 43/73]
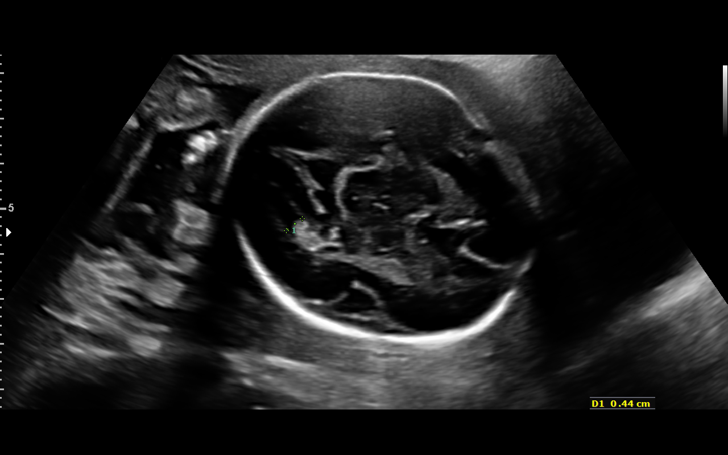
[im 49/73]
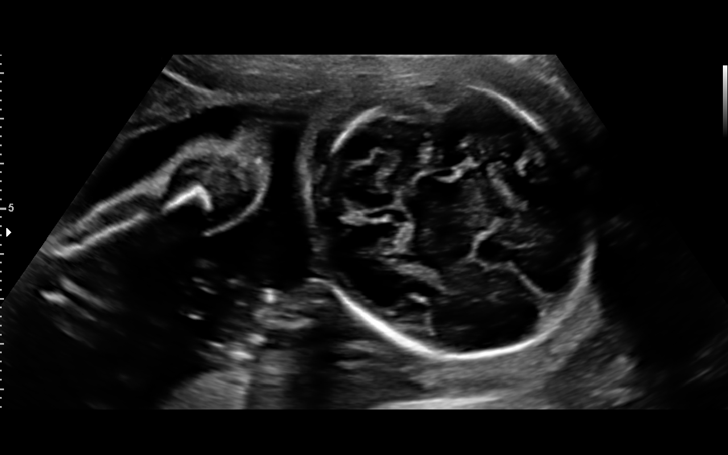
[im 54/73]
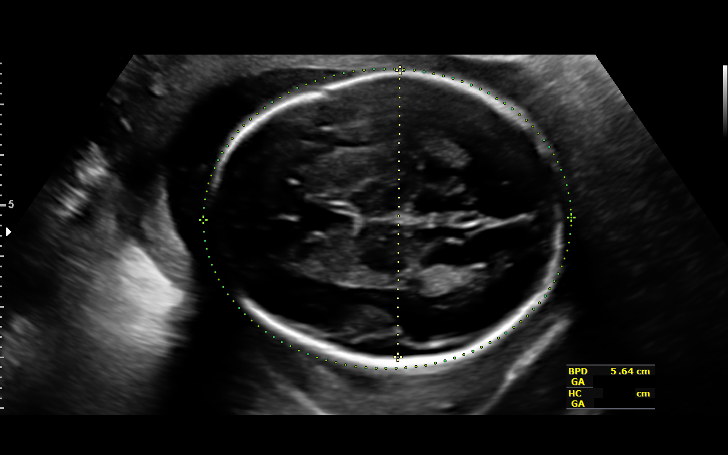
[im 59/73]
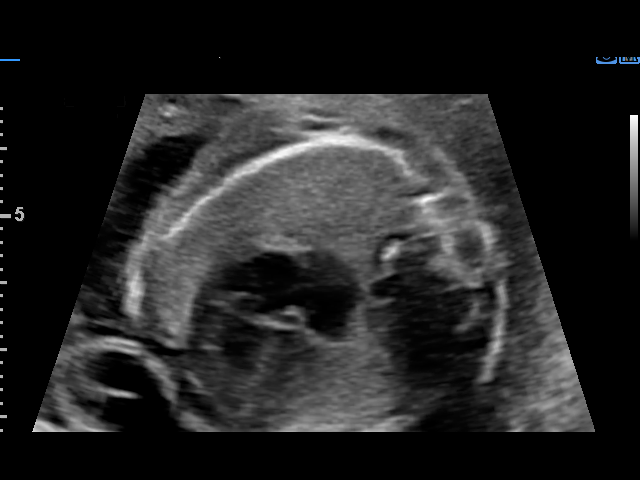
[im 65/73]
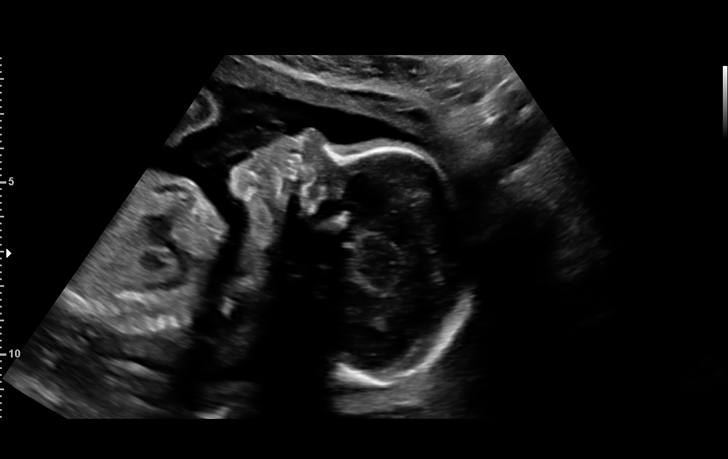
[im 70/73]
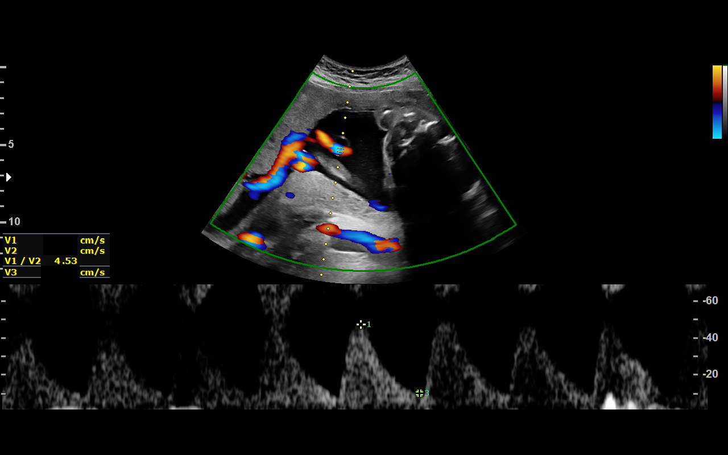

[13 of 28 positions shown; findings below may reference images not displayed]

OB/Gyn Clinic

1  Epley Dissing               [id number]      [id number]     [id number]
Indications

23 weeks gestation of pregnancy
Evaluate anatomy not seen on prior
sonogram
History of sickle cell trait/thalassemia a
OB History

Blood Type:            Height:  5'6"   Weight (lb):  182       BMI:
Gravidity:    1
Fetal Evaluation

Num Of Fetuses:     1
Fetal Heart         150
Rate(bpm):
Cardiac Activity:   Observed
Presentation:       Cephalic
Placenta:           Fundal, above cervical os
P. Cord Insertion:  Visualized

Amniotic Fluid
AFI FV:      Subjectively within normal limits

Largest Pocket(cm)
4.78
Biometry
BPD:      56.6  mm     G. Age:  23w 2d         45  %    CI:        75.22   %    70 - 86
FL/HC:      19.1   %    19.2 -
HC:       207   mm     G. Age:  22w 6d         19  %    HC/AC:      1.18        1.05 -
AC:      175.3  mm     G. Age:  22w 3d         19  %    FL/BPD:     69.8   %    71 - 87
FL:       39.5  mm     G. Age:  22w 5d         22  %    FL/AC:      22.5   %    20 - 24
HUM:      37.9  mm     G. Age:  23w 2d         44  %

Est. FW:     520  gm      1 lb 2 oz     37  %
Gestational Age

LMP:           24w 0d        Date:  10/06/16                 EDD:   07/13/17
U/S Today:     22w 6d                                        EDD:   07/21/17
Best:          23w 2d     Det. By:  U/S  (02/19/17)          EDD:   07/18/17
Anatomy

Cranium:               Appears normal         Aortic Arch:            Appears normal
Cavum:                 Appears normal         Ductal Arch:            Appears normal
Ventricles:            Appears normal         Diaphragm:              Appears normal
Choroid Plexus:        Appears normal         Stomach:                Appears normal, left
sided
Cerebellum:            Appears normal         Abdomen:                Appears normal
Posterior Fossa:       Appears normal         Abdominal Wall:         Previously seen
Nuchal Fold:           Previously seen        Cord Vessels:           Appears normal (3
vessel cord)
Face:                  Appears normal         Kidneys:                Appear normal
(orbits and profile)
Lips:                  Appears normal         Bladder:                Appears normal
Thoracic:              Appears normal         Spine:                  Previously seen
Heart:                 Appears normal         Upper Extremities:      Previously seen
(4CH, axis, and
situs)
RVOT:                  Appears normal         Lower Extremities:      Previously seen
LVOT:                  Appears normal

Other:  Fetus appears to be a male. Heels  previously visualized. 5th digits
visualized. Technically difficult due to fetal position.
Doppler - Fetal Vessels

Umbilical Artery
S/D     %tile                                            ADFV    RDFV
4.5       85                                                No      No

Cervix Uterus Adnexa

Cervix
Length:            4.4  cm.
Normal appearance by transabdominal scan.

Uterus
No abnormality visualized.

Left Ovary
Simple cyst measuring 4.3 x 3.0 x 2.3 cm.
Right Ovary
Simple cyst, measuring 4.9 x 2.9 x 2.3 cm.

Cul De Sac:   No free fluid seen.

Adnexa:       No abnormality visualized.
Impression

Singleton intrauterine pregnancy at 23 weeks 2 days
gestation with fetal cardiac activity
Cephalic presentation
Fundal presentation
Normal appearing fetal growth and amniotic fluid volume
Completion of fetal anatomic survey
Normal appearing cervical length
Patient unsure of LMP so first ultrasound used for dating with
EDD of 07/18/2017
Recommendations

Follow-up ultrasounds as clinically indicated by medical
conditions in pregnancy

## 2018-10-26 ENCOUNTER — Other Ambulatory Visit: Payer: Self-pay

## 2018-10-26 ENCOUNTER — Inpatient Hospital Stay (HOSPITAL_COMMUNITY)
Admission: AD | Admit: 2018-10-26 | Discharge: 2018-10-26 | Disposition: A | Discharge status: Home / Self Care | Payer: Medicaid Other | Attending: Obstetrics and Gynecology | Admitting: Obstetrics and Gynecology

## 2018-10-26 DIAGNOSIS — O039 Complete or unspecified spontaneous abortion without complication: Secondary | ICD-10-CM | POA: Diagnosis present

## 2018-10-26 DIAGNOSIS — Z3202 Encounter for pregnancy test, result negative: Secondary | ICD-10-CM | POA: Insufficient documentation

## 2018-10-26 LAB — POCT PREGNANCY, URINE: Preg Test, Ur: NEGATIVE

## 2018-10-26 NOTE — Discharge Instructions (Signed)
Miscarriage  A miscarriage is the loss of an unborn baby (fetus) before the 20th week of pregnancy. Most miscarriages happen during the first 3 months of pregnancy. Sometimes, a miscarriage can happen before a woman knows that she is pregnant.  Having a miscarriage can be an emotional experience. If you have had a miscarriage, talk with your health care provider about any questions you may have about miscarrying, the grieving process, and your plans for future pregnancy.  What are the causes?  A miscarriage may be caused by:  · Problems with the genes or chromosomes of the fetus. These problems make it impossible for the baby to develop normally. They are often the result of random errors that occur early in the development of the baby, and are not passed from parent to child (not inherited).  · Infection of the cervix or uterus.  · Conditions that affect hormone balance in the body.  · Problems with the cervix, such as the cervix opening and thinning before pregnancy is at term (cervical insufficiency).  · Problems with the uterus. These may include:  ? A uterus with an abnormal shape.  ? Fibroids in the uterus.  ? Congenital abnormalities. These are problems that were present at birth.  · Certain medical conditions.  · Smoking, drinking alcohol, or using drugs.  · Injury (trauma).  In many cases, the cause of a miscarriage is not known.  What are the signs or symptoms?  Symptoms of this condition include:  · Vaginal bleeding or spotting, with or without cramps or pain.  · Pain or cramping in the abdomen or lower back.  · Passing fluid, tissue, or blood clots from the vagina.  How is this diagnosed?  This condition may be diagnosed based on:  · A physical exam.  · Ultrasound.  · Blood tests.  · Urine tests.  How is this treated?  Treatment for a miscarriage is sometimes not necessary if you naturally pass all the tissue that was in your uterus. If necessary, this condition may be treated with:  · Dilation and  curettage (D&C). This is a procedure in which the cervix is stretched open and the lining of the uterus (endometrium) is scraped. This is done only if tissue from the fetus or placenta remains in the body (incomplete miscarriage).  · Medicines, such as:  ? Antibiotic medicine, to treat infection.  ? Medicine to help the body pass any remaining tissue.  ? Medicine to reduce (contract) the size of the uterus. These medicines may be given if you have a lot of bleeding.  If you have Rh negative blood and your baby was Rh positive, you will need a shot of a medicine called Rh immunoglobulinto protect your future babies from Rh blood problems. "Rh-negative" and "Rh-positive" refer to whether or not the blood has a specific protein found on the surface of red blood cells (Rh factor).  Follow these instructions at home:  Medicines    · Take over-the-counter and prescription medicines only as told by your health care provider.  · If you were prescribed antibiotic medicine, take it as told by your health care provider. Do not stop taking the antibiotic even if you start to feel better.  · Do not take NSAIDs, such as aspirin and ibuprofen, unless they are approved by your health care provider. These medicines can cause bleeding.  Activity  · Rest as directed. Ask your health care provider what activities are safe for you.  · Have someone   help with home and family responsibilities during this time.  General instructions  · Keep track of the number of sanitary pads you use each day and how soaked (saturated) they are. Write down this information.  · Monitor the amount of tissue or blood clots that you pass from your vagina. Save any large amounts of tissue for your health care provider to examine.  · Do not use tampons, douche, or have sex until your health care provider approves.  · To help you and your partner with the process of grieving, talk with your health care provider or seek counseling.  · When you are ready, meet with  your health care provider to discuss any important steps you should take for your health. Also, discuss steps you should take to have a healthy pregnancy in the future.  · Keep all follow-up visits as told by your health care provider. This is important.  Where to find more information  · The American Congress of Obstetricians and Gynecologists: www.acog.org  · U.[place] Department of Health and Human Services Office of Women’s Health: www.womenshealth.gov  Contact a health care provider if:  · You have a fever or chills.  · You have a foul smelling vaginal discharge.  · You have more bleeding instead of less.  Get help right away if:  · You have severe cramps or pain in your back or abdomen.  · You pass blood clots or tissue from your vagina that is walnut-sized or larger.  · You soak more than 1 regular sanitary pad in an hour.  · You become light-headed or weak.  · You pass out.  · You have feelings of sadness that take over your thoughts, or you have thoughts of hurting yourself.  Summary  · Most miscarriages happen in the first 3 months of pregnancy. Sometimes miscarriage happens before a woman even knows that she is pregnant.  · Follow your health care provider's instruction for home care. Keep all follow-up appointments.  · To help you and your partner with the process of grieving, talk with your health care provider or seek counseling.  This information is not intended to replace advice given to you by your health care provider. Make sure you discuss any questions you have with your health care provider.  Document Released: 11/08/2000 Document Revised: 06/20/2016 Document Reviewed: 06/20/2016  Elsevier Interactive Patient Education © 2019 Elsevier Inc.

## 2018-10-26 NOTE — MAU Note (Signed)
Stacey [name] is a 33 y.o. here in MAU reporting: states she thinks she had a miscarriage recently, hcg were being followed. Would like to know what's going on and see if all the pregnancy has been passed. No pain or bleeding. States she is having a white discharge, no odor or itching. Has not had a new period yet.  Pain score: 0/10  Vitals:   10/26/18 1451  BP: 127/65  Pulse: 100  Resp: 16  Temp: 98.4 F (36.9 C)  SpO2: 99%      Lab orders placed from triage: none

## 2018-10-26 NOTE — MAU Provider Note (Signed)
  S:   33 y.o. G2P1001 @[redacted]w[redacted]d  by LMP presents to MAU for pregnancy confirmation.  She denies abdominal pain or vaginal bleeding today. She was diagnosed with a miscarriage last month. Hormone levels were being followed in the office. Last check was 5/4=172. Has not taken home pregnancy test since then. States she just wants to know if the pregnancy is over since she hasn't had a period yet.   O: BP 127/65 (BP Location: Right Arm)   Pulse 100   Temp 98.4 F (36.9 C) (Oral)   Resp 16   Ht 5\' 6"  (1.676 m)   Wt 84.6 kg   LMP 06/10/2018 (Exact Date)   SpO2 99%   BMI 30.12 kg/m  Physical Examination: General appearance - alert, well appearing, and in no distress, oriented to person, place, and time and acyanotic, in no respiratory distress  Results for orders placed or performed during the hospital encounter of 10/26/18 (from the past 48 hour(s))  Pregnancy, urine POC     Status: None   Collection Time: 10/26/18  3:03 PM  Result Value Ref Range   Preg Test, Ur NEGATIVE NEGATIVE    Comment:        THE SENSITIVITY OF THIS METHODOLOGY IS >24 mIU/mL     A: 1. Miscarriage   2. Negative pregnancy test       P: D/C home Negative pregnancy test Pt reassured Discussed reasons to return to MAU vs office  Judeth Horn, NP 3:23 PM

## 2018-12-24 ENCOUNTER — Inpatient Hospital Stay (HOSPITAL_COMMUNITY)
Admission: AD | Admit: 2018-12-24 | Discharge: 2018-12-24 | Disposition: A | Discharge status: Home / Self Care | Payer: Medicaid Other | Attending: Obstetrics and Gynecology | Admitting: Obstetrics and Gynecology

## 2018-12-24 ENCOUNTER — Other Ambulatory Visit: Payer: Self-pay

## 2018-12-24 ENCOUNTER — Encounter (HOSPITAL_COMMUNITY): Payer: Self-pay | Admitting: *Deleted

## 2018-12-24 DIAGNOSIS — Z3A01 Less than 8 weeks gestation of pregnancy: Secondary | ICD-10-CM | POA: Diagnosis not present

## 2018-12-24 DIAGNOSIS — O210 Mild hyperemesis gravidarum: Secondary | ICD-10-CM | POA: Diagnosis present

## 2018-12-24 DIAGNOSIS — O219 Vomiting of pregnancy, unspecified: Secondary | ICD-10-CM | POA: Diagnosis not present

## 2018-12-24 LAB — URINALYSIS, ROUTINE W REFLEX MICROSCOPIC
Bilirubin Urine: NEGATIVE
Glucose, UA: NEGATIVE mg/dL
Hgb urine dipstick: NEGATIVE
Ketones, ur: NEGATIVE mg/dL
Leukocytes,Ua: NEGATIVE
Nitrite: NEGATIVE
Protein, ur: NEGATIVE mg/dL
Specific Gravity, Urine: 1.015 (ref 1.005–1.030)
pH: 6 (ref 5.0–8.0)

## 2018-12-24 LAB — POCT PREGNANCY, URINE: Preg Test, Ur: POSITIVE — AB

## 2018-12-24 MED ORDER — METOCLOPRAMIDE HCL 10 MG PO TABS: 10.0000 mg | ORAL_TABLET | Freq: Three times a day (TID) | ORAL | 1 refills | Status: DC

## 2018-12-24 MED ORDER — PROMETHAZINE HCL 25 MG PO TABS: 25.0000 mg | ORAL_TABLET | Freq: Every evening | ORAL | 0 refills | Status: DC | PRN

## 2018-12-24 MED ORDER — SCOPOLAMINE 1 MG/3DAYS TD PT72: 1.0000 | MEDICATED_PATCH | TRANSDERMAL | 1 refills | Status: DC

## 2018-12-24 NOTE — MAU Note (Signed)
.   Stacey [name] is a 33 y.o. at [redacted]w[redacted]d here in MAU reporting: vomiting for 3 weeks.  LMP: 11/04/18 Onset of complaint: 3 weeks Pain score: 0 Vitals:   12/24/18 1434 12/24/18 1435  BP: (!) 134/59   Pulse: 82   Resp: 16   Temp:    SpO2:  100%     FHT: Lab orders placed from triageUA/UPT:

## 2018-12-24 NOTE — MAU Provider Note (Signed)
History     CSN: [id number]  Arrival date and time: 12/24/18 1345   None     Chief Complaint  Patient presents with  . Emesis   HPI   Stacey [name] is a 33 y.o. female G3P1001 @ 4256w1d here with nausea and vomiting. Says she cannot keep much down. Says she drinks water that comes up. She has not tried any medications for the symptoms. She has no pain or bleeding. Patient waiting in the lobby and says she cannot wait any longer. Requesting something for vomiting/nausea before she leaves MAU.   OB History    Gravida  3   Para  1   Term  1   Preterm      AB      Living  1     SAB      TAB      Ectopic      Multiple  0   Live Births  1           Past Medical History:  Diagnosis Date  . Medical history non-contributory     Past Surgical History:  Procedure Laterality Date  . NO PAST SURGERIES      Family History  Problem Relation Age of Onset  . Hypertension Mother     Social History   Tobacco Use  . Smoking status: Never Smoker  . Smokeless tobacco: Never Used  Substance Use Topics  . Alcohol use: No  . Drug use: No    Allergies: No Known Allergies  Medications Prior to Admission  Medication Sig Dispense Refill Last Dose  . acetaminophen (TYLENOL) 325 MG tablet Take 650 mg by mouth every 6 (six) hours as needed for moderate pain.     . prenatal vitamin w/FE, FA (PRENATAL 1 + 1) 27-1 MG TABS tablet Take 1 tablet by mouth daily at 12 noon.      Results for orders placed or performed during the hospital encounter of 12/24/18 (from the past 48 hour(s))  Urinalysis, Routine w reflex microscopic     Status: Abnormal   Collection Time: 12/24/18  2:22 PM  Result Value Ref Range   Color, Urine YELLOW YELLOW   APPearance HAZY (A) CLEAR   Specific Gravity, Urine 1.015 1.005 - 1.030   pH 6.0 5.0 - 8.0   Glucose, UA NEGATIVE NEGATIVE mg/dL   Hgb urine dipstick NEGATIVE NEGATIVE   Bilirubin Urine NEGATIVE NEGATIVE   Ketones, ur NEGATIVE  NEGATIVE mg/dL   Protein, ur NEGATIVE NEGATIVE mg/dL   Nitrite NEGATIVE NEGATIVE   Leukocytes,Ua NEGATIVE NEGATIVE    Comment: Performed at [facility], [address] [place], [place] [id number]  Pregnancy, urine POC     Status: Abnormal   Collection Time: 12/24/18  2:28 PM  Result Value Ref Range   Preg Test, Ur POSITIVE (A) NEGATIVE    Comment:        THE SENSITIVITY OF THIS METHODOLOGY IS >24 mIU/mL    Review of Systems  Gastrointestinal: Positive for nausea and vomiting. Negative for abdominal pain.  Genitourinary: Negative for vaginal bleeding.   Physical Exam   Blood pressure (!) 134/59, pulse 82, temperature 98.3 F (36.8 C), resp. rate 16, weight 85.3 kg, last menstrual period 11/04/2018, SpO2 100 %, unknown if currently breastfeeding.  Physical Exam  Constitutional: She is oriented to person, place, and time. She appears well-developed and well-nourished. No distress.  Respiratory: Effort normal.  Neurological: She is alert and oriented to person, place,  and time.  Skin: She is not diaphoretic.  Psychiatric: Her behavior is normal.   MAU Course  Procedures  None  MDM  UA without signs of dehydration.    Assessment and Plan   A:  1. Nausea and vomiting in pregnancy   2. [redacted] weeks gestation of pregnancy     P:  Discharge home in stable condition Small, frequent meals Rx: scopolamine patch, reglan, phenergan at night only Return to MAU if symptoms worsen   Rasch, Artist Pais, NP 12/24/2018 8:21 PM

## 2018-12-24 NOTE — Discharge Instructions (Signed)
Morning Sickness  Morning sickness is when you feel sick to your stomach (nauseous) during pregnancy. You may feel sick to your stomach and throw up (vomit). You may feel sick in the morning, but you can feel this way at any time of day. Some women feel very sick to their stomach and cannot stop throwing up (hyperemesis gravidarum). Follow these instructions at home: Medicines  Take over-the-counter and prescription medicines only as told by your doctor. Do not take any medicines until you talk with your doctor about them first.  Taking multivitamins before getting pregnant can stop or lessen the harshness of morning sickness. Eating and drinking  Eat dry toast or crackers before getting out of bed.  Eat 5 or 6 small meals a day.  Eat dry and bland foods like rice and baked potatoes.  Do not eat greasy, fatty, or spicy foods.  Have someone cook for you if the smell of food causes you to feel sick or throw up.  If you feel sick to your stomach after taking prenatal vitamins, take them at night or with a snack.  Eat protein when you need a snack. Nuts, yogurt, and cheese are good choices.  Drink fluids throughout the day.  Try ginger ale made with real ginger, ginger tea made from fresh grated ginger, or ginger candies. General instructions  Do not use any products that have nicotine or tobacco in them, such as cigarettes and e-cigarettes. If you need help quitting, ask your doctor.  Use an air purifier to keep the air in your house free of smells.  Get lots of fresh air.  Try to avoid smells that make you feel sick.  Try: ? Wearing a bracelet that is used for seasickness (acupressure wristband). ? Going to a doctor who puts thin needles into certain body points (acupuncture) to improve how you feel. Contact a doctor if:  You need medicine to feel better.  You feel dizzy or light-headed.  You are losing weight. Get help right away if:  You feel very sick to your  stomach and cannot stop throwing up.  You pass out (faint).  You have very bad pain in your belly. Summary  Morning sickness is when you feel sick to your stomach (nauseous) during pregnancy.  You may feel sick in the morning, but you can feel this way at any time of day.  Making some changes to what you eat may help your symptoms go away. This information is not intended to replace advice given to you by your health care provider. Make sure you discuss any questions you have with your health care provider. Document Released: 06/22/2004 Document Revised: 04/27/2017 Document Reviewed: 06/15/2016 Elsevier Patient Education  2020 Elsevier Inc.  

## 2018-12-27 ENCOUNTER — Telehealth: Payer: Self-pay | Admitting: Obstetrics & Gynecology

## 2018-12-27 NOTE — Telephone Encounter (Signed)
The patient visited our office, stated she prefers Friday for appointments.

## 2018-12-31 ENCOUNTER — Other Ambulatory Visit: Payer: Self-pay

## 2018-12-31 ENCOUNTER — Inpatient Hospital Stay (HOSPITAL_COMMUNITY)
Admission: AD | Admit: 2018-12-31 | Discharge: 2018-12-31 | Disposition: A | Discharge status: Home / Self Care | Payer: Medicaid Other | Attending: Obstetrics and Gynecology | Admitting: Obstetrics and Gynecology

## 2018-12-31 DIAGNOSIS — O219 Vomiting of pregnancy, unspecified: Secondary | ICD-10-CM | POA: Diagnosis not present

## 2018-12-31 DIAGNOSIS — Z3A08 8 weeks gestation of pregnancy: Secondary | ICD-10-CM | POA: Insufficient documentation

## 2018-12-31 DIAGNOSIS — O210 Mild hyperemesis gravidarum: Secondary | ICD-10-CM | POA: Diagnosis not present

## 2018-12-31 LAB — URINALYSIS, ROUTINE W REFLEX MICROSCOPIC
Bilirubin Urine: NEGATIVE
Glucose, UA: NEGATIVE mg/dL
Hgb urine dipstick: NEGATIVE
Ketones, ur: NEGATIVE mg/dL
Leukocytes,Ua: NEGATIVE
Nitrite: NEGATIVE
Protein, ur: NEGATIVE mg/dL
Specific Gravity, Urine: 1.019 (ref 1.005–1.030)
pH: 6 (ref 5.0–8.0)

## 2018-12-31 MED ORDER — DOXYLAMINE SUCCINATE (SLEEP) 25 MG PO TABS: 25.0000 mg | ORAL_TABLET | Freq: Every evening | ORAL | 0 refills | Status: DC | PRN

## 2018-12-31 MED ORDER — ONDANSETRON 8 MG PO TBDP: 8.0000 mg | ORAL_TABLET | Freq: Three times a day (TID) | ORAL | 3 refills | Status: DC | PRN

## 2018-12-31 NOTE — Discharge Instructions (Signed)
Morning Sickness  Morning sickness is when you feel sick to your stomach (nauseous) during pregnancy. You may feel sick to your stomach and throw up (vomit). You may feel sick in the morning, but you can feel this way at any time of day. Some women feel very sick to their stomach and cannot stop throwing up (hyperemesis gravidarum). Follow these instructions at home: Medicines  Take over-the-counter and prescription medicines only as told by your doctor. Do not take any medicines until you talk with your doctor about them first.  Taking multivitamins before getting pregnant can stop or lessen the harshness of morning sickness. Eating and drinking  Eat dry toast or crackers before getting out of bed.  Eat 5 or 6 small meals a day.  Eat dry and bland foods like rice and baked potatoes.  Do not eat greasy, fatty, or spicy foods.  Have someone cook for you if the smell of food causes you to feel sick or throw up.  If you feel sick to your stomach after taking prenatal vitamins, take them at night or with a snack.  Eat protein when you need a snack. Nuts, yogurt, and cheese are good choices.  Drink fluids throughout the day.  Try ginger ale made with real ginger, ginger tea made from fresh grated ginger, or ginger candies. General instructions  Do not use any products that have nicotine or tobacco in them, such as cigarettes and e-cigarettes. If you need help quitting, ask your doctor.  Use an air purifier to keep the air in your house free of smells.  Get lots of fresh air.  Try to avoid smells that make you feel sick.  Try: ? Wearing a bracelet that is used for seasickness (acupressure wristband). ? Going to a doctor who puts thin needles into certain body points (acupuncture) to improve how you feel. Contact a doctor if:  You need medicine to feel better.  You feel dizzy or light-headed.  You are losing weight. Get help right away if:  You feel very sick to your  stomach and cannot stop throwing up.  You pass out (faint).  You have very bad pain in your belly. Summary  Morning sickness is when you feel sick to your stomach (nauseous) during pregnancy.  You may feel sick in the morning, but you can feel this way at any time of day.  Making some changes to what you eat may help your symptoms go away. This information is not intended to replace advice given to you by your health care provider. Make sure you discuss any questions you have with your health care provider. Document Released: 06/22/2004 Document Revised: 04/27/2017 Document Reviewed: 06/15/2016 Elsevier Patient Education  2020 Elsevier Inc.  

## 2018-12-31 NOTE — MAU Note (Signed)
Has been vomiting, was given promethazine, it isn't working.  Remembered she had zofran from other preg, wants to know if we can prescribe it for her

## 2018-12-31 NOTE — MAU Provider Note (Signed)
History     CSN: [id number]  Arrival date and time: 12/31/18 1414   First Provider Initiated Contact with Patient 12/31/18 1822      Chief Complaint  Patient presents with  . Emesis   Stacey [name] is a 33 y.o. G3P1001 at 5850w1d who presents today with nausea and vomiting. She states that she does not want to be seen or get IV fluids today. She has been using phenergan and it is not working. She was using zofran and unisom with her last pregnancy. She states that worked well. She would like to try these medications again.   Emesis  This is a new problem. The current episode started 1 to 4 weeks ago. The problem occurs 5 to 10 times per day. The problem has been unchanged. The emesis has an appearance of stomach contents. There has been no fever. Risk factors: pregnancy  Treatments tried: phenergan  The treatment provided no relief.    OB History    Gravida  3   Para  1   Term  1   Preterm      AB      Living  1     SAB      TAB      Ectopic      Multiple  0   Live Births  1           Past Medical History:  Diagnosis Date  . Medical history non-contributory     Past Surgical History:  Procedure Laterality Date  . NO PAST SURGERIES      Family History  Problem Relation Age of Onset  . Hypertension Mother     Social History   Tobacco Use  . Smoking status: Never Smoker  . Smokeless tobacco: Never Used  Substance Use Topics  . Alcohol use: No  . Drug use: No    Allergies: No Known Allergies  Medications Prior to Admission  Medication Sig Dispense Refill Last Dose  . acetaminophen (TYLENOL) 325 MG tablet Take 650 mg by mouth every 6 (six) hours as needed for moderate pain.     Marland Kitchen. metoCLOPramide (REGLAN) 10 MG tablet Take 1 tablet (10 mg total) by mouth 3 (three) times daily before meals. 90 tablet 1   . prenatal vitamin w/FE, FA (PRENATAL 1 + 1) 27-1 MG TABS tablet Take 1 tablet by mouth daily at 12 noon.     . promethazine (PHENERGAN) 25 MG  tablet Take 1 tablet (25 mg total) by mouth at bedtime as needed for nausea or vomiting. 30 tablet 0   . scopolamine (TRANSDERM-SCOP, 1.5 MG,) 1 MG/3DAYS Place 1 patch (1.5 mg total) onto the skin every 3 (three) days. 4 patch 1     Review of Systems  Gastrointestinal: Positive for vomiting.  All other systems reviewed and are negative.  Physical Exam   Blood pressure 110/64, pulse 87, temperature 98.2 F (36.8 C), temperature source Oral, resp. rate 18, weight 84.9 kg, last menstrual period 11/04/2018, SpO2 100 %, unknown if currently breastfeeding.  Physical Exam  Nursing note and vitals reviewed. Constitutional: She is oriented to person, place, and time. She appears well-developed and well-nourished. No distress.  HENT:  Head: Normocephalic.  Cardiovascular: Normal rate.  Respiratory: Effort normal.  Neurological: She is alert and oriented to person, place, and time.  Psychiatric: She has a normal mood and affect.   Results for orders placed or performed during the hospital encounter of 12/31/18 (from the past 24 hour(s))  Urinalysis, Routine w reflex microscopic     Status: Abnormal   Collection Time: 12/31/18  3:48 PM  Result Value Ref Range   Color, Urine YELLOW YELLOW   APPearance HAZY (A) CLEAR   Specific Gravity, Urine 1.019 1.005 - 1.030   pH 6.0 5.0 - 8.0   Glucose, UA NEGATIVE NEGATIVE mg/dL   Hgb urine dipstick NEGATIVE NEGATIVE   Bilirubin Urine NEGATIVE NEGATIVE   Ketones, ur NEGATIVE NEGATIVE mg/dL   Protein, ur NEGATIVE NEGATIVE mg/dL   Nitrite NEGATIVE NEGATIVE   Leukocytes,Ua NEGATIVE NEGATIVE    MAU Course  Procedures  MDM   Assessment and Plan   1. Nausea/vomiting in pregnancy    DC home Comfort measures reviewed  1st Trimester precautions  RX: zofran 8mg  ODT #20 with 3RF, Unisom QHS #30  Return to MAU as needed FU with OB as planned  [place] for [facility] Follow up.   Specialty: Obstetrics  and Gynecology Contact information: [address], [address] [id number] [address] [place] [id number] [place] DNP, CNM  12/31/18  6:26 PM

## 2019-01-17 ENCOUNTER — Telehealth: Payer: Self-pay | Admitting: Family Medicine

## 2019-01-17 NOTE — Telephone Encounter (Signed)
Attempted to call patient about her appointment on 8/24 @ 1:30. No answer, left visceral instructing patient that the appointment is a telephone visit and she did not have to come to office. Patient advised to give the office a call with any concerns.

## 2019-01-20 ENCOUNTER — Other Ambulatory Visit: Payer: Self-pay

## 2019-01-20 ENCOUNTER — Ambulatory Visit (INDEPENDENT_AMBULATORY_CARE_PROVIDER_SITE_OTHER): Payer: Medicaid Other

## 2019-01-20 DIAGNOSIS — Z20822 Contact with and (suspected) exposure to covid-19: Secondary | ICD-10-CM

## 2019-01-20 DIAGNOSIS — O0990 Supervision of high risk pregnancy, unspecified, unspecified trimester: Secondary | ICD-10-CM

## 2019-01-20 DIAGNOSIS — Z3490 Encounter for supervision of normal pregnancy, unspecified, unspecified trimester: Secondary | ICD-10-CM | POA: Insufficient documentation

## 2019-01-20 HISTORY — DX: Supervision of high risk pregnancy, unspecified, unspecified trimester: O09.90

## 2019-01-20 MED ORDER — BLOOD PRESSURE KIT DEVI: 1.0000 | 0 refills | Status: DC | PRN

## 2019-01-20 NOTE — Progress Notes (Signed)
I connected with  Stacey [name] on 01/20/19 at  1:30 PM EDT by telephone and verified that I am speaking with the correct person using two identifiers.   I discussed the limitations, risks, security and privacy concerns of performing an evaluation and management service by telephone and the availability of in person appointments. I also discussed with the patient that there may be a patient responsible charge related to this service. The patient expressed understanding and agreed to proceed.  Pt hx obtained.  Pt able to download Babys[employer]s and is able to successfully place BP value in app.  Pt explained that she will need to place her BP value in the app weekly.  I explained to the pt that she will receive a call from [place] in regards to her BP cuff if she could please answer because they will need to verify her information.  I advised pt that she will get offered genetic screening, GC/CH (pt had pt in 01/2017 that was normal), OB urine cx, Home Medicaid Form, NEW OB Packet, and scheduled anatomy US for Oct 16th @ 1015.  Pt verbalized understanding.   Mel Almond, RN 01/20/2019  1:27 PM

## 2019-01-21 LAB — NOVEL CORONAVIRUS, NAA: SARS-CoV-2, NAA: NOT DETECTED

## 2019-01-21 NOTE — Progress Notes (Signed)
Patient seen and assessed by nursing staff during this encounter. I have reviewed the chart and agree with the documentation and plan.  Mora Bellman, MD 01/21/2019 10:41 AM

## 2019-02-07 ENCOUNTER — Other Ambulatory Visit (HOSPITAL_COMMUNITY)
Admission: RE | Admit: 2019-02-07 | Discharge: 2019-02-07 | Disposition: A | Discharge status: Home / Self Care | Payer: Medicaid Other | Source: Ambulatory Visit | Attending: Medical | Admitting: Medical

## 2019-02-07 ENCOUNTER — Other Ambulatory Visit: Payer: Self-pay

## 2019-02-07 ENCOUNTER — Encounter: Payer: Self-pay | Admitting: Family Medicine

## 2019-02-07 ENCOUNTER — Ambulatory Visit (INDEPENDENT_AMBULATORY_CARE_PROVIDER_SITE_OTHER): Payer: Medicaid Other | Admitting: Family Medicine

## 2019-02-07 VITALS — BP 122/73 | HR 102 | Wt 187.3 lb

## 2019-02-07 DIAGNOSIS — Z3491 Encounter for supervision of normal pregnancy, unspecified, first trimester: Secondary | ICD-10-CM | POA: Diagnosis present

## 2019-02-07 DIAGNOSIS — Z3490 Encounter for supervision of normal pregnancy, unspecified, unspecified trimester: Secondary | ICD-10-CM | POA: Diagnosis not present

## 2019-02-07 DIAGNOSIS — Z23 Encounter for immunization: Secondary | ICD-10-CM | POA: Diagnosis not present

## 2019-02-07 DIAGNOSIS — Z3A13 13 weeks gestation of pregnancy: Secondary | ICD-10-CM

## 2019-02-07 DIAGNOSIS — O219 Vomiting of pregnancy, unspecified: Secondary | ICD-10-CM

## 2019-02-07 DIAGNOSIS — E669 Obesity, unspecified: Secondary | ICD-10-CM

## 2019-02-07 LAB — POCT URINALYSIS DIP (DEVICE)
Bilirubin Urine: NEGATIVE
Glucose, UA: NEGATIVE mg/dL
Hgb urine dipstick: NEGATIVE
Ketones, ur: NEGATIVE mg/dL
Leukocytes,Ua: NEGATIVE
Nitrite: NEGATIVE
Protein, ur: NEGATIVE mg/dL
Specific Gravity, Urine: 1.025 (ref 1.005–1.030)
Urobilinogen, UA: 0.2 mg/dL (ref 0.0–1.0)
pH: 5.5 (ref 5.0–8.0)

## 2019-02-07 NOTE — Patient Instructions (Signed)

## 2019-02-07 NOTE — Addendum Note (Signed)
Addended by: Michel Harrow on: 02/07/2019 12:07 PM   Modules accepted: Orders

## 2019-02-07 NOTE — Progress Notes (Signed)
History:   Kobi Aller is a 33 y.o. G3P1011 at 18w4dby LMP being seen today for her first obstetrical visit.  Her obstetrical history is significant for obesity. Patient does intend to breast feed. Pregnancy history fully reviewed. LMP 6/8; sure LMP  Patient reports feeling nauseous. She is vomiting after almost every meal. She had this in her first pregnancy as well throughout entire pregnancy; otherwise uncomplicated term SVD. Keeping some PO down with Unisom. She is already taking Zofran which helps more than other medications generally.       HISTORY: OB History  Gravida Para Term Preterm AB Living  '3 1 1 ' 0 1 1  SAB TAB Ectopic Multiple Live Births  1 0 0 0 1    # Outcome Date GA Lbr Len/2nd Weight Sex Delivery Anes PTL Lv  3 Current           2 Term 06/27/17 320w0d 00:10 5 lb 15.2 oz (2.699 kg) M Vag-Spont None  LIV     Name: FOJAKI, STEPTOE   Apgar1: 8  Apgar5: 9  1 SAB             Last pap smear was done Sept 2018 and was normal  Past Medical History:  Diagnosis Date   Medical history non-contributory    Past Surgical History:  Procedure Laterality Date   NO PAST SURGERIES     Family History  Problem Relation Age of Onset   Hypertension Mother    Social History   Tobacco Use   Smoking status: Never Smoker   Smokeless tobacco: Never Used  Substance Use Topics   Alcohol use: No   Drug use: No   No Known Allergies Current Outpatient Medications on File Prior to Visit  Medication Sig Dispense Refill   acetaminophen (TYLENOL) 325 MG tablet Take 650 mg by mouth every 6 (six) hours as needed for moderate pain.     Blood Pressure Monitoring (BLOOD PRESSURE KIT) DEVI 1 Device by Does not apply route as needed. ICD 10: Z39.00 1 Device 0   doxylamine, Sleep, (UNISOM) 25 MG tablet Take 1 tablet (25 mg total) by mouth at bedtime as needed. 30 tablet 0   ondansetron (ZOFRAN ODT) 8 MG disintegrating tablet Take 1 tablet (8 mg total) by mouth every 8  (eight) hours as needed for nausea or vomiting. 20 tablet 3   prenatal vitamin w/FE, FA (PRENATAL 1 + 1) 27-1 MG TABS tablet Take 1 tablet by mouth daily at 12 noon.     metoCLOPramide (REGLAN) 10 MG tablet Take 1 tablet (10 mg total) by mouth 3 (three) times daily before meals. (Patient not taking: Reported on 01/20/2019) 90 tablet 1   promethazine (PHENERGAN) 25 MG tablet Take 1 tablet (25 mg total) by mouth at bedtime as needed for nausea or vomiting. (Patient not taking: Reported on 02/07/2019) 30 tablet 0   scopolamine (TRANSDERM-SCOP, 1.5 MG,) 1 MG/3DAYS Place 1 patch (1.5 mg total) onto the skin every 3 (three) days. (Patient not taking: Reported on 02/07/2019) 4 patch 1   No current facility-administered medications on file prior to visit.     Review of Systems Pertinent items noted in HPI and remainder of comprehensive ROS otherwise negative. Physical Exam:   Vitals:   02/07/19 1010  BP: 122/73  Pulse: (!) 102  Weight: 187 lb 4.8 oz (85 kg)   Fetal Heart Rate (bpm): 149 System: General: well-developed, well-nourished female in no acute distress   Breasts:  deferred   Skin: normal coloration and turgor, no rashes   Neurologic: oriented, normal, negative, normal mood   Extremities: normal strength, tone, and muscle mass, ROM of all joints is normal   HEENT extraocular movement intact and sclera clear, anicteric   Mouth/Teeth mucous membranes moist, pharynx normal without lesions and dental hygiene good   Neck supple and no masses   Cardiovascular: regular rate    Respiratory:  no respiratory distress, normal breath sounds   Abdomen: soft, non-tender; bowel sounds normal; no masses,  no organomegaly  Bedside Ultrasound with single IUP. Patient informed that the ultrasound is considered a limited obstetric ultrasound and is not intended to be a complete ultrasound exam.  Patient also informed that the ultrasound is not being completed with the intent of assessing for fetal or  placental anomalies or any pelvic abnormalities.  Explained that the purpose of todays ultrasound is to assess for fetal heart rate.  Patient acknowledges the purpose of the exam and the limitations of the study.     Assessment:    Pregnancy: G3P1011 Patient Active Problem List   Diagnosis Date Noted   Supervision of low-risk pregnancy 01/20/2019   Postpartum care and examination 08/01/2017   Hidradenitis 08/01/2017   Sickle cell trait ([place]) 02/14/2017   Alpha thalassemia trait 02/14/2017     Plan:    1. Encounter for supervision of low-risk pregnancy, antepartum - Genetic Screening - Obstetric Panel, Including HIV - Culture, OB Urine - Hemoglobin A1c - GC/Chlamydia probe amp ([place])not at [facility] - Pap up to date - Anatomy US scheduled - RTC in 4 weeks   2. Nausea and vomiting in pregnancy - Vitals stable - Patient able to tolerate some PO and has already tried Phenergan, Reglan - Currently using Unisom and Zofran - Precautions given to watch for dehydration   Initial labs drawn. Continue prenatal vitamins. Genetic Screening discussed, First trimester screen, Quad screen and NIPS: requested. Ultrasound discussed; fetal anatomic survey: requested previously; scheduled 10/16 Problem list reviewed and updated. The nature of [place] with multiple MDs and other Advanced Practice Providers was explained to patient; also emphasized that residents, students are part of our team. Routine obstetric precautions reviewed. Return in about 4 weeks (around 03/07/2019) for LOB; virtual okay if owns BP cuff or will have by that time .    Barrington Ellison, MD [facility] Family Medicine Fellow, [facility] for [employer], [place]

## 2019-02-08 LAB — OBSTETRIC PANEL, INCLUDING HIV
Antibody Screen: NEGATIVE
Basophils Absolute: 0 10*3/uL (ref 0.0–0.2)
Basos: 1 %
EOS (ABSOLUTE): 0.2 10*3/uL (ref 0.0–0.4)
Eos: 3 %
HIV Screen 4th Generation wRfx: NONREACTIVE
Hematocrit: 39.4 % (ref 34.0–46.6)
Hemoglobin: 12.7 g/dL (ref 11.1–15.9)
Hepatitis B Surface Ag: NEGATIVE
Immature Grans (Abs): 0 10*3/uL (ref 0.0–0.1)
Immature Granulocytes: 0 %
Lymphocytes Absolute: 1.8 10*3/uL (ref 0.7–3.1)
Lymphs: 32 %
MCH: 25.7 pg — ABNORMAL LOW (ref 26.6–33.0)
MCHC: 32.2 g/dL (ref 31.5–35.7)
MCV: 80 fL (ref 79–97)
Monocytes Absolute: 0.5 10*3/uL (ref 0.1–0.9)
Monocytes: 8 %
Neutrophils Absolute: 3.2 10*3/uL (ref 1.4–7.0)
Neutrophils: 56 %
Platelets: 287 10*3/uL (ref 150–450)
RBC: 4.94 x10E6/uL (ref 3.77–5.28)
RDW: 15.4 % (ref 11.7–15.4)
RPR Ser Ql: NONREACTIVE
Rh Factor: POSITIVE
Rubella Antibodies, IGG: 23.3 index (ref >0.99)
WBC: 5.7 10*3/uL (ref 3.4–10.8)

## 2019-02-08 LAB — HEMOGLOBIN A1C
Est. average glucose Bld gHb Est-mCnc: 111 mg/dL
Hgb A1c MFr Bld: 5.5 % (ref 4.8–5.6)

## 2019-02-08 LAB — GC/CHLAMYDIA PROBE AMP (~~LOC~~) NOT AT ARMC
Chlamydia: NEGATIVE
Neisseria Gonorrhea: NEGATIVE

## 2019-02-09 LAB — URINE CULTURE, OB REFLEX: Organism ID, Bacteria: NO GROWTH

## 2019-02-09 LAB — CULTURE, OB URINE

## 2019-02-11 ENCOUNTER — Encounter: Payer: Self-pay | Admitting: *Deleted

## 2019-03-04 ENCOUNTER — Other Ambulatory Visit: Payer: Self-pay

## 2019-03-04 ENCOUNTER — Encounter: Payer: Self-pay | Admitting: *Deleted

## 2019-03-04 ENCOUNTER — Telehealth (INDEPENDENT_AMBULATORY_CARE_PROVIDER_SITE_OTHER): Payer: Medicaid Other | Admitting: Student

## 2019-03-04 DIAGNOSIS — Z3A17 17 weeks gestation of pregnancy: Secondary | ICD-10-CM

## 2019-03-04 DIAGNOSIS — Z3492 Encounter for supervision of normal pregnancy, unspecified, second trimester: Secondary | ICD-10-CM | POA: Diagnosis not present

## 2019-03-04 MED ORDER — ONDANSETRON 8 MG PO TBDP: 8.0000 mg | ORAL_TABLET | Freq: Three times a day (TID) | ORAL | 3 refills | Status: DC | PRN

## 2019-03-04 NOTE — Progress Notes (Signed)
I connected with  Stacey [name] on 03/04/19 at 11:15 AM EDT by telephone and verified that I am speaking with the correct person using two identifiers.   I discussed the limitations, risks, security and privacy concerns of performing an evaluation and management service by telephone and the availability of in person appointments. I also discussed with the patient that there may be a patient responsible charge related to this service. The patient expressed understanding and agreed to proceed.  [place], CMA 03/04/2019  11:13 AM

## 2019-03-04 NOTE — Progress Notes (Signed)
Patient ID: Stacey [name], female   DOB: 1986-04-02, 33 y.o.   MRN: [id number]     TELEHEALTH VIRTUAL OBSTETRICS VISIT ENCOUNTER NOTE  I connected with [name] on 03/04/19 at 11:15 AM EDT by telephone at home and verified that I am speaking with the correct person using two identifiers.   I discussed the limitations, risks, security and privacy concerns of performing an evaluation and management service by telephone and the availability of in person appointments. I also discussed with the patient that there may be a patient responsible charge related to this service. The patient expressed understanding and agreed to proceed.  Subjective:  Stacey [name] is a 33 y.o. G3P1011 at [redacted]w[redacted]d being followed for ongoing prenatal care.  She is currently monitored for the following issues for this low-risk pregnancy and has Sickle cell trait ([place]); Alpha thalassemia trait; Postpartum care and examination; Hidradenitis; and Supervision of low-risk pregnancy on their problem list.  Patient reports no complaints.She is still vomiting. She would like a refill on her zofran. She is mildly constipated by it but is managing it with lots of water. Reports fetal movement. Denies any contractions, bleeding or leaking of fluid.   The following portions of the patient's history were reviewed and updated as appropriate: allergies, current medications, past family history, past medical history, past social history, past surgical history and problem list.   Objective:   General:  Alert, oriented and cooperative.   Mental Status: Normal mood and affect perceived. Normal judgment and thought content.  Rest of physical exam deferred due to type of encounter  Assessment and Plan:  Pregnancy: G3P1011 at [redacted]w[redacted]d 1. Encounter for supervision of low-risk pregnancy in second trimester -Reviewed NIPS results, which were in the files to be scanned. Normal Female.  -Patient would like Post placental IUD -Keep scheduled anatomy  scan -patient is comfortable taking Bps and putting them in baby RX. Reviewed high bp values and what to do (rest, come to MAU if not improved) -AFP to be collected at [place] visit   Preterm labor symptoms and general obstetric precautions including but not limited to vaginal bleeding, contractions, leaking of fluid and fetal movement were reviewed in detail with the patient.  I discussed the assessment and treatment plan with the patient. The patient was provided an opportunity to ask questions and all were answered. The patient agreed with the plan and demonstrated an understanding of the instructions. The patient was advised to call back or seek an in-person office evaluation/go to MAU at [facility] for any urgent or concerning symptoms. Please refer to After Visit Summary for other counseling recommendations.   I provided 11 minutes of non-face-to-face time during this encounter.  Return in about 4 weeks (around 04/01/2019), or [place] visit.  Future Appointments  Date Time Provider [place]  03/14/2019 10:15 AM [place], [place] for [employer], [place]

## 2019-03-06 ENCOUNTER — Encounter: Payer: Self-pay | Admitting: *Deleted

## 2019-03-14 ENCOUNTER — Other Ambulatory Visit: Payer: Self-pay

## 2019-03-14 ENCOUNTER — Ambulatory Visit (HOSPITAL_COMMUNITY)
Admission: RE | Admit: 2019-03-14 | Discharge: 2019-03-14 | Disposition: A | Discharge status: Home / Self Care | Payer: Medicaid Other | Source: Ambulatory Visit | Attending: Obstetrics and Gynecology | Admitting: Obstetrics and Gynecology

## 2019-03-14 ENCOUNTER — Other Ambulatory Visit (HOSPITAL_COMMUNITY): Payer: Self-pay | Admitting: *Deleted

## 2019-03-14 DIAGNOSIS — Z862 Personal history of diseases of the blood and blood-forming organs and certain disorders involving the immune mechanism: Secondary | ICD-10-CM | POA: Diagnosis not present

## 2019-03-14 DIAGNOSIS — Z3A18 18 weeks gestation of pregnancy: Secondary | ICD-10-CM

## 2019-03-14 DIAGNOSIS — Z3490 Encounter for supervision of normal pregnancy, unspecified, unspecified trimester: Secondary | ICD-10-CM | POA: Insufficient documentation

## 2019-03-14 DIAGNOSIS — Z363 Encounter for antenatal screening for malformations: Secondary | ICD-10-CM

## 2019-03-14 DIAGNOSIS — Z148 Genetic carrier of other disease: Secondary | ICD-10-CM | POA: Diagnosis not present

## 2019-03-14 DIAGNOSIS — Z362 Encounter for other antenatal screening follow-up: Secondary | ICD-10-CM

## 2019-03-14 NOTE — Progress Notes (Signed)
Thank you! I realize that the future order is an option-once I realized that you can put in the weight for today and not the future weight, and I have started doing that now. Good idea to put in the check out note!  Elisha Headland

## 2019-04-01 ENCOUNTER — Telehealth: Payer: Self-pay | Admitting: Medical

## 2019-04-01 ENCOUNTER — Encounter: Payer: Self-pay | Admitting: Medical

## 2019-04-01 ENCOUNTER — Telehealth (INDEPENDENT_AMBULATORY_CARE_PROVIDER_SITE_OTHER): Payer: Medicaid Other | Admitting: Medical

## 2019-04-01 ENCOUNTER — Other Ambulatory Visit: Payer: Self-pay

## 2019-04-01 VITALS — BP 119/69 | HR 98

## 2019-04-01 DIAGNOSIS — O219 Vomiting of pregnancy, unspecified: Secondary | ICD-10-CM

## 2019-04-01 DIAGNOSIS — Z3A21 21 weeks gestation of pregnancy: Secondary | ICD-10-CM

## 2019-04-01 DIAGNOSIS — Z3492 Encounter for supervision of normal pregnancy, unspecified, second trimester: Secondary | ICD-10-CM

## 2019-04-01 MED ORDER — DOXYLAMINE SUCCINATE (SLEEP) 25 MG PO TABS: 25.0000 mg | ORAL_TABLET | Freq: Every evening | ORAL | 0 refills | Status: DC | PRN

## 2019-04-01 MED ORDER — ONDANSETRON 8 MG PO TBDP: 8.0000 mg | ORAL_TABLET | Freq: Three times a day (TID) | ORAL | 3 refills | Status: DC | PRN

## 2019-04-01 NOTE — Patient Instructions (Signed)

## 2019-04-01 NOTE — Progress Notes (Signed)
I connected with  Stacey [name] on 04/01/19 at 10:55 AM EST by telephone and verified that I am speaking with the correct person using two identifiers.   I discussed the limitations, risks, security and privacy concerns of performing an evaluation and management service by telephone and the availability of in person appointments. I also discussed with the patient that there may be a patient responsible charge related to this service. The patient expressed understanding and agreed to proceed.  [name], CMA 04/01/2019  10:45 AM

## 2019-04-01 NOTE — Progress Notes (Signed)
I connected with Stacey [name]  on 04/01/19 at 10:55 AM EST by: MyChart and verified that I am speaking with the correct person using two identifiers.  Patient is located at home and provider is located at [facility].     The purpose of this virtual visit is to provide medical care while limiting exposure to the novel coronavirus. I discussed the limitations, risks, security and privacy concerns of performing an evaluation and management service by MyChart and the availability of in person appointments. I also discussed with the patient that there may be a patient responsible charge related to this service. By engaging in this virtual visit, you consent to the provision of healthcare.  Additionally, you authorize for your insurance to be billed for the services provided during this visit.  The patient expressed understanding and agreed to proceed.  The following staff members participated in the virtual visit:  Carver Fila, [place] VISIT NOTE  Subjective:  Stacey [name] is a 33 y.o. G3P1011 at [redacted]w[redacted]d  for phone visit for ongoing prenatal care.  She is currently monitored for the following issues for this low-risk pregnancy and has Sickle cell trait ([place]); Alpha thalassemia trait; Postpartum care and examination; Hidradenitis; and Supervision of low-risk pregnancy on their problem list.  Patient reports NV.  Contractions: Not present. Vag. Bleeding: None.  Movement: Present. Denies leaking of fluid.   The following portions of the patient's history were reviewed and updated as appropriate: allergies, current medications, past family history, past medical history, past social history, past surgical history and problem list.   Objective:   Vitals:   04/01/19 1047  BP: 119/69  Pulse: 98   Self-Obtained  Fetal Status:     Movement: Present     Assessment and Plan:  Pregnancy: G3P1011 at [redacted]w[redacted]d 1. Encounter for supervision of low-risk pregnancy in second trimester - Doing well - Notes  intermittent pelvic pressure - discussed abdominal binder  - Follow-up US for anatomy scheduled 11/17, will have AFP at that time   2. Nausea/vomiting in pregnancy - Patient feels with medications her symptoms are manageable - doxylamine, Sleep, (UNISOM) 25 MG tablet; Take 1 tablet (25 mg total) by mouth at bedtime as needed.  Dispense: 30 tablet; Refill: 0 - ondansetron (ZOFRAN ODT) 8 MG disintegrating tablet; Take 1 tablet (8 mg total) by mouth every 8 (eight) hours as needed for nausea or vomiting.  Dispense: 40 tablet; Refill: 3  Preterm labor symptoms and general obstetric precautions including but not limited to vaginal bleeding, contractions, leaking of fluid and fetal movement were reviewed in detail with the patient.  Return in about 4 weeks (around 04/29/2019) for LOB, Virtual.  Future Appointments  Date Time Provider [place]  04/15/2019  2:15 PM [facility] [place] 4 [facility]-M[facility]US M[facility]-US     Time spent on virtual visit: 10 minutes  Kerry Hough, PA-C

## 2019-04-01 NOTE — Telephone Encounter (Signed)
Attempted to contact patient to get her scheduled for an appointment for her rash. No answer, left voicemail for patient to give the office a call back to be scheduled

## 2019-04-07 ENCOUNTER — Other Ambulatory Visit: Payer: Self-pay

## 2019-04-07 DIAGNOSIS — Z20822 Contact with and (suspected) exposure to covid-19: Secondary | ICD-10-CM

## 2019-04-08 LAB — NOVEL CORONAVIRUS, NAA: SARS-CoV-2, NAA: NOT DETECTED

## 2019-04-15 ENCOUNTER — Other Ambulatory Visit: Payer: Self-pay

## 2019-04-15 ENCOUNTER — Telehealth: Payer: Self-pay | Admitting: Student

## 2019-04-15 ENCOUNTER — Ambulatory Visit (HOSPITAL_COMMUNITY)
Admission: RE | Admit: 2019-04-15 | Discharge: 2019-04-15 | Disposition: A | Discharge status: Home / Self Care | Payer: Medicaid Other | Source: Ambulatory Visit | Attending: Obstetrics and Gynecology | Admitting: Obstetrics and Gynecology

## 2019-04-15 DIAGNOSIS — Z148 Genetic carrier of other disease: Secondary | ICD-10-CM | POA: Insufficient documentation

## 2019-04-15 DIAGNOSIS — Z3A23 23 weeks gestation of pregnancy: Secondary | ICD-10-CM | POA: Diagnosis not present

## 2019-04-15 DIAGNOSIS — Z362 Encounter for other antenatal screening follow-up: Secondary | ICD-10-CM | POA: Insufficient documentation

## 2019-04-15 NOTE — Telephone Encounter (Signed)
Spoke to patient about her appointment on 11/18 @ 11:15. Patient instructed to wear a face mask for the entire appointment and no visitors are allowed with her during the visit. Patient screened for covid symptoms and denied having any

## 2019-04-16 ENCOUNTER — Ambulatory Visit (INDEPENDENT_AMBULATORY_CARE_PROVIDER_SITE_OTHER): Payer: Medicaid Other | Admitting: Student

## 2019-04-16 VITALS — BP 117/70 | HR 116 | Wt 196.5 lb

## 2019-04-16 DIAGNOSIS — Z3A23 23 weeks gestation of pregnancy: Secondary | ICD-10-CM

## 2019-04-16 DIAGNOSIS — L309 Dermatitis, unspecified: Secondary | ICD-10-CM

## 2019-04-16 DIAGNOSIS — L308 Other specified dermatitis: Secondary | ICD-10-CM

## 2019-04-16 DIAGNOSIS — Z3492 Encounter for supervision of normal pregnancy, unspecified, second trimester: Secondary | ICD-10-CM

## 2019-04-16 HISTORY — DX: Dermatitis, unspecified: L30.9

## 2019-04-16 MED ORDER — CLOBETASOL PROPIONATE 0.05 % EX OINT: TOPICAL_OINTMENT | Freq: Two times a day (BID) | CUTANEOUS | Status: DC

## 2019-04-16 NOTE — Progress Notes (Addendum)
   PRENATAL VISIT NOTE  Subjective:  Stacey [name] is a 33 y.o. G3P1011 at [redacted]w[redacted]d being seen today for ongoing prenatal care.  She is currently monitored for the following issues for this low-risk pregnancy and has Sickle cell trait ([place]); Alpha thalassemia trait; Postpartum care and examination; Hidradenitis; Supervision of low-risk pregnancy; and Eczema on their problem list.  Patient reports no complaints and scaling and itching on her back. She reports that it started two months ago. It is relieved by putting Lubriderm. .  Contractions: Not present. Vag. Bleeding: None.  Movement: Present. Denies leaking of fluid.   The following portions of the patient's history were reviewed and updated as appropriate: allergies, current medications, past family history, past medical history, past social history, past surgical history and problem list.   Objective:   Vitals:   04/16/19 1126  BP: 117/70  Pulse: (!) 116  Weight: 196 lb 8 oz (89.1 kg)    Fetal Status: Fetal Heart Rate (bpm): 155 Fundal Height: 28 cm Movement: Present     General:  Alert, oriented and cooperative. Patient is in no acute distress.  Skin: Skin is warm and dry. No rash noted.   Cardiovascular: Normal heart rate noted  Respiratory: Normal respiratory effort, no problems with respiration noted  Abdomen: Soft, gravid, appropriate for gestational age.  Pain/Pressure: Present     Pelvic: Cervical exam deferred        Extremities: Normal range of motion.  Edema: None  Mental Status: Normal mood and affect. Normal behavior. Normal judgment and thought content.   Assessment and Plan:  Pregnancy: G3P1011 at [redacted]w[redacted]d 1. Encounter for supervision of low-risk pregnancy in second trimester -Dr. Elly Modena consulted about scaling on back; will start steriod cream and aquaphor ointment on top of steroid cream twice a day.  - AFP, Serum, Open Spina Bifida -Detailed instructions about 2 hour GTT given for next visit.   Preterm labor  symptoms and general obstetric precautions including but not limited to vaginal bleeding, contractions, leaking of fluid and fetal movement were reviewed in detail with the patient. Please refer to After Visit Summary for other counseling recommendations.   Return LROB and GTT in person.  Future Appointments  Date Time Provider [place]  04/29/2019 10:55 AM [place], CNM [facility] [place]    Mervyn Skeeters [place], [place]

## 2019-04-16 NOTE — Patient Instructions (Signed)
Glucose Tolerance Test During Pregnancy Why am I having this test? The glucose tolerance test (GTT) is done to check how your body processes sugar (glucose). This is one of several tests used to diagnose diabetes that develops during pregnancy (gestational diabetes mellitus). Gestational diabetes is a temporary form of diabetes that some women develop during pregnancy. It usually occurs during the second trimester of pregnancy and goes away after delivery. Testing (screening) for gestational diabetes usually occurs between 24 and 28 weeks of pregnancy. You may have the GTT test after having a 1-hour glucose screening test if the results from that test indicate that you may have gestational diabetes. You may also have this test if:  You have a history of gestational diabetes.  You have a history of giving birth to very large babies or have experienced repeated fetal loss (stillbirth).  You have signs and symptoms of diabetes, such as: ? Changes in your vision. ? Tingling or numbness in your hands or feet. ? Changes in hunger, thirst, and urination that are not otherwise explained by your pregnancy. What is being tested? This test measures the amount of glucose in your blood at different times during a period of 3 hours. This indicates how well your body is able to process glucose. What kind of sample is taken?  Blood samples are required for this test. They are usually collected by inserting a needle into a blood vessel. How do I prepare for this test?  For 3 days before your test, eat normally. Have plenty of carbohydrate-rich foods.  Follow instructions from your health care provider about: ? Eating or drinking restrictions on the day of the test. You may be asked to not eat or drink anything other than water (fast) starting 8-10 hours before the test. ? Changing or stopping your regular medicines. Some medicines may interfere with this test. Tell a health care provider about:  All  medicines you are taking, including vitamins, herbs, eye drops, creams, and over-the-counter medicines.  Any blood disorders you have.  Any surgeries you have had.  Any medical conditions you have. What happens during the test? First, your blood glucose will be measured. This is referred to as your fasting blood glucose, since you fasted before the test. Then, you will drink a glucose solution that contains a certain amount of glucose. Your blood glucose will be measured again 1, 2, and 3 hours after drinking the solution. This test takes about 3 hours to complete. You will need to stay at the testing location during this time. During the testing period:  Do not eat or drink anything other than the glucose solution.  Do not exercise.  Do not use any products that contain nicotine or tobacco, such as cigarettes and e-cigarettes. If you need help stopping, ask your health care provider. The testing procedure may vary among health care providers and hospitals. How are the results reported? Your results will be reported as milligrams of glucose per deciliter of blood (mg/dL) or millimoles per liter (mmol/L). Your health care provider will compare your results to normal ranges that were established after testing a large group of people (reference ranges). Reference ranges may vary among labs and hospitals. For this test, common reference ranges are:  Fasting: less than 95-105 mg/dL (5.3-5.8 mmol/L).  1 hour after drinking glucose: less than 180-190 mg/dL (10.0-10.5 mmol/L).  2 hours after drinking glucose: less than 155-165 mg/dL (8.6-9.2 mmol/L).  3 hours after drinking glucose: 140-145 mg/dL (7.8-8.1 mmol/L). What do the   results mean? Results within reference ranges are considered normal, meaning that your glucose levels are well-controlled. If two or more of your blood glucose levels are high, you may be diagnosed with gestational diabetes. If only one level is high, your health care  provider may suggest repeat testing or other tests to confirm a diagnosis. Talk with your health care provider about what your results mean. Questions to ask your health care provider Ask your health care provider, or the department that is doing the test:  When will my results be ready?  How will I get my results?  What are my treatment options?  What other tests do I need?  What are my next steps? Summary  The glucose tolerance test (GTT) is one of several tests used to diagnose diabetes that develops during pregnancy (gestational diabetes mellitus). Gestational diabetes is a temporary form of diabetes that some women develop during pregnancy.  You may have the GTT test after having a 1-hour glucose screening test if the results from that test indicate that you may have gestational diabetes. You may also have this test if you have any symptoms or risk factors for gestational diabetes.  Talk with your health care provider about what your results mean. This information is not intended to replace advice given to you by your health care provider. Make sure you discuss any questions you have with your health care provider. Document Released: 11/14/2011 Document Revised: 09/05/2018 Document Reviewed: 12/25/2016 Elsevier Patient Education  2020 Elsevier Inc.  

## 2019-04-18 LAB — AFP, SERUM, OPEN SPINA BIFIDA
AFP MoM: 1.92
AFP Value: 156.7 ng/mL
Gest. Age on Collection Date: 23.2 weeks
Maternal Age At EDD: 33.5 yr
OSBR Risk 1 IN: 1930
Test Results:: NEGATIVE
Weight: 196 [lb_av]

## 2019-04-22 MED ORDER — CLOBETASOL PROPIONATE 0.05 % EX OINT: 1.0000 "application " | TOPICAL_OINTMENT | Freq: Two times a day (BID) | CUTANEOUS | 0 refills | Status: DC

## 2019-04-29 ENCOUNTER — Encounter: Payer: Medicaid Other | Admitting: Advanced Practice Midwife

## 2019-05-12 ENCOUNTER — Other Ambulatory Visit: Payer: Self-pay | Admitting: *Deleted

## 2019-05-12 DIAGNOSIS — Z3490 Encounter for supervision of normal pregnancy, unspecified, unspecified trimester: Secondary | ICD-10-CM

## 2019-05-13 ENCOUNTER — Other Ambulatory Visit: Payer: Medicaid Other

## 2019-05-13 ENCOUNTER — Ambulatory Visit (INDEPENDENT_AMBULATORY_CARE_PROVIDER_SITE_OTHER): Payer: Medicaid Other | Admitting: Student

## 2019-05-13 ENCOUNTER — Other Ambulatory Visit: Payer: Self-pay

## 2019-05-13 VITALS — BP 115/59 | HR 112 | Wt 198.0 lb

## 2019-05-13 DIAGNOSIS — Z3A27 27 weeks gestation of pregnancy: Secondary | ICD-10-CM

## 2019-05-13 DIAGNOSIS — Z3493 Encounter for supervision of normal pregnancy, unspecified, third trimester: Secondary | ICD-10-CM

## 2019-05-13 DIAGNOSIS — Z3492 Encounter for supervision of normal pregnancy, unspecified, second trimester: Secondary | ICD-10-CM | POA: Diagnosis present

## 2019-05-13 DIAGNOSIS — Z3490 Encounter for supervision of normal pregnancy, unspecified, unspecified trimester: Secondary | ICD-10-CM

## 2019-05-13 DIAGNOSIS — Z23 Encounter for immunization: Secondary | ICD-10-CM

## 2019-05-13 MED ORDER — CLOBETASOL PROPIONATE 0.05 % EX OINT: 1.0000 "application " | TOPICAL_OINTMENT | Freq: Two times a day (BID) | CUTANEOUS | 1 refills | Status: DC

## 2019-05-13 NOTE — Progress Notes (Signed)
   PRENATAL VISIT NOTE  Subjective:  Stacey [name] is a 33 y.o. G3P1011 at [redacted]w[redacted]d being seen today for ongoing prenatal care.  She is currently monitored for the following issues for this low-risk pregnancy and has Sickle cell trait (Reading); Alpha thalassemia trait; Postpartum care and examination; Hidradenitis; Supervision of low-risk pregnancy; and Eczema on their problem list.  Patient reports no complaints.  Contractions: Not present. Vag. Bleeding: None.  Movement: Present. Denies leaking of fluid.   The following portions of the patient's history were reviewed and updated as appropriate: allergies, current medications, past family history, past medical history, past social history, past surgical history and problem list.   Objective:   Vitals:   05/13/19 1017  BP: (!) 115/59  Pulse: (!) 112  Weight: 198 lb (89.8 kg)    Fetal Status: Fetal Heart Rate (bpm): 140 Fundal Height: 29 cm Movement: Present     General:  Alert, oriented and cooperative. Patient is in no acute distress.  Skin: Skin is warm and dry. No rash noted.   Cardiovascular: Normal heart rate noted  Respiratory: Normal respiratory effort, no problems with respiration noted  Abdomen: Soft, gravid, appropriate for gestational age.  Pain/Pressure: Present     Pelvic: Cervical exam deferred        Extremities: Normal range of motion.  Edema: None  Mental Status: Normal mood and affect. Normal behavior. Normal judgment and thought content.   Assessment and Plan:  Pregnancy: G3P1011 at [redacted]w[redacted]d 1. Encounter for supervision of low-risk pregnancy in third trimester -Patient doing well; ezcema improving; refill given on cream -is checking BP, all normal.  - Tdap vaccine greater than or equal to 7yo IM -2 hour GTT in process   Preterm labor symptoms and general obstetric precautions including but not limited to vaginal bleeding, contractions, leaking of fluid and fetal movement were reviewed in detail with the  patient. Please refer to After Visit Summary for other counseling recommendations.   Return in about 4 weeks (around 06/10/2019), or LROB on my chart.  No future appointments.  Starr Lake, CNM

## 2019-05-14 ENCOUNTER — Other Ambulatory Visit: Payer: Medicaid Other

## 2019-05-14 LAB — CBC
Hematocrit: 32.7 % — ABNORMAL LOW (ref 34.0–46.6)
Hemoglobin: 10.9 g/dL — ABNORMAL LOW (ref 11.1–15.9)
MCH: 25.3 pg — ABNORMAL LOW (ref 26.6–33.0)
MCHC: 33.3 g/dL (ref 31.5–35.7)
MCV: 76 fL — ABNORMAL LOW (ref 79–97)
Platelets: 229 10*3/uL (ref 150–450)
RBC: 4.3 x10E6/uL (ref 3.77–5.28)
RDW: 14.4 % (ref 11.7–15.4)
WBC: 5.9 10*3/uL (ref 3.4–10.8)

## 2019-05-14 LAB — HIV ANTIBODY (ROUTINE TESTING W REFLEX): HIV Screen 4th Generation wRfx: NONREACTIVE

## 2019-05-14 LAB — GLUCOSE TOLERANCE, 2 HOURS W/ 1HR
Glucose, 1 hour: 138 mg/dL (ref 65–179)
Glucose, 2 hour: 104 mg/dL (ref 65–152)
Glucose, Fasting: 95 mg/dL — ABNORMAL HIGH (ref 65–91)

## 2019-05-14 LAB — RPR: RPR Ser Ql: NONREACTIVE

## 2019-05-15 ENCOUNTER — Telehealth: Payer: Self-pay | Admitting: *Deleted

## 2019-05-15 ENCOUNTER — Encounter: Payer: Self-pay | Admitting: Student

## 2019-05-15 DIAGNOSIS — O24419 Gestational diabetes mellitus in pregnancy, unspecified control: Secondary | ICD-10-CM | POA: Insufficient documentation

## 2019-05-15 DIAGNOSIS — O0990 Supervision of high risk pregnancy, unspecified, unspecified trimester: Secondary | ICD-10-CM

## 2019-05-15 MED ORDER — ACCU-CHEK GUIDE VI STRP: ORAL_STRIP | 12 refills | Status: DC

## 2019-05-15 MED ORDER — ACCU-CHEK SOFTCLIX LANCETS MISC: 12 refills | Status: DC

## 2019-05-15 MED ORDER — ACCU-CHEK GUIDE ME W/DEVICE KIT: 1.0000 | PACK | Freq: Once | 0 refills | Status: AC

## 2019-05-15 NOTE — Telephone Encounter (Signed)
-----   Message from Starr Lake, [place] sent at 05/15/2019  9:08 AM EST ----- Hello! This patient did not pass her GDM screening; she has gestational diabetes. I'll message her to let her know, but if you can get her scheduled with Bev and get her strips and meter ordered, that would be wonderful. Thank you so much.

## 2019-05-15 NOTE — Telephone Encounter (Signed)
I called Stacey [name] as requested and informed her glucose test was high and she now has GDM. I gave her an appointment with the diabetes instructor and explained I am sending her diabetes supples to her pharmacy and she should take those with her to that appointment. I also explained we will move her next appointment to an MD because she is high risk now and I gave her that appointment . She asked for information about GDM and I informed her I will send information thru MyChart. I also informed she can go ahead and start trying to eliminate sugar from her diet such as sweets, fruit juice, etc. She voices understanding and also states pharmacy never got rx for clobetasole. I Informed her it was sent ; but I will call p harmacy to make sure they got it and to check back with them later today.  Micca Matura,RN

## 2019-05-16 NOTE — Addendum Note (Signed)
Addended by: Samuel Germany on: 05/16/2019 08:21 AM   Modules accepted: Orders

## 2019-05-28 ENCOUNTER — Other Ambulatory Visit: Payer: Self-pay

## 2019-05-28 ENCOUNTER — Encounter: Payer: Self-pay | Admitting: Registered"

## 2019-05-28 ENCOUNTER — Encounter: Payer: Medicaid Other | Attending: Student | Admitting: Registered"

## 2019-05-28 DIAGNOSIS — O99810 Abnormal glucose complicating pregnancy: Secondary | ICD-10-CM | POA: Insufficient documentation

## 2019-05-28 NOTE — Progress Notes (Signed)
Patient was seen on 05/28/2019 for Gestational Diabetes self-management class at [facility]. The following learning objectives were met by the patient during this course:   States the definition of Gestational Diabetes  States why dietary management is important in controlling blood glucose  Describes the effects each nutrient has on blood glucose levels  Demonstrates ability to create a balanced meal plan  Demonstrates carbohydrate counting   States when to check blood glucose levels  Demonstrates proper blood glucose monitoring techniques  States the effect of stress and exercise on blood glucose levels  States the importance of limiting caffeine and abstaining from alcohol and smoking  Blood glucose monitor given: none   Patient instructed to monitor glucose levels: FBS: 60 - <95; 1 hour: <140; 2 hour: <120  Patient received handouts:  Nutrition Diabetes and Pregnancy, including carb counting list  Patient will be seen for follow-up as needed.

## 2019-05-30 DIAGNOSIS — Z8632 Personal history of gestational diabetes: Secondary | ICD-10-CM

## 2019-05-30 HISTORY — DX: Personal history of gestational diabetes: Z86.32

## 2019-05-30 NOTE — L&D Delivery Note (Signed)
[facility] Delivery Note  Stacey [name] is a 34 y.o. G3P1011 s/p NSVD at [redacted]w[redacted]d. She was admitted for active labor.   ROM: 0h 79m with clear fluid GBS Status: positive, inadequately treated prior to delivery Maximum Maternal Temperature: 98.7*F  Labor Progress: . She was admitted in active labor, SROMed, and progressed to complete without any augmentation  Delivery Date/Time: 08/01/2019, 0111 Delivery: Called to room and patient was complete and pushing. Head delivered LOA. No nuchal cord present. Shoulder and body delivered in usual fashion. Infant with spontaneous cry, placed on mother's abdomen, dried and stimulated. Cord clamped x 2 after 1-minute delay, and cut by FOB under my direct supervision. Cord blood drawn. Placenta delivered spontaneously with gentle cord traction. Fundus firm with massage and Pitocin. Labia, perineum, vagina, and cervix were inspected, and a left peri-urethral tear was noted- hemostatic, not requiring repair.   Placenta: 3 vessel cord, intact, meconium-stained, to labor and delivery Complications: None Lacerations: left peri-urethral- hemostatic, not requiring repair EBL: 87 mL Analgesia: None  Postpartum Planning [x]  message to sent to schedule follow-up  [x]  vaccines UTD  Infant: female  APGARs 9, 9  weight per medical record  , DO OB/GYN Fellow, Faculty Practice

## 2019-06-02 ENCOUNTER — Telehealth: Payer: Self-pay | Admitting: Registered"

## 2019-06-02 NOTE — Telephone Encounter (Signed)
Patient has been using [employer] for blood pressure. Informed patient that glucose tracking will be added to her Baby Scripts and we will discuss tomorrow in her nutrition appointment.

## 2019-06-03 ENCOUNTER — Other Ambulatory Visit: Payer: Self-pay

## 2019-06-03 ENCOUNTER — Encounter: Payer: Medicaid Other | Attending: Student | Admitting: *Deleted

## 2019-06-03 ENCOUNTER — Ambulatory Visit: Payer: Medicaid Other | Admitting: *Deleted

## 2019-06-03 DIAGNOSIS — O99810 Abnormal glucose complicating pregnancy: Secondary | ICD-10-CM | POA: Diagnosis present

## 2019-06-03 NOTE — Progress Notes (Signed)
Chart reviewed for nurse visit. Agree with plan of care.   Sherod Cisse Lorraine, CNM 06/03/2019 7:26 PM   

## 2019-06-03 NOTE — Progress Notes (Signed)
Patient was seen on 06/03/19 for follow-up Gestational Diabetes self-management. EDD 08/11/18. Patient states no history of GDM. Diet history obtained. Patient eats good variety of all food groups. Beverages include water. Patient relies mostly on fish for protein. Discussed other sources of protein. 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  Plan:  Consider including protein with breakfast Consider including more variety of protein and reducing fish intake  Baby Scripts:  Patient was introduced to [employer] and plans to use as record of BG electronically  Take medication if directed by MD  Patient already has a meter: Accu-chek And is testing pre breakfast and 2 hours each meal as directed by MD Patient reported PPBG are within range. FBG occasionally is between 95-100  Patient instructed to monitor glucose levels: FBS: 60 - 95 mg/dl 2 hour: <258 mg/dl  Patient received the following handouts:  No handouts today  Patient will be seen for follow-up in as needed.

## 2019-06-06 ENCOUNTER — Encounter: Payer: Self-pay | Admitting: Family Medicine

## 2019-06-06 ENCOUNTER — Other Ambulatory Visit (HOSPITAL_COMMUNITY): Payer: Self-pay | Admitting: Student

## 2019-06-06 DIAGNOSIS — O219 Vomiting of pregnancy, unspecified: Secondary | ICD-10-CM

## 2019-06-06 MED ORDER — ONDANSETRON 8 MG PO TBDP: 8.0000 mg | ORAL_TABLET | Freq: Three times a day (TID) | ORAL | 3 refills | Status: DC | PRN

## 2019-06-09 ENCOUNTER — Other Ambulatory Visit: Payer: Self-pay

## 2019-06-09 ENCOUNTER — Telehealth (INDEPENDENT_AMBULATORY_CARE_PROVIDER_SITE_OTHER): Payer: Medicaid Other | Admitting: Obstetrics and Gynecology

## 2019-06-09 DIAGNOSIS — O0990 Supervision of high risk pregnancy, unspecified, unspecified trimester: Secondary | ICD-10-CM

## 2019-06-09 NOTE — Progress Notes (Signed)
Pt did not answer call x2 by CMA. She will be rescheduled.   Baldemar Lenis, M.D. Attending [facility])

## 2019-06-09 NOTE — Progress Notes (Signed)
8:44a- Called pt, no answer,left VM that will call back in 10 to 15 minutes.   8:52a- 2nd attempt. Still no answer, pt will need to be rescheduled.

## 2019-06-10 ENCOUNTER — Telehealth: Payer: Medicaid Other | Admitting: Student

## 2019-06-10 ENCOUNTER — Telehealth: Payer: Medicaid Other | Admitting: Obstetrics and Gynecology

## 2019-06-12 ENCOUNTER — Encounter: Payer: Self-pay | Admitting: Obstetrics and Gynecology

## 2019-06-12 ENCOUNTER — Other Ambulatory Visit: Payer: Self-pay

## 2019-06-12 ENCOUNTER — Ambulatory Visit (INDEPENDENT_AMBULATORY_CARE_PROVIDER_SITE_OTHER): Payer: Medicaid Other | Admitting: Obstetrics and Gynecology

## 2019-06-12 VITALS — BP 126/80 | HR 120 | Wt 199.0 lb

## 2019-06-12 DIAGNOSIS — Z3A31 31 weeks gestation of pregnancy: Secondary | ICD-10-CM

## 2019-06-12 DIAGNOSIS — O0990 Supervision of high risk pregnancy, unspecified, unspecified trimester: Secondary | ICD-10-CM

## 2019-06-12 DIAGNOSIS — O0993 Supervision of high risk pregnancy, unspecified, third trimester: Secondary | ICD-10-CM

## 2019-06-12 DIAGNOSIS — O24410 Gestational diabetes mellitus in pregnancy, diet controlled: Secondary | ICD-10-CM

## 2019-06-12 NOTE — Progress Notes (Signed)
   PRENATAL VISIT NOTE  Subjective:  Stacey [name] is a 34 y.o. G3P1011 at [redacted]w[redacted]d being seen today for ongoing prenatal care.  She is currently monitored for the following issues for this high-risk pregnancy and has Sickle cell trait (HCC); Alpha thalassemia trait; Postpartum care and examination; Hidradenitis; Supervision of high risk pregnancy, antepartum; Eczema; and Gestational diabetes on their problem list.  Patient reports no complaints.  Contractions: Not present. Vag. Bleeding: None.  Movement: Present. Denies leaking of fluid.   The following portions of the patient's history were reviewed and updated as appropriate: allergies, current medications, past family history, past medical history, past social history, past surgical history and problem list.   Objective:   Vitals:   06/12/19 0938  BP: 126/80  Pulse: (!) 120  Weight: 199 lb (90.3 kg)    Fetal Status: Fetal Heart Rate (bpm): 145 Fundal Height: 31 cm Movement: Present     General:  Alert, oriented and cooperative. Patient is in no acute distress.  Skin: Skin is warm and dry. No rash noted.   Cardiovascular: Normal heart rate noted  Respiratory: Normal respiratory effort, no problems with respiration noted  Abdomen: Soft, gravid, appropriate for gestational age.  Pain/Pressure: Present     Pelvic: Cervical exam deferred        Extremities: Normal range of motion.  Edema: None  Mental Status: Normal mood and affect. Normal behavior. Normal judgment and thought content.   Assessment and Plan:  Pregnancy: G3P1011 at [redacted]w[redacted]d 1. Supervision of high risk pregnancy, antepartum Patient is doing well without complaints  2. Diet controlled gestational diabetes mellitus (GDM) in third trimester CBgs reviewed and all elevated fasting. 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 Patient is  aware that if fasting remains elevated we will need to start medication management  Preterm labor symptoms and general obstetric precautions including but not limited to vaginal bleeding, contractions, leaking of fluid and fetal movement were reviewed in detail with the patient. Please refer to After Visit Summary for other counseling recommendations.   Return in about 2 weeks (around 06/26/2019) for Virtual, ROB, High risk.  Future Appointments  Date Time Provider Department Center  06/26/2019  9:55 AM Sivan Cuello, Gigi Gin, MD [facility]    Catalina Antigua, MD

## 2019-06-18 ENCOUNTER — Telehealth: Payer: Self-pay | Admitting: *Deleted

## 2019-06-18 DIAGNOSIS — O0990 Supervision of high risk pregnancy, unspecified, unspecified trimester: Secondary | ICD-10-CM

## 2019-06-18 DIAGNOSIS — O24419 Gestational diabetes mellitus in pregnancy, unspecified control: Secondary | ICD-10-CM

## 2019-06-18 MED ORDER — ACCU-CHEK GUIDE VI STRP: ORAL_STRIP | 12 refills | Status: DC

## 2019-06-18 MED ORDER — METFORMIN HCL 500 MG PO TABS: 500.0000 mg | ORAL_TABLET | Freq: Every day | ORAL | 5 refills | Status: DC

## 2019-06-18 NOTE — Telephone Encounter (Signed)
       Lesean Woolverton,RN

## 2019-06-18 NOTE — Telephone Encounter (Signed)
I called Stacey [name] and informed her per Dr. Jolayne Panther that we got an alert re: her cbg's that they were a little high and Dr.Constant wants her to start metformin - 1 tablet with dinner. I instructed her to continue checking her blood sugar and that at her next ob visit next week the doctor will review her blood sugars and discuss with her. I explained they will continue to monitor her blood sugars throughout her pregnancy and make changes depending on her blood sugar results. She voices understanding. She also reports [facility] of test strips until [facility]/29/19 and said they would contact her doctor. I informed her  I do not see that but that we can sent the prescription to another pharmacy and she chose another pharmacy, rx sent. I asked her to let us know if she has any trouble getting her strips as it is important she continue to check her blood sugar. She voices understanding.  Linda,RN

## 2019-06-18 NOTE — Telephone Encounter (Signed)
Received a phone call message from [employer] that [place] had some abnormal glucose readings. Per review  wnl but has not logged in today's yet. Marland Kitchen She was seen by Diet educator for GDM education 06/03/19 and then saw provider Earlene Plater) 1/ 14/21. Next ob visit 06/26/19 (Constant).  Will forward this message to Dr.Constant.    Linda,RN

## 2019-06-20 ENCOUNTER — Encounter: Payer: Self-pay | Admitting: Obstetrics and Gynecology

## 2019-06-20 NOTE — Progress Notes (Signed)
Reviewed data from [employer], patient has been logging CBGs 4x per day. Has elevated CBGs. Per provider note, she was started on metformin 2 days ago. Has f/u scheduled for next week.   Baldemar Lenis, M.D. Attending [facility])

## 2019-06-26 ENCOUNTER — Telehealth (INDEPENDENT_AMBULATORY_CARE_PROVIDER_SITE_OTHER): Payer: Medicaid Other | Admitting: Obstetrics and Gynecology

## 2019-06-26 ENCOUNTER — Encounter: Payer: Self-pay | Admitting: Obstetrics and Gynecology

## 2019-06-26 VITALS — BP 120/72 | HR 105

## 2019-06-26 DIAGNOSIS — O0993 Supervision of high risk pregnancy, unspecified, third trimester: Secondary | ICD-10-CM

## 2019-06-26 DIAGNOSIS — O0990 Supervision of high risk pregnancy, unspecified, unspecified trimester: Secondary | ICD-10-CM

## 2019-06-26 DIAGNOSIS — O24415 Gestational diabetes mellitus in pregnancy, controlled by oral hypoglycemic drugs: Secondary | ICD-10-CM

## 2019-06-26 DIAGNOSIS — Z3A33 33 weeks gestation of pregnancy: Secondary | ICD-10-CM

## 2019-06-26 DIAGNOSIS — O219 Vomiting of pregnancy, unspecified: Secondary | ICD-10-CM

## 2019-06-26 MED ORDER — CLOBETASOL PROPIONATE 0.05 % EX OINT: 1.0000 "application " | TOPICAL_OINTMENT | Freq: Two times a day (BID) | CUTANEOUS | 1 refills | Status: DC

## 2019-06-26 MED ORDER — DOXYLAMINE SUCCINATE (SLEEP) 25 MG PO TABS: 25.0000 mg | ORAL_TABLET | Freq: Every evening | ORAL | 0 refills | Status: DC | PRN

## 2019-06-26 NOTE — Progress Notes (Signed)
I connected with  Stacey [name] on 06/26/19 at 0933 by telephone and verified that I am speaking with the correct person using two identifiers.   I discussed the limitations, risks, security and privacy concerns of performing an evaluation and management service by telephone and the availability of in person appointments. I also discussed with the patient that there may be a patient responsible charge related to this service. The patient expressed understanding and agreed to proceed.      Marjo Bicker, RN 06/26/2019  9:33 AM

## 2019-06-26 NOTE — Progress Notes (Signed)
   TELEHEALTH OBSTETRICS PRENATAL VIRTUAL VIDEO VISIT ENCOUNTER NOTE  Provider location: [facility] at [place]   I connected with [name] on 06/25/32 at  9:55 AM EST by MyChart Video Encounter at home and verified that I am speaking with the correct person using two identifiers.   I discussed the limitations, risks, security and privacy concerns of performing an evaluation and management service virtually and the availability of in person appointments. I also discussed with the patient that there may be a patient responsible charge related to this service. The patient expressed understanding and agreed to proceed. Subjective:  Stacey [name] is a 34 y.o. G3P1011 at [redacted]w[redacted]d being seen today for ongoing prenatal care.  She is currently monitored for the following issues for this high-risk pregnancy and has Sickle cell trait (HCC); Alpha thalassemia trait; Postpartum care and examination; Hidradenitis; Supervision of high risk pregnancy, antepartum; Eczema; and Gestational diabetes on their problem list.  Patient reports no complaints.  Contractions: Irregular. Vag. Bleeding: None.  Movement: Present. Denies any leaking of fluid.   The following portions of the patient's history were reviewed and updated as appropriate: allergies, current medications, past family history, past medical history, past social history, past surgical history and problem list.   Objective:   Vitals:   06/26/19 0934  BP: 120/72  Pulse: (!) 105    Fetal Status:     Movement: Present     General:  Alert, oriented and cooperative. Patient is in no acute distress.  Respiratory: Normal respiratory effort, no problems with respiration noted  Mental Status: Normal mood and affect. Normal behavior. Normal judgment and thought content.  Rest of physical exam deferred due to type of encounter  Imaging: No results found.  Assessment and Plan:  Pregnancy: G3P1011 at [redacted]w[redacted]d 1. Supervision of high risk  pregnancy, antepartum Patient is doing well  2. Gestational diabetes mellitus (GDM) in third trimester controlled on oral hypoglycemic drug CBGs reviewed and all within range since starting metformin Continue metformin at current dose Will start antenatal testing and schedule growth ultrasound  Preterm labor symptoms and general obstetric precautions including but not limited to vaginal bleeding, contractions, leaking of fluid and fetal movement were reviewed in detail with the patient. I discussed the assessment and treatment plan with the patient. The patient was provided an opportunity to ask questions and all were answered. The patient agreed with the plan and demonstrated an understanding of the instructions. The patient was advised to call back or seek an in-person office evaluation/go to [facility]U at [facility] for any urgent or concerning symptoms. Please refer to After Visit Summary for other counseling recommendations.   I provided 11 minutes of face-to-face time during this encounter.  Return in about 1 week (around 07/02/2032) for in person, ROB, High risk, NST/BPP.  Future Appointments  Date Time Provider Department Center  06/26/2019  9:55 AM Madsen Riddle, Gigi Gin, MD [facility]    Catalina Antigua, MD Center for [facility], [facility]

## 2019-07-02 ENCOUNTER — Other Ambulatory Visit: Payer: Self-pay

## 2019-07-02 ENCOUNTER — Ambulatory Visit: Payer: Self-pay

## 2019-07-02 ENCOUNTER — Other Ambulatory Visit: Payer: Medicaid Other

## 2019-07-02 ENCOUNTER — Ambulatory Visit (INDEPENDENT_AMBULATORY_CARE_PROVIDER_SITE_OTHER): Payer: Medicaid Other | Admitting: Obstetrics and Gynecology

## 2019-07-02 ENCOUNTER — Ambulatory Visit (INDEPENDENT_AMBULATORY_CARE_PROVIDER_SITE_OTHER): Payer: Medicaid Other | Admitting: *Deleted

## 2019-07-02 VITALS — BP 131/70 | HR 134 | Wt 197.0 lb

## 2019-07-02 DIAGNOSIS — Z3A34 34 weeks gestation of pregnancy: Secondary | ICD-10-CM | POA: Diagnosis not present

## 2019-07-02 DIAGNOSIS — O24415 Gestational diabetes mellitus in pregnancy, controlled by oral hypoglycemic drugs: Secondary | ICD-10-CM | POA: Diagnosis present

## 2019-07-02 DIAGNOSIS — O0993 Supervision of high risk pregnancy, unspecified, third trimester: Secondary | ICD-10-CM

## 2019-07-02 DIAGNOSIS — O0990 Supervision of high risk pregnancy, unspecified, unspecified trimester: Secondary | ICD-10-CM

## 2019-07-02 DIAGNOSIS — D573 Sickle-cell trait: Secondary | ICD-10-CM

## 2019-07-02 MED ORDER — METFORMIN HCL 500 MG PO TABS: 1000.0000 mg | ORAL_TABLET | Freq: Every day | ORAL | 1 refills | Status: DC

## 2019-07-02 NOTE — Progress Notes (Signed)
Pt is concerned about her increased pulse and states she has difficulty breathing sometimes.  [place] for growth scheduled on 2/9 - BPP added

## 2019-07-02 NOTE — Progress Notes (Signed)
  Prenatal Visit Note Date: 07/02/2019 Clinic: [facility]  Subjective:  Stacey [name] is a 34 y.o. G3P1011 at [redacted]w[redacted]d being seen today for ongoing prenatal care.  She is currently monitored for the following issues for this high-risk pregnancy and has Sickle cell trait (HCC); Alpha thalassemia trait; Hidradenitis; Supervision of high risk pregnancy, antepartum; Eczema; and Gestational diabetes on their problem list.  Patient reports no complaints.   Contractions: Irregular. Vag. Bleeding: None.  Movement: Present. Denies leaking of fluid.   The following portions of the patient's history were reviewed and updated as appropriate: allergies, current medications, past family history, past medical history, past social history, past surgical history and problem list. Problem list updated.  Objective:   Vitals:   07/02/19 0829  BP: 131/70  Pulse: (!) 134  Weight: 197 lb (89.4 kg)    Fetal Status: Fetal Heart Rate (bpm): NST   Movement: Present     General:  Alert, oriented and cooperative. Patient is in no acute distress.  Skin: Skin is warm and dry. No rash noted.   Cardiovascular: Normal heart rate noted  Respiratory: Normal respiratory effort, no problems with respiration noted  Abdomen: Soft, gravid, appropriate for gestational age. Pain/Pressure: Present     Pelvic:  Cervical exam deferred        Extremities: Normal range of motion.  Edema: None  Mental Status: Normal mood and affect. Normal behavior. Normal judgment and thought content.   Urinalysis:      Assessment and Plan:  Pregnancy: G3P1011 at [redacted]w[redacted]d  1. Supervision of high risk pregnancy, antepartum Routine care.  2. Gestational diabetes mellitus (GDM) in third trimester controlled on oral hypoglycemic drug Slightly elevated fastings in the AM (see baby scripts). Will increase from metformin 500 with dinner to 1000. May have to adjust it to qhs. Pt doesn't snack after dinner  bpp 10/10 today. Has  repeat growth next week  3. Sickle cell trait (HCC) Surveillance culture today - Culture, OB Urine  Preterm labor symptoms and general obstetric precautions including but not limited to vaginal bleeding, contractions, leaking of fluid and fetal movement were reviewed in detail with the patient. Please refer to After Visit Summary for other counseling recommendations.  Return in about 6 days (around 07/08/2019) for weekly NST/BPP with diane and HOB until 38 wks .   Emden Bing, MD

## 2019-07-02 NOTE — Progress Notes (Signed)

## 2019-07-04 ENCOUNTER — Ambulatory Visit (HOSPITAL_COMMUNITY): Payer: Medicaid Other

## 2019-07-07 ENCOUNTER — Encounter: Payer: Self-pay | Admitting: Obstetrics and Gynecology

## 2019-07-07 DIAGNOSIS — R8271 Bacteriuria: Secondary | ICD-10-CM | POA: Insufficient documentation

## 2019-07-07 LAB — URINE CULTURE, OB REFLEX

## 2019-07-07 LAB — CULTURE, OB URINE

## 2019-07-08 ENCOUNTER — Ambulatory Visit (HOSPITAL_COMMUNITY): Payer: Medicaid Other | Admitting: *Deleted

## 2019-07-08 ENCOUNTER — Encounter (HOSPITAL_COMMUNITY): Payer: Self-pay | Admitting: *Deleted

## 2019-07-08 ENCOUNTER — Other Ambulatory Visit: Payer: Self-pay

## 2019-07-08 ENCOUNTER — Ambulatory Visit (HOSPITAL_COMMUNITY)
Admission: RE | Admit: 2019-07-08 | Discharge: 2019-07-08 | Disposition: A | Discharge status: Home / Self Care | Payer: Medicaid Other | Source: Ambulatory Visit | Attending: Obstetrics and Gynecology | Admitting: Obstetrics and Gynecology

## 2019-07-08 DIAGNOSIS — O0990 Supervision of high risk pregnancy, unspecified, unspecified trimester: Secondary | ICD-10-CM

## 2019-07-08 DIAGNOSIS — Z362 Encounter for other antenatal screening follow-up: Secondary | ICD-10-CM | POA: Diagnosis not present

## 2019-07-08 DIAGNOSIS — O24415 Gestational diabetes mellitus in pregnancy, controlled by oral hypoglycemic drugs: Secondary | ICD-10-CM | POA: Diagnosis present

## 2019-07-08 DIAGNOSIS — Z3A35 35 weeks gestation of pregnancy: Secondary | ICD-10-CM | POA: Diagnosis not present

## 2019-07-10 ENCOUNTER — Ambulatory Visit (INDEPENDENT_AMBULATORY_CARE_PROVIDER_SITE_OTHER): Payer: Medicaid Other | Admitting: Obstetrics & Gynecology

## 2019-07-10 ENCOUNTER — Ambulatory Visit (INDEPENDENT_AMBULATORY_CARE_PROVIDER_SITE_OTHER): Payer: Medicaid Other | Admitting: *Deleted

## 2019-07-10 ENCOUNTER — Other Ambulatory Visit: Payer: Self-pay

## 2019-07-10 ENCOUNTER — Encounter: Payer: Self-pay | Admitting: Obstetrics & Gynecology

## 2019-07-10 VITALS — BP 120/71 | HR 128 | Wt 198.5 lb

## 2019-07-10 DIAGNOSIS — Z3A35 35 weeks gestation of pregnancy: Secondary | ICD-10-CM | POA: Diagnosis not present

## 2019-07-10 DIAGNOSIS — O0990 Supervision of high risk pregnancy, unspecified, unspecified trimester: Secondary | ICD-10-CM

## 2019-07-10 DIAGNOSIS — O0993 Supervision of high risk pregnancy, unspecified, third trimester: Secondary | ICD-10-CM

## 2019-07-10 DIAGNOSIS — K219 Gastro-esophageal reflux disease without esophagitis: Secondary | ICD-10-CM

## 2019-07-10 DIAGNOSIS — O99613 Diseases of the digestive system complicating pregnancy, third trimester: Secondary | ICD-10-CM

## 2019-07-10 DIAGNOSIS — O24415 Gestational diabetes mellitus in pregnancy, controlled by oral hypoglycemic drugs: Secondary | ICD-10-CM | POA: Diagnosis not present

## 2019-07-10 MED ORDER — PANTOPRAZOLE SODIUM 40 MG PO TBEC: 40.0000 mg | DELAYED_RELEASE_TABLET | Freq: Every day | ORAL | 2 refills | Status: DC

## 2019-07-10 NOTE — Progress Notes (Signed)
PRENATAL VISIT NOTE  Subjective:  Stacey [name] is a 34 y.o. G3P1011 at [redacted]w[redacted]d being seen today for ongoing prenatal care.  She is currently monitored for the following issues for this high-risk pregnancy and has Sickle cell trait (HCC); Alpha thalassemia trait; Hidradenitis; Supervision of high risk pregnancy, antepartum; Eczema; Gestational diabetes; and GBS bacteriuria on their problem list.  Patient reports heartburn, nausea and vomiting.  Contractions: Irregular. Vag. Bleeding: None.  Movement: Present. Denies leaking of fluid.   The following portions of the patient's history were reviewed and updated as appropriate: allergies, current medications, past family history, past medical history, past social history, past surgical history and problem list.   Objective:   Vitals:   07/10/19 0832  BP: 120/71  Pulse: (!) 128  Weight: 198 lb 8 oz (90 kg)    Fetal Status: Fetal Heart Rate (bpm): NST   Movement: Present     General:  Alert, oriented and cooperative. Patient is in no acute distress.  Skin: Skin is warm and dry. No rash noted.   Cardiovascular: Normal heart rate noted  Respiratory: Normal respiratory effort, no problems with respiration noted  Abdomen: Soft, gravid, appropriate for gestational age.  Pain/Pressure: Present     Pelvic: Cervical exam deferred        Extremities: Normal range of motion.  Edema: None  Mental Status: Normal mood and affect. Normal behavior. Normal judgment and thought content.   Imaging: [place] MFM FETAL BPP WO NON STRESS  Result Date: 07/08/2019 ----------------------------------------------------------------------  OBSTETRICS REPORT                       (Signed Final 07/08/2019 02:54 pm) ---------------------------------------------------------------------- Patient Info  ID #:       [id number]                          D.O.B.:  1986-03-02 (33 yrs)  Name:       Stacey [name]                   Visit Date: 07/08/2019 02:34 pm  ---------------------------------------------------------------------- Performed By  Performed By:     Emeline Darling BS,      Ref. Address:     [address]                    [place]                                                             [address]  Attending:        Ma Rings MD         [facility]  Referred By:      [facility] ---------------------------------------------------------------------- Orders   #  Description  Code         Ordered By   1  [place] MFM OB FOLLOW UP                  [id number]     PEGGY CONSTANT   2  [place] MFM FETAL BPP WO NON              76819.01     CHARLIE PICKENS      STRESS  ----------------------------------------------------------------------   #  Order #                    Accession #                 Episode #   1  [id number]                  [id number]                  [id number]   2  [id number]                  [id number]                  [id number]  ---------------------------------------------------------------------- Indications   Gestational diabetes in pregnancy,             O24.415   controlled by oral hypoglycemic drugs   History of sickle cell trait (HCC)             Z86.2   Genetic carrier (Alpha Thal Trait)             Z14.8   Low Risk NIPS (Negative AFP) (Negative   Horizon)   [redacted] weeks gestation of pregnancy                Z3A.35   Encounter for other antenatal screening        Z36.2   follow-up  ---------------------------------------------------------------------- Vital Signs                                                 Height:        5'6" ---------------------------------------------------------------------- Fetal Evaluation  Num Of Fetuses:         1  Fetal Heart Rate(bpm):  146  Cardiac Activity:       Observed  Presentation:           Cephalic  Placenta:               Posterior  P. Cord Insertion:      Previously Visualized  Amniotic Fluid  AFI FV:       Within normal limits  AFI Sum(cm)     %Tile       Largest Pocket(cm)  16.35           60          4.91  RUQ(cm)       RLQ(cm)       LUQ(cm)        LLQ(cm)  4.91          3.61          3.11           4.72 ---------------------------------------------------------------------- Biophysical Evaluation  Amniotic F.V:   Pocket < 2 cm two          F. Tone:  Observed                  planes  F. Movement:    Observed                   Score:          8/8  F. Breathing:   Observed ---------------------------------------------------------------------- Biometry  BPD:      79.3  mm     G. Age:  31w 6d        < 1  %    CI:        68.26   %    70 - 86                                                          FL/HC:      22.6   %    20.1 - 22.3  HC:      306.9  mm     G. Age:  34w 1d          6  %    HC/AC:      0.99        0.93 - 1.11  AC:      309.1  mm     G. Age:  34w 6d         49  %    FL/BPD:     87.4   %    71 - 87  FL:       69.3  mm     G. Age:  35w 4d         54  %    FL/AC:      22.4   %    20 - 24  Est. FW:    2484  gm      5 lb 8 oz     33  % ---------------------------------------------------------------------- OB History  Gravidity:    3         Term:   1  Living:       1 ---------------------------------------------------------------------- Gestational Age  LMP:           35w 1d        Date:  11/04/18                 EDD:   08/11/19  U/S Today:     34w 1d                                        EDD:   08/18/19  Best:          35w 1d     Det. By:  LMP  (11/04/18)          EDD:   08/11/19 ---------------------------------------------------------------------- Anatomy  Cranium:               Appears normal         LVOT:                   Previously seen  Cavum:                 Previously seen  Aortic Arch:            Previously seen  Ventricles:            Previously seen        Ductal Arch:            Previously seen  Choroid Plexus:        Previously seen        Diaphragm:              Appears normal   Cerebellum:            Previously seen        Stomach:                Appears normal, left                                                                        sided  Posterior Fossa:       Previously seen        Abdomen:                Appears normal  Nuchal Fold:           Not applicable (>20    Abdominal Wall:         Previously seen                         wks GA)  Face:                  Orbits and profile     Cord Vessels:           Previously seen                         previously seen  Lips:                  Previously seen        Kidneys:                Appear normal  Palate:                Previously seen        Bladder:                Appears normal  Thoracic:              Appears normal         Spine:                  Previously seen  Heart:                 Appears normal         Upper Extremities:      Previously seen                         (4CH, axis, and                         situs)  RVOT:  Previously seen        Lower Extremities:      Previously seen  Other:  Heels and 5th digit previously visualized. ---------------------------------------------------------------------- Cervix Uterus Adnexa  Cervix  Not visualized (advanced GA >24wks) ---------------------------------------------------------------------- Comments  This patient was seen for a follow up growth scan due to A2  gestational diabetes currently treated with Metformin.  She  denies any problems since her last exam.  She was informed that the fetal growth and amniotic fluid  level appears appropriate for her gestational age.  A biophysical profile performed today was 8 out of 8.  Due to A2 gestational diabetes, she should continue weekly  fetal testing until delivery. ----------------------------------------------------------------------                   Johnell Comings, MD Electronically Signed Final Report   07/08/2019 02:54 pm ----------------------------------------------------------------------  [place] MFM OB FOLLOW  UP  Result Date: 07/08/2019 ----------------------------------------------------------------------  OBSTETRICS REPORT                       (Signed Final 07/08/2019 02:54 pm) ---------------------------------------------------------------------- Patient Info  ID #:       [id number]                          D.O.B.:  02-03-1986 (33 yrs)  Name:       [name]                   Visit Date: 07/08/2019 02:34 pm ---------------------------------------------------------------------- Performed By  Performed By:     Jeanene Erb BS,      Ref. Address:     [address]                                                             [address]  Attending:        Johnell Comings MD         [facility]  Referred By:      [facility] ---------------------------------------------------------------------- Orders   #  Description                          Code         Ordered By   1  [place] MFM OB FOLLOW UP                  76816.01     PEGGY CONSTANT   2  [place] MFM FETAL BPP WO NON              81829.93     CHARLIE PICKENS      STRESS  ----------------------------------------------------------------------   #  Order #                    Accession #                 Episode #   1  [id number]                  [id number]                  [id number]   2  [id number]                  [id number]                  [id number]  ---------------------------------------------------------------------- Indications   Gestational diabetes in pregnancy,             O24.415   controlled by oral hypoglycemic drugs   History of sickle cell trait (HCC)             Z86.2   Genetic carrier (Alpha Thal Trait)             Z14.8   Low Risk NIPS (Negative AFP) (Negative   Horizon)   [redacted] weeks gestation of pregnancy                Z3A.35   Encounter for other antenatal screening        Z36.2   follow-up   ---------------------------------------------------------------------- Vital Signs                                                 Height:        5'6" ---------------------------------------------------------------------- Fetal Evaluation  Num Of Fetuses:         1  Fetal Heart Rate(bpm):  146  Cardiac Activity:       Observed  Presentation:           Cephalic  Placenta:               Posterior  P. Cord Insertion:      Previously Visualized  Amniotic Fluid  AFI FV:      Within normal limits  AFI Sum(cm)     %Tile       Largest Pocket(cm)  16.35           60          4.91  RUQ(cm)       RLQ(cm)       LUQ(cm)        LLQ(cm)  4.91          3.61          3.11           4.72 ---------------------------------------------------------------------- Biophysical Evaluation  Amniotic F.V:   Pocket < 2 cm two          F. Tone:        Observed                  planes  F. Movement:    Observed                   Score:          8/8  F. Breathing:   Observed ---------------------------------------------------------------------- Biometry  BPD:      79.3  mm     G. Age:  31w 6d        < 1  %    CI:        68.26   %    70 - 86                                                          FL/HC:      22.6   %    20.1 - 22.3  HC:      306.9  mm     G. Age:  34w 1d          6  %    HC/AC:      0.99        0.93 - 1.11  AC:      309.1  mm     G. Age:  34w 6d         49  %    FL/BPD:     87.4   %    71 - 87  FL:       69.3  mm     G. Age:  35w 4d         54  %    FL/AC:      22.4   %    20 - 24  Est. FW:    2484  gm      5 lb 8 oz     33  % ---------------------------------------------------------------------- OB History  Gravidity:    3         Term:   1  Living:       1 ---------------------------------------------------------------------- Gestational Age  LMP:           35w 1d        Date:  11/04/18                 EDD:   08/11/19  U/S Today:     34w 1d                                        EDD:   08/18/19  Best:          35w 1d     Det. By:   LMP  (11/04/18)          EDD:   08/11/19 ---------------------------------------------------------------------- Anatomy  Cranium:               Appears normal         LVOT:                   Previously seen  Cavum:                 Previously seen        Aortic Arch:            Previously seen  Ventricles:            Previously seen        Ductal Arch:            Previously seen  Choroid Plexus:        Previously seen        Diaphragm:  Appears normal  Cerebellum:            Previously seen        Stomach:                Appears normal, left                                                                        sided  Posterior Fossa:       Previously seen        Abdomen:                Appears normal  Nuchal Fold:           Not applicable (>20    Abdominal Wall:         Previously seen                         wks GA)  Face:                  Orbits and profile     Cord Vessels:           Previously seen                         previously seen  Lips:                  Previously seen        Kidneys:                Appear normal  Palate:                Previously seen        Bladder:                Appears normal  Thoracic:              Appears normal         Spine:                  Previously seen  Heart:                 Appears normal         Upper Extremities:      Previously seen                         (4CH, axis, and                         situs)  RVOT:                  Previously seen        Lower Extremities:      Previously seen  Other:  Heels and 5th digit previously visualized. ---------------------------------------------------------------------- Cervix Uterus Adnexa  Cervix  Not visualized (advanced GA >24wks) ---------------------------------------------------------------------- Comments  This patient was seen for a follow up growth scan due to A2  gestational diabetes currently treated with Metformin.  She  denies any problems since her last exam.  She was informed that the fetal growth and  amniotic  fluid  level appears appropriate for her gestational age.  A biophysical profile performed today was 8 out of 8.  Due to A2 gestational diabetes, she should continue weekly  fetal testing until delivery. ----------------------------------------------------------------------                   Ma RingsVictor Fang, MD Electronically Signed Final Report   07/08/2019 02:54 pm ----------------------------------------------------------------------  US FETAL BPP W/NONSTRESS  Result Date: 07/05/2019 ----------------------------------------------------------------------  OBSTETRICS REPORT                       (Signed Final 07/05/2019 02:54 pm) ---------------------------------------------------------------------- Patient Info  ID #:       [id number]                          D.O.B.:  03-27-1986 (33 yrs)  Name:       Stacey OharaJUDITH Thew                   Visit Date: 07/02/2019 09:03 am ---------------------------------------------------------------------- Performed By  Performed By:     Sedalia Mutaiane Day RNC          Ref. Address:     [address]                                                             [address]  Attending:        [facility] Bingharlie Pickens MD     [facility]  [facility] ---------------------------------------------------------------------- Orders   #  Description                          Code         Ordered By   1  US FETAL BPP W/NONSTRESS             40981.176818.4      Eddystone BingHARLIE PICKENS  ----------------------------------------------------------------------   #  Order #                    Accession #                 Episode #   1  [id number]                  [id number]                  [id number]  ---------------------------------------------------------------------- Service(s) Provided  US Fetal BPP W NST                                   [phone]   ---------------------------------------------------------------------- Indications   [redacted] weeks gestation of pregnancy                Z3A.34   Gestational diabetes in pregnancy,             O24.415   controlled by oral hypoglycemic drugs  ---------------------------------------------------------------------- Vital Signs                                                 Height:        5'6" ---------------------------------------------------------------------- Fetal Evaluation  Num Of Fetuses:         1  Preg. Location:         Intrauterine  Cardiac Activity:       Observed  Presentation:           Cephalic  Amniotic Fluid  AFI FV:      Within normal limits  AFI Sum(cm)     %Tile       Largest Pocket(cm)  13.81           48          3.74  RUQ(cm)       RLQ(cm)       LUQ(cm)        LLQ(cm)  3.74          2.89          3.64           3.54 ---------------------------------------------------------------------- Biophysical Evaluation  Amniotic F.V:   Pocket => 2 cm             F. Tone:        Observed  F. Movement:    Observed                   N.S.T:          Reactive  F. Breathing:   Observed                   Score:          10/10 ---------------------------------------------------------------------- OB History  Gravidity:    3         Term:   1  Living:       1 ---------------------------------------------------------------------- Gestational Age  LMP:           34w 2d        Date:  11/04/18                 EDD:   08/11/19  Best:          34w 2d     Det. By:  LMP  (11/04/18)          EDD:   08/11/19 ---------------------------------------------------------------------- Impression  Reassuring antenatal testing ---------------------------------------------------------------------- Recommendations  Continue with weekly testing ----------------------------------------------------------------------                 Addy Bing, MD Electronically Signed Final Report   07/05/2019 02:54 pm  ----------------------------------------------------------------------   Assessment and Plan:  Pregnancy: G3P1011 at [redacted]w[redacted]d 1. Gestational diabetes mellitus (GDM) in third trimester controlled on oral hypoglycemic drug  CBGs reviewed on [employer] app on phone, all within range. Continue Metformin. Recent growth scan is reassuring, also had BPP 8/8. NST performed today was reviewed and was found to be reactive.  Continue recommended antenatal testing and prenatal care.  2. Gastroesophageal reflux during pregnancy in third trimester, antepartum Already on Zofran, prescribed Protonix for GERD. - pantoprazole (PROTONIX) 40 MG tablet; Take 1 tablet (40 mg total) by mouth daily.  Dispense: 30 tablet; Refill: 2  3. Supervision of high risk pregnancy, antepartum Preterm labor symptoms and general obstetric precautions including but not limited to vaginal bleeding, contractions, leaking of fluid and fetal movement were reviewed in detail with the patient. Please refer to After Visit Summary for other counseling recommendations.   Return in about 6 days (around 07/16/2019) for weekly as scheduled. Also pelvic cultures next week..  Future Appointments  Date Time Provider Department Center  07/10/2019  9:15 AM Macon Large, Jethro Bastos, MD [facility]  07/16/2019  9:15 AM [facility] NST [facility]  07/16/2019 10:15 AM Constant, Gigi Gin, MD [facility]  07/23/2019  8:15 AM [facility] NST [facility]  07/23/2019  9:15 AM Adam Phenix, MD [facility]    Jaynie Collins, MD

## 2019-07-10 NOTE — Patient Instructions (Signed)
Return to office for any scheduled appointments. Call the office or go to the MAU at [facility] [place] if:  You begin to have strong, frequent contractions  Your water breaks.  Sometimes it is a big gush of fluid, sometimes it is just a trickle that keeps getting your panties wet or running down your legs  You have vaginal bleeding.  It is normal to have a small amount of spotting if your cervix was checked.   You do not feel your baby moving like normal.  If you do not, get something to eat and drink and lay down and focus on feeling your baby move.   If your baby is still not moving like normal, you should call the office or go to MAU.  Any other obstetric concerns.   

## 2019-07-10 NOTE — Progress Notes (Signed)
Pt had [place] growth & BPP on 2/9.  Pt reports she is still having vomiting daily. She also states that she is having side effects from Clobetasole ointment.

## 2019-07-16 ENCOUNTER — Ambulatory Visit: Payer: Self-pay

## 2019-07-16 ENCOUNTER — Ambulatory Visit (INDEPENDENT_AMBULATORY_CARE_PROVIDER_SITE_OTHER): Payer: Medicaid Other | Admitting: *Deleted

## 2019-07-16 ENCOUNTER — Ambulatory Visit (INDEPENDENT_AMBULATORY_CARE_PROVIDER_SITE_OTHER): Payer: Medicaid Other | Admitting: Obstetrics and Gynecology

## 2019-07-16 ENCOUNTER — Encounter: Payer: Self-pay | Admitting: Obstetrics and Gynecology

## 2019-07-16 ENCOUNTER — Other Ambulatory Visit: Payer: Self-pay

## 2019-07-16 ENCOUNTER — Other Ambulatory Visit (HOSPITAL_COMMUNITY)
Admission: RE | Admit: 2019-07-16 | Discharge: 2019-07-16 | Disposition: A | Discharge status: Home / Self Care | Payer: Medicaid Other | Source: Ambulatory Visit | Attending: Obstetrics and Gynecology | Admitting: Obstetrics and Gynecology

## 2019-07-16 VITALS — BP 120/73 | HR 123 | Wt 195.5 lb

## 2019-07-16 DIAGNOSIS — R000 Tachycardia, unspecified: Secondary | ICD-10-CM

## 2019-07-16 DIAGNOSIS — O24415 Gestational diabetes mellitus in pregnancy, controlled by oral hypoglycemic drugs: Secondary | ICD-10-CM

## 2019-07-16 DIAGNOSIS — Z3A36 36 weeks gestation of pregnancy: Secondary | ICD-10-CM

## 2019-07-16 DIAGNOSIS — O0990 Supervision of high risk pregnancy, unspecified, unspecified trimester: Secondary | ICD-10-CM

## 2019-07-16 DIAGNOSIS — O99820 Streptococcus B carrier state complicating pregnancy: Secondary | ICD-10-CM

## 2019-07-16 DIAGNOSIS — R8271 Bacteriuria: Secondary | ICD-10-CM

## 2019-07-16 NOTE — Progress Notes (Signed)
   PRENATAL VISIT NOTE  Subjective:  Stacey [name] is a 34 y.o. G3P1011 at [redacted]w[redacted]d being seen today for ongoing prenatal care.  She is currently monitored for the following issues for this high-risk pregnancy and has Sickle cell trait (HCC); Alpha thalassemia trait; Hidradenitis; Supervision of high risk pregnancy, antepartum; Eczema; Gestational diabetes; and GBS bacteriuria on their problem list.  Patient reports no complaints.  Contractions: Irregular. Vag. Bleeding: None.  Movement: Present. Denies leaking of fluid.   The following portions of the patient's history were reviewed and updated as appropriate: allergies, current medications, past family history, past medical history, past social history, past surgical history and problem list.   Objective:   Vitals:   07/16/19 0947  BP: 110/69  Pulse: (!) 140  Weight: 195 lb 8 oz (88.7 kg)    Fetal Status: Fetal Heart Rate (bpm): NST   Movement: Present     General:  Alert, oriented and cooperative. Patient is in no acute distress.  Skin: Skin is warm and dry. No rash noted.   Cardiovascular: Normal heart rate noted  Respiratory: Normal respiratory effort, no problems with respiration noted  Abdomen: Soft, gravid, appropriate for gestational age.  Pain/Pressure: Present     Pelvic: Cervical exam deferred        Extremities: Normal range of motion.  Edema: None  Mental Status: Normal mood and affect. Normal behavior. Normal judgment and thought content.   Assessment and Plan:  Pregnancy: G3P1011 at [redacted]w[redacted]d 1. Supervision of high risk pregnancy, antepartum Patient is doing well Patient reports heart palpitation and noted to have tachycardia the past few visits TSH today Cardiology referral  2. GBS bacteriuria Prophylaxis in labor   3. Gestational diabetes mellitus (GDM) in third trimester controlled on oral hypoglycemic drug CBGs reviewed and all within range on metformin Continue current regimen Continue antenatal  testing  Preterm labor symptoms and general obstetric precautions including but not limited to vaginal bleeding, contractions, leaking of fluid and fetal movement were reviewed in detail with the patient. Please refer to After Visit Summary for other counseling recommendations.   No follow-ups on file.  Future Appointments  Date Time Provider Department Center  07/16/2019 10:35 AM [facility]  07/23/2019  8:15 AM [facility] NST [facility]  07/23/2019  9:15 AM Adam Phenix, MD [facility]    Catalina Antigua, MD

## 2019-07-16 NOTE — Progress Notes (Signed)
Pt states she feels that her heart is "racing" all the time. She also reports SOB and denies chest pain.

## 2019-07-17 LAB — CERVICOVAGINAL ANCILLARY ONLY
Chlamydia: NEGATIVE
Comment: NEGATIVE
Comment: NORMAL
Neisseria Gonorrhea: NEGATIVE

## 2019-07-17 LAB — TSH: TSH: 1.18 u[IU]/mL (ref 0.450–4.500)

## 2019-07-23 ENCOUNTER — Other Ambulatory Visit: Payer: Self-pay

## 2019-07-23 ENCOUNTER — Ambulatory Visit (INDEPENDENT_AMBULATORY_CARE_PROVIDER_SITE_OTHER): Payer: Medicaid Other | Admitting: *Deleted

## 2019-07-23 ENCOUNTER — Ambulatory Visit (INDEPENDENT_AMBULATORY_CARE_PROVIDER_SITE_OTHER): Payer: Medicaid Other | Admitting: Obstetrics & Gynecology

## 2019-07-23 ENCOUNTER — Ambulatory Visit: Payer: Self-pay

## 2019-07-23 VITALS — BP 131/77 | HR 127 | Wt 198.6 lb

## 2019-07-23 DIAGNOSIS — Z3A37 37 weeks gestation of pregnancy: Secondary | ICD-10-CM

## 2019-07-23 DIAGNOSIS — D573 Sickle-cell trait: Secondary | ICD-10-CM

## 2019-07-23 DIAGNOSIS — R8271 Bacteriuria: Secondary | ICD-10-CM

## 2019-07-23 DIAGNOSIS — O0990 Supervision of high risk pregnancy, unspecified, unspecified trimester: Secondary | ICD-10-CM

## 2019-07-23 DIAGNOSIS — O99820 Streptococcus B carrier state complicating pregnancy: Secondary | ICD-10-CM

## 2019-07-23 DIAGNOSIS — O24415 Gestational diabetes mellitus in pregnancy, controlled by oral hypoglycemic drugs: Secondary | ICD-10-CM

## 2019-07-23 DIAGNOSIS — O285 Abnormal chromosomal and genetic finding on antenatal screening of mother: Secondary | ICD-10-CM

## 2019-07-23 LAB — POCT URINALYSIS DIP (DEVICE)
Bilirubin Urine: NEGATIVE
Bilirubin Urine: NEGATIVE
Glucose, UA: NEGATIVE mg/dL
Glucose, UA: NEGATIVE mg/dL
Hgb urine dipstick: NEGATIVE
Hgb urine dipstick: NEGATIVE
Ketones, ur: NEGATIVE mg/dL
Ketones, ur: NEGATIVE mg/dL
Leukocytes,Ua: NEGATIVE
Leukocytes,Ua: NEGATIVE
Nitrite: NEGATIVE
Nitrite: NEGATIVE
Protein, ur: NEGATIVE mg/dL
Protein, ur: NEGATIVE mg/dL
Specific Gravity, Urine: 1.01 (ref 1.005–1.030)
Specific Gravity, Urine: 1.01 (ref 1.005–1.030)
Urobilinogen, UA: 0.2 mg/dL (ref 0.0–1.0)
Urobilinogen, UA: 0.2 mg/dL (ref 0.0–1.0)
pH: 5.5 (ref 5.0–8.0)
pH: 6 (ref 5.0–8.0)

## 2019-07-23 NOTE — Patient Instructions (Signed)

## 2019-07-23 NOTE — Progress Notes (Signed)
   PRENATAL VISIT NOTE  Subjective:  Stacey [name] is a 34 y.o. G3P1011 at [redacted]w[redacted]d being seen today for ongoing prenatal care.  She is currently monitored for the following issues for this high-risk pregnancy and has Sickle cell trait (HCC); Alpha thalassemia trait; Hidradenitis; Supervision of high risk pregnancy, antepartum; Eczema; Gestational diabetes; and GBS bacteriuria on their problem list.  Patient reports no complaints.  Contractions: Irregular. Vag. Bleeding: None.  Movement: Present. Denies leaking of fluid.   The following portions of the patient's history were reviewed and updated as appropriate: allergies, current medications, past family history, past medical history, past social history, past surgical history and problem list.   Objective:   Vitals:   07/23/19 0827  BP: 131/77  Pulse: (!) 127  Weight: 198 lb 9.6 oz (90.1 kg)    Fetal Status: Fetal Heart Rate (bpm): NST   Movement: Present     General:  Alert, oriented and cooperative. Patient is in no acute distress.  Skin: Skin is warm and dry. No rash noted.   Cardiovascular: Normal heart rate noted  Respiratory: Normal respiratory effort, no problems with respiration noted  Abdomen: Soft, gravid, appropriate for gestational age.  Pain/Pressure: Present     Pelvic: Cervical exam deferred        Extremities: Normal range of motion.  Edema: None  Mental Status: Normal mood and affect. Normal behavior. Normal judgment and thought content.   Assessment and Plan:  Pregnancy: G3P1011 at [redacted]w[redacted]d 1. Supervision of high risk pregnancy, antepartum F/u NST is normal reactive today  2. Gestational diabetes mellitus (GDM) in third trimester controlled on oral hypoglycemic drug FBS 25% above 95 to 105, due to diet. PP 90% in range  3. GBS bacteriuria Treat in labor  Preterm labor symptoms and general obstetric precautions including but not limited to vaginal bleeding, contractions, leaking of fluid and fetal movement were  reviewed in detail with the patient. Please refer to After Visit Summary for other counseling recommendations.   Return in about 1 week (around 07/30/2019) for [facility] and NST/BPP.  Future Appointments  Date Time Provider Department Center  07/23/2019  9:35 AM [facility] IMAGING [facility] WOC  08/06/2019 12:00 AM MC-LD SCHED ROOM MC-INDC None    Scheryl Darter, MD

## 2019-07-24 ENCOUNTER — Telehealth (HOSPITAL_COMMUNITY): Payer: Self-pay | Admitting: *Deleted

## 2019-07-24 NOTE — Telephone Encounter (Signed)
Preadmission screen  

## 2019-07-25 ENCOUNTER — Telehealth (HOSPITAL_COMMUNITY): Payer: Self-pay | Admitting: *Deleted

## 2019-07-25 ENCOUNTER — Encounter (HOSPITAL_COMMUNITY): Payer: Self-pay | Admitting: *Deleted

## 2019-07-25 NOTE — Telephone Encounter (Signed)
Preadmission screen  

## 2019-07-28 ENCOUNTER — Other Ambulatory Visit: Payer: Self-pay

## 2019-07-28 ENCOUNTER — Encounter (HOSPITAL_COMMUNITY): Payer: Self-pay | Admitting: Obstetrics & Gynecology

## 2019-07-28 ENCOUNTER — Inpatient Hospital Stay (HOSPITAL_COMMUNITY)
Admission: AD | Admit: 2019-07-28 | Discharge: 2019-07-28 | Disposition: A | Discharge status: Home / Self Care | Payer: Medicaid Other | Attending: Obstetrics & Gynecology | Admitting: Obstetrics & Gynecology

## 2019-07-28 DIAGNOSIS — Z3A38 38 weeks gestation of pregnancy: Secondary | ICD-10-CM | POA: Diagnosis not present

## 2019-07-28 DIAGNOSIS — O471 False labor at or after 37 completed weeks of gestation: Secondary | ICD-10-CM | POA: Insufficient documentation

## 2019-07-28 DIAGNOSIS — O0990 Supervision of high risk pregnancy, unspecified, unspecified trimester: Secondary | ICD-10-CM

## 2019-07-28 DIAGNOSIS — Z3689 Encounter for other specified antenatal screening: Secondary | ICD-10-CM | POA: Diagnosis not present

## 2019-07-28 NOTE — Discharge Instructions (Signed)

## 2019-07-28 NOTE — MAU Note (Signed)
Stacey [name] is a 34 y.o. at [redacted]w[redacted]d here in MAU reporting: contractions since Saturday, unsure about how often. They have been getting closer. No vaginal bleeding or LOF.  Onset of complaint: Saturday  Pain score: 6/10  Vitals:   07/28/19 1403  BP: 115/69  Pulse: (!) 106  Resp: 16  Temp: 98.5 F (36.9 C)  SpO2: 99%     FHT: +FM  Lab orders placed from triage: none

## 2019-07-30 ENCOUNTER — Ambulatory Visit (INDEPENDENT_AMBULATORY_CARE_PROVIDER_SITE_OTHER): Payer: Medicaid Other | Admitting: *Deleted

## 2019-07-30 ENCOUNTER — Other Ambulatory Visit: Payer: Self-pay

## 2019-07-30 ENCOUNTER — Ambulatory Visit: Payer: Self-pay

## 2019-07-30 ENCOUNTER — Other Ambulatory Visit: Payer: Self-pay | Admitting: Advanced Practice Midwife

## 2019-07-30 ENCOUNTER — Ambulatory Visit (INDEPENDENT_AMBULATORY_CARE_PROVIDER_SITE_OTHER): Payer: Medicaid Other

## 2019-07-30 VITALS — BP 122/75 | HR 108 | Wt 197.4 lb

## 2019-07-30 DIAGNOSIS — O0993 Supervision of high risk pregnancy, unspecified, third trimester: Secondary | ICD-10-CM

## 2019-07-30 DIAGNOSIS — O24415 Gestational diabetes mellitus in pregnancy, controlled by oral hypoglycemic drugs: Secondary | ICD-10-CM

## 2019-07-30 DIAGNOSIS — Z3A38 38 weeks gestation of pregnancy: Secondary | ICD-10-CM

## 2019-07-30 DIAGNOSIS — O0990 Supervision of high risk pregnancy, unspecified, unspecified trimester: Secondary | ICD-10-CM

## 2019-07-30 LAB — POCT URINALYSIS DIP (DEVICE)
Bilirubin Urine: NEGATIVE
Glucose, UA: NEGATIVE mg/dL
Hgb urine dipstick: NEGATIVE
Ketones, ur: NEGATIVE mg/dL
Leukocytes,Ua: NEGATIVE
Nitrite: NEGATIVE
Protein, ur: NEGATIVE mg/dL
Specific Gravity, Urine: 1.01 (ref 1.005–1.030)
Urobilinogen, UA: 0.2 mg/dL (ref 0.0–1.0)
pH: 5.5 (ref 5.0–8.0)

## 2019-07-30 NOTE — Progress Notes (Signed)
   PRENATAL VISIT NOTE  Subjective:  Stacey [name] is a 34 y.o. G3P1011 at [redacted]w[redacted]d who presents today for routine prenatal care.  She is currently being monitored for supervision of a high-risk pregnancy with problems as listed below.  Patient has no pregnancy related concerns and endorses fetal movement.  She denies vaginal concerns including discharge, bleeding, leaking, itching, and burning. Patient reports contractions that are infrequent, but causing pain and distress.   Patient Active Problem List   Diagnosis Date Noted  . GBS bacteriuria 07/07/2019  . Gestational diabetes 05/15/2019  . Eczema 04/16/2019  . Supervision of high risk pregnancy, antepartum 01/20/2019  . Hidradenitis 08/01/2017  . Sickle cell trait (HCC) 02/14/2017  . Alpha thalassemia trait 02/14/2017    The following portions of the patient's history were reviewed and updated as appropriate: allergies, current medications, past family history, past medical history, past social history, past surgical history and problem list. Problem list updated.  Objective:   Vitals:   07/30/19 0948  BP: 122/75  Pulse: (!) 108  Weight: 197 lb 6.4 oz (89.5 kg)    Fetal Status: Fetal Heart Rate (bpm): RNST Fundal Height: 40 cm Movement: Present  Presentation: Vertex  General:  Alert, oriented and cooperative. Patient is in no acute distress.  Skin: Skin is warm and dry.   Cardiovascular: Regular rate and rhythm.  Respiratory: Normal respiratory effort. CTA-Bilaterally  Abdomen: Soft, gravid, appropriate for gestational age.  Pelvic: Cervical exam performed Dilation: 3.5 Effacement (%): 50 Station: Ballotable  Extremities: Normal range of motion.  Edema: None  Mental Status: Normal mood and affect. Normal behavior. Normal judgment and thought content.   Assessment and Plan:  Pregnancy: G3P1011 at [redacted]w[redacted]d  1. Supervision of high risk pregnancy, antepartum -NST reactive -Discussed IOL procedures. -Reviewed cervical exam  findings.  Informed that labor can start at anytime. -Precautions Discussed  2. Gestational diabetes mellitus (GDM) in third trimester controlled on oral hypoglycemic drug -Reports sugars have been in normal range -Taking Metformin as prescribed   Term labor symptoms and general obstetric precautions including but not limited to vaginal bleeding, contractions, leaking of fluid and fetal movement were reviewed with the patient.  Please refer to After Visit Summary for other counseling recommendations.  Return for IOL 3/10.  Future Appointments  Date Time Provider Department Center  07/30/2019 11:15 AM Gerrit Heck, CNM WOC-WOCA WOC  08/04/2019  9:30 AM MC-SCREENING MC-SDSC None  08/06/2019 12:00 AM MC-LD SCHED ROOM MC-INDC None    Cherre Robins, CNM 07/30/2019, 11:06 AM

## 2019-07-30 NOTE — Progress Notes (Signed)
IOL scheduled on 3/10 @ midnight

## 2019-07-31 ENCOUNTER — Other Ambulatory Visit (HOSPITAL_COMMUNITY): Payer: Self-pay | Admitting: Advanced Practice Midwife

## 2019-07-31 ENCOUNTER — Other Ambulatory Visit: Payer: Self-pay

## 2019-07-31 ENCOUNTER — Other Ambulatory Visit: Payer: Medicaid Other

## 2019-07-31 ENCOUNTER — Encounter (HOSPITAL_COMMUNITY): Payer: Self-pay | Admitting: Obstetrics & Gynecology

## 2019-07-31 ENCOUNTER — Inpatient Hospital Stay (HOSPITAL_COMMUNITY)
Admission: AD | Admit: 2019-07-31 | Discharge: 2019-08-03 | DRG: 807 | Disposition: A | Discharge status: Home / Self Care | Payer: Medicaid Other | Attending: Obstetrics & Gynecology | Admitting: Obstetrics & Gynecology

## 2019-07-31 DIAGNOSIS — D573 Sickle-cell trait: Secondary | ICD-10-CM | POA: Diagnosis present

## 2019-07-31 DIAGNOSIS — O24425 Gestational diabetes mellitus in childbirth, controlled by oral hypoglycemic drugs: Principal | ICD-10-CM | POA: Diagnosis present

## 2019-07-31 DIAGNOSIS — O99824 Streptococcus B carrier state complicating childbirth: Secondary | ICD-10-CM | POA: Diagnosis present

## 2019-07-31 DIAGNOSIS — Z3A38 38 weeks gestation of pregnancy: Secondary | ICD-10-CM

## 2019-07-31 DIAGNOSIS — O24424 Gestational diabetes mellitus in childbirth, insulin controlled: Secondary | ICD-10-CM | POA: Diagnosis not present

## 2019-07-31 DIAGNOSIS — O9902 Anemia complicating childbirth: Secondary | ICD-10-CM | POA: Diagnosis present

## 2019-07-31 DIAGNOSIS — Z20822 Contact with and (suspected) exposure to covid-19: Secondary | ICD-10-CM | POA: Diagnosis present

## 2019-07-31 DIAGNOSIS — O26893 Other specified pregnancy related conditions, third trimester: Secondary | ICD-10-CM | POA: Diagnosis present

## 2019-07-31 DIAGNOSIS — O24415 Gestational diabetes mellitus in pregnancy, controlled by oral hypoglycemic drugs: Secondary | ICD-10-CM

## 2019-07-31 DIAGNOSIS — O24419 Gestational diabetes mellitus in pregnancy, unspecified control: Secondary | ICD-10-CM | POA: Diagnosis present

## 2019-07-31 DIAGNOSIS — O0990 Supervision of high risk pregnancy, unspecified, unspecified trimester: Secondary | ICD-10-CM

## 2019-07-31 DIAGNOSIS — R8271 Bacteriuria: Secondary | ICD-10-CM | POA: Diagnosis present

## 2019-07-31 LAB — RESPIRATORY PANEL BY RT PCR (FLU A&B, COVID)
Influenza A by PCR: NEGATIVE
Influenza B by PCR: NEGATIVE
SARS Coronavirus 2 by RT PCR: NEGATIVE

## 2019-07-31 LAB — TYPE AND SCREEN
ABO/RH(D): B POS
Antibody Screen: NEGATIVE

## 2019-07-31 MED ORDER — LIDOCAINE HCL (PF) 1 % IJ SOLN: 30.0000 mL | INTRAMUSCULAR | Status: DC | PRN

## 2019-07-31 MED ORDER — ONDANSETRON HCL 4 MG/2ML IJ SOLN: 4.0000 mg | Freq: Four times a day (QID) | INTRAMUSCULAR | Status: DC | PRN

## 2019-07-31 MED ORDER — SOD CITRATE-CITRIC ACID 500-334 MG/5ML PO SOLN: 30.0000 mL | ORAL | Status: DC | PRN

## 2019-07-31 MED ORDER — FENTANYL CITRATE (PF) 100 MCG/2ML IJ SOLN
100.0000 ug | INTRAMUSCULAR | Status: DC | PRN
  Administered 2019-08-01 (×2): 100 ug via INTRAVENOUS
  Filled 2019-07-31 (×2): qty 2

## 2019-07-31 MED ORDER — OXYCODONE-ACETAMINOPHEN 5-325 MG PO TABS: 1.0000 | ORAL_TABLET | ORAL | Status: DC | PRN

## 2019-07-31 MED ORDER — FLEET ENEMA 7-19 GM/118ML RE ENEM: 1.0000 | ENEMA | RECTAL | Status: DC | PRN

## 2019-07-31 MED ORDER — OXYTOCIN BOLUS FROM INFUSION
500.0000 mL | Freq: Once | INTRAVENOUS | Status: AC
  Administered 2019-08-01: 500 mL via INTRAVENOUS

## 2019-07-31 MED ORDER — PENICILLIN G POT IN DEXTROSE 60000 UNIT/ML IV SOLN
3.0000 10*6.[IU] | INTRAVENOUS | Status: DC
  Filled 2019-07-31 (×4): qty 50

## 2019-07-31 MED ORDER — LACTATED RINGERS IV SOLN: INTRAVENOUS | Status: DC

## 2019-07-31 MED ORDER — SODIUM CHLORIDE 0.9 % IV SOLN
5.0000 10*6.[IU] | Freq: Once | INTRAVENOUS | Status: AC
  Administered 2019-07-31: 5 10*6.[IU] via INTRAVENOUS
  Filled 2019-07-31: qty 5

## 2019-07-31 MED ORDER — OXYCODONE-ACETAMINOPHEN 5-325 MG PO TABS: 2.0000 | ORAL_TABLET | ORAL | Status: DC | PRN

## 2019-07-31 MED ORDER — LACTATED RINGERS IV SOLN: 500.0000 mL | INTRAVENOUS | Status: DC | PRN

## 2019-07-31 MED ORDER — OXYTOCIN 40 UNITS IN NORMAL SALINE INFUSION - SIMPLE MED
2.5000 [IU]/h | INTRAVENOUS | Status: DC
  Filled 2019-07-31: qty 1000

## 2019-07-31 MED ORDER — ACETAMINOPHEN 325 MG PO TABS: 650.0000 mg | ORAL_TABLET | ORAL | Status: DC | PRN

## 2019-07-31 NOTE — H&P (Addendum)
OBSTETRIC ADMISSION HISTORY AND PHYSICAL  Stacey [name] is a 34 y.o. female G3P1011 with IUP at 34w3dby LMP presenting for spontaneous labor. She reports +FMs, No LOF, no VB, no blurry vision, headaches or peripheral edema, and RUQ pain.  She plans on breast feeding. She states she is not currently interested in contraception. [place] [facility] --->  Estimated Date of Delivery: 08/11/19  Sono:   _0 , CWD, normal anatomy, cephalic presentation  Prenatal History/Complications: AH6WVP(metformin) GBS bacteriuria Sickle Cell Trait Alpha thalassemia trait  Past Medical History: Past Medical History:  Diagnosis Date  . Gestational diabetes   . Medical history non-contributory     Past Surgical History: Past Surgical History:  Procedure Laterality Date  . NO PAST SURGERIES      Obstetrical History: OB History    Gravida  3   Para  1   Term  1   Preterm      AB  1   Living  1     SAB  1   TAB      Ectopic      Multiple  0   Live Births  1           Social History Social History   Socioeconomic History  . Marital status: Single    Spouse name: Not on file  . Number of children: Not on file  . Years of education: Not on file  . Highest education level: Not on file  Occupational History  . Not on file  Tobacco Use  . Smoking status: Never Smoker  . Smokeless tobacco: Never Used  Substance and Sexual Activity  . Alcohol use: No  . Drug use: No  . Sexual activity: Yes    Partners: Male    Birth control/protection: None  Other Topics Concern  . Not on file  Social History Narrative  . Not on file   Social Determinants of Health   Financial Resource Strain:   . Difficulty of Paying Living Expenses: Not on file  Food Insecurity: No Food Insecurity  . Worried About RCharity fundraiserin the Last Year: Never true  . Ran Out of Food in the Last Year: Never true  Transportation Needs: No Transportation  Needs  . Lack of Transportation (Medical): No  . Lack of Transportation (Non-Medical): No  Physical Activity:   . Days of Exercise per Week: Not on file  . Minutes of Exercise per Session: Not on file  Stress:   . Feeling of Stress : Not on file  Social Connections:   . Frequency of Communication with Friends and Family: Not on file  . Frequency of Social Gatherings with Friends and Family: Not on file  . Attends Religious Services: Not on file  . Active Member of Clubs or Organizations: Not on file  . Attends CArchivistMeetings: Not on file  . Marital Status: Not on file    Family History: Family History  Problem Relation Age of Onset  . Hypertension Mother     Allergies: No Known Allergies  Medications Prior to Admission  Medication Sig Dispense Refill Last Dose  . doxylamine, Sleep, (UNISOM) 25 MG tablet Take 1 tablet (25 mg total) by mouth at bedtime as needed. 30 tablet 0 07/31/2019 at Unknown time  . metFORMIN (GLUCOPHAGE) 500 MG tablet Take 2 tablets (1,000 mg total) by mouth daily with supper. 30 tablet 1 07/31/2019 at Unknown time  .  ondansetron (ZOFRAN ODT) 8 MG disintegrating tablet Take 1 tablet (8 mg total) by mouth every 8 (eight) hours as needed for nausea or vomiting. 40 tablet 3 Past Week at Unknown time  . pantoprazole (PROTONIX) 40 MG tablet Take 1 tablet (40 mg total) by mouth daily. 30 tablet 2 07/31/2019 at Unknown time  . prenatal vitamin w/FE, FA (PRENATAL 1 + 1) 27-1 MG TABS tablet Take 1 tablet by mouth daily at 12 noon.   07/31/2019 at Unknown time  . Accu-Chek Softclix Lancets lancets Use four times per day 100 each 12   . acetaminophen (TYLENOL) 325 MG tablet Take 650 mg by mouth every 6 (six) hours as needed for moderate pain.   Unknown at Unknown time  . Blood Pressure Monitoring (BLOOD PRESSURE KIT) DEVI 1 Device by Does not apply route as needed. ICD 10: Z39.00 (Patient not taking: Reported on 07/10/2019) 1 Device 0   . clobetasol ointment  (TEMOVATE) 9.32 % Apply 1 application topically 2 (two) times daily. (Patient not taking: Reported on 07/30/2019) 60 g 1   . glucose blood (ACCU-CHEK GUIDE) test strip Check blood sugar four times per day 100 each 12      Review of Systems   All systems reviewed and negative except as stated in HPI  Blood pressure 122/83, pulse (!) 117, temperature 98.6 F (37 C), temperature source Oral, resp. rate 20, height 5' 6" (1.676 m), weight 89.9 kg, last menstrual period 11/04/2018, SpO2 100 %, unknown if currently breastfeeding. General appearance: alert, cooperative and no distress Lungs: clear to auscultation bilaterally Heart: mildly tachycardic rate, normal rhythm Abdomen: soft, non-tender; bowel sounds normal Pelvic: Deferred Extremities: Homans sign is negative, no sign of DVT Presentation: cephalic Fetal monitoringBaseline: 140 bpm, Variability: Good {> 6 bpm) and Accelerations: Reactive Uterine activityFrequency: Every 4 minutes Dilation: 5 Effacement (%): 80 Station: -2 Exam by:: DCALLAWAY, RN  Prenatal labs: ABO, Rh: --/--/B POS, B POS Performed at [facility], [address] [place], [place] [address]  [phone]) Antibody: NEG (03/04 2319) Rubella: 23.30 (09/11 1133) RPR: Non Reactive (12/15 0858)  HBsAg: Negative (09/11 1133)  HIV: Non Reactive (12/15 0858)  GBS:   Positive 1 hr Glucola 138 Genetic screening  NIPS - normal female, AFP negative Anatomy US Normal  Prenatal Transfer Tool  Maternal Diabetes: Yes:  Diabetes Type:  Insulin/Medication controlled Genetic Screening: Normal Maternal Ultrasounds/Referrals: Normal Fetal Ultrasounds or other Referrals:  None Maternal Substance Abuse:  No Significant Maternal Medications:  Meds include: Other: metformin Significant Maternal Lab Results: Group B Strep positive  Results for orders placed or performed during the hospital encounter of 07/31/19 (from the past 24 hour(s))  Respiratory Panel by RT PCR (Flu  A&B, Covid) - Nasopharyngeal Swab   Collection Time: 07/31/19 11:00 PM   Specimen: Nasopharyngeal Swab  Result Value Ref Range   SARS Coronavirus 2 by RT PCR NEGATIVE NEGATIVE   Influenza A by PCR NEGATIVE NEGATIVE   Influenza B by PCR NEGATIVE NEGATIVE  Type and screen [place]   Collection Time: 07/31/19 11:19 PM  Result Value Ref Range   ABO/RH(D) B POS    Antibody Screen NEG    Sample Expiration      08/03/2019,2359 Performed at [facility], [place] [address] [place], [place] [address]   ABO/Rh   Collection Time: 07/31/19 11:19 PM  Result Value Ref Range   ABO/RH(D)      B POS Performed at [facility], [place] [address] [place], [place] [address]     Patient Active Problem List   Diagnosis Date Noted  . Normal labor 07/31/2019  . GBS bacteriuria 07/07/2019  . Gestational diabetes 05/15/2019  . Eczema 04/16/2019  . Supervision of high risk pregnancy, antepartum 01/20/2019  . Hidradenitis 08/01/2017  . Sickle cell trait ([place]) 02/14/2017  . Alpha thalassemia trait 02/14/2017    Assessment/Plan:  Stacey [name] is a 34 y.o. G3P1011 at 64w3dhere for spontaneous labor with a history of gestational diabetes.  #Labor: Expectant management. May consider augmentation after adequate GBS prophylaxis #Pain: Per patient request #FWB: Cat I, EFW 7.5-8# #ID: GBS+ in urine, PCN #MOF: Breast  #MOC: Declines #Circ:  Yes, outpatient #A2GDM: CBG checks q4hr, q2hr in active labor  RLurline Del DO  08/01/2019, 12:03 AM  GME ATTESTATION:  I saw and evaluated the patient. I agree with the findings and the plan of care as documented in the resident's note.  HMerilyn Baba DO OB Fellow, [place]3/09/2019 12:11 AM

## 2019-07-31 NOTE — MAU Note (Signed)
PT SAYS UC STRONG  AT 7 PM. PNC WITH CLINIC. VE YESTERDAY 4 CM .  DENIES HSV AND MRSA.  GBS- POSITIVE

## 2019-08-01 ENCOUNTER — Encounter (HOSPITAL_COMMUNITY): Payer: Self-pay | Admitting: Family Medicine

## 2019-08-01 DIAGNOSIS — O99824 Streptococcus B carrier state complicating childbirth: Secondary | ICD-10-CM

## 2019-08-01 DIAGNOSIS — Z3A38 38 weeks gestation of pregnancy: Secondary | ICD-10-CM

## 2019-08-01 DIAGNOSIS — O24424 Gestational diabetes mellitus in childbirth, insulin controlled: Secondary | ICD-10-CM

## 2019-08-01 LAB — CBC
HCT: 35.2 % — ABNORMAL LOW (ref 36.0–46.0)
HCT: 35.6 % — ABNORMAL LOW (ref 36.0–46.0)
Hemoglobin: 11.2 g/dL — ABNORMAL LOW (ref 12.0–15.0)
Hemoglobin: 11.5 g/dL — ABNORMAL LOW (ref 12.0–15.0)
MCH: 23.3 pg — ABNORMAL LOW (ref 26.0–34.0)
MCH: 23.6 pg — ABNORMAL LOW (ref 26.0–34.0)
MCHC: 31.8 g/dL (ref 30.0–36.0)
MCHC: 32.3 g/dL (ref 30.0–36.0)
MCV: 73.1 fL — ABNORMAL LOW (ref 80.0–100.0)
MCV: 73.3 fL — ABNORMAL LOW (ref 80.0–100.0)
Platelets: 290 10*3/uL (ref 150–400)
Platelets: 304 10*3/uL (ref 150–400)
RBC: 4.8 MIL/uL (ref 3.87–5.11)
RBC: 4.87 MIL/uL (ref 3.87–5.11)
RDW: 14.7 % (ref 11.5–15.5)
RDW: 14.7 % (ref 11.5–15.5)
WBC: 10.3 10*3/uL (ref 4.0–10.5)
WBC: 7.7 10*3/uL (ref 4.0–10.5)
nRBC: 0 % (ref 0.0–0.2)
nRBC: 0 % (ref 0.0–0.2)

## 2019-08-01 LAB — ABO/RH: ABO/RH(D): B POS

## 2019-08-01 MED ORDER — DIPHENHYDRAMINE HCL 25 MG PO CAPS: 25.0000 mg | ORAL_CAPSULE | Freq: Four times a day (QID) | ORAL | Status: DC | PRN

## 2019-08-01 MED ORDER — OXYCODONE HCL 5 MG PO TABS: 10.0000 mg | ORAL_TABLET | ORAL | Status: DC | PRN

## 2019-08-01 MED ORDER — COCONUT OIL OIL: 1.0000 "application " | TOPICAL_OIL | Status: DC | PRN

## 2019-08-01 MED ORDER — SIMETHICONE 80 MG PO CHEW: 80.0000 mg | CHEWABLE_TABLET | ORAL | Status: DC | PRN

## 2019-08-01 MED ORDER — ONDANSETRON HCL 4 MG PO TABS: 4.0000 mg | ORAL_TABLET | ORAL | Status: DC | PRN

## 2019-08-01 MED ORDER — ONDANSETRON HCL 4 MG/2ML IJ SOLN: 4.0000 mg | INTRAMUSCULAR | Status: DC | PRN

## 2019-08-01 MED ORDER — PRENATAL MULTIVITAMIN CH
1.0000 | ORAL_TABLET | Freq: Every day | ORAL | Status: DC
  Administered 2019-08-01 – 2019-08-02 (×2): 1 via ORAL
  Filled 2019-08-01 (×2): qty 1

## 2019-08-01 MED ORDER — OXYCODONE HCL 5 MG PO TABS: 5.0000 mg | ORAL_TABLET | ORAL | Status: DC | PRN

## 2019-08-01 MED ORDER — DIBUCAINE (PERIANAL) 1 % EX OINT: 1.0000 "application " | TOPICAL_OINTMENT | CUTANEOUS | Status: DC | PRN

## 2019-08-01 MED ORDER — ZOLPIDEM TARTRATE 5 MG PO TABS: 5.0000 mg | ORAL_TABLET | Freq: Every evening | ORAL | Status: DC | PRN

## 2019-08-01 MED ORDER — ACETAMINOPHEN 325 MG PO TABS
650.0000 mg | ORAL_TABLET | ORAL | Status: DC | PRN
  Administered 2019-08-01: 650 mg via ORAL
  Filled 2019-08-01: qty 2

## 2019-08-01 MED ORDER — WITCH HAZEL-GLYCERIN EX PADS: 1.0000 "application " | MEDICATED_PAD | CUTANEOUS | Status: DC | PRN

## 2019-08-01 MED ORDER — OXYTOCIN 10 UNIT/ML IJ SOLN
INTRAMUSCULAR | Status: AC
  Administered 2019-08-01: 10 [IU]
  Filled 2019-08-01: qty 1

## 2019-08-01 MED ORDER — TETANUS-DIPHTH-ACELL PERTUSSIS 5-2.5-18.5 LF-MCG/0.5 IM SUSP: 0.5000 mL | Freq: Once | INTRAMUSCULAR | Status: DC

## 2019-08-01 MED ORDER — BENZOCAINE-MENTHOL 20-0.5 % EX AERO
1.0000 "application " | INHALATION_SPRAY | CUTANEOUS | Status: DC | PRN
  Filled 2019-08-01: qty 56

## 2019-08-01 MED ORDER — IBUPROFEN 600 MG PO TABS
600.0000 mg | ORAL_TABLET | Freq: Four times a day (QID) | ORAL | Status: DC
  Administered 2019-08-01 – 2019-08-03 (×9): 600 mg via ORAL
  Filled 2019-08-01 (×9): qty 1

## 2019-08-01 MED ORDER — SENNOSIDES-DOCUSATE SODIUM 8.6-50 MG PO TABS
2.0000 | ORAL_TABLET | ORAL | Status: DC
  Administered 2019-08-01 – 2019-08-02 (×2): 2 via ORAL
  Filled 2019-08-01 (×2): qty 2

## 2019-08-01 NOTE — Lactation Note (Signed)
This note was copied from a baby's chart. Lactation Consultation Note Mom resting. Informed mom LC would back to see her today. Mom shook her head OK.  Patient Name: Stacey [name] CAREQ'J Date: 08/01/2019     Maternal Data    Feeding Feeding Type: Breast Fed  LATCH Score Latch: Grasps breast easily, tongue down, lips flanged, rhythmical sucking.  Audible Swallowing: Spontaneous and intermittent  Type of Nipple: Everted at rest and after stimulation  Comfort (Breast/Nipple): Soft / non-tender  Hold (Positioning): No assistance needed to correctly position infant at breast.  LATCH Score: 10  Interventions    Lactation Tools Discussed/Used     Consult Status      [facility]iana Sleep, Diamond Nickel 08/01/2019, 4:59 AM

## 2019-08-01 NOTE — Lactation Note (Signed)
This note was copied from a baby's chart. Lactation Consultation Note  Patient Name: Stacey [name] RJJOA'C Date: 08/01/2019 Reason for consult: Initial assessment;Early term 55-38.6wks  P2 mother whose infant is now 55 hours old.  This is an ETI at 38+4 weeks.  Mother breast fed her first child (now 34 years old) for 6 months.  Baby was swaddled and asleep in the bassinet when I arrived.  Mother had no immediate questions/concerns related to breast feeding.  Mother's breasts are soft and non tender and nipples are everted and intact.  Mother was able to demonstrate hand expression and colostrum drops noted.  Container provided and I encouraged practicing hand expression before/after feedings to help encourage milk supply.  Milk storage times reviewed and finger feeding demonstrated.  Reviewed feeding cues with mother and provided basic breast feeding education.  Mother does not have a DEBP for home use.  She is currently receiving Medicaid and I suggested she call the [facility] office today to set up an appointment for eligibiltiy.  Mother appreciative of this advice and will plan to do so.  Informed her that we will provide a manual pump at discharge.  Mom made aware of O/P services, breastfeeding support groups, community resources, and our phone # for post-discharge questions. No support person here at this time.  Mother will call for latch assistance as needed.   Maternal Data Formula Feeding for Exclusion: No Has patient been taught Hand Expression?: Yes Does the patient have breastfeeding experience prior to this delivery?: Yes  Feeding Feeding Type: Breast Fed  LATCH Score                   Interventions    Lactation Tools Discussed/Used WIC Program: No(Has Medicaid; suggested she call the [facility] office today for an appt)   Consult Status Consult Status: Follow-up Date: 08/02/19 Follow-up type: In-patient    Story Stacey [name] Stacey [name] 08/01/2019, 8:45 AM

## 2019-08-01 NOTE — Discharge Instructions (Signed)

## 2019-08-01 NOTE — Plan of Care (Signed)
  Problem: Pain Managment: Goal: General experience of comfort will improve Outcome: Progressing   Patient progressing well through morning shift. Cramping with breastfeeding, pain mgmt with heat packs motrin and tylenol. She has no other concerns.

## 2019-08-01 NOTE — Discharge Summary (Signed)
Postpartum Discharge Summary     Patient Name: Stacey [name] DOB: 04/16/1986 MRN: [id number]  Date of admission: 07/31/2019 Delivering Provider: Merilyn Baba   Date of discharge: 08/03/2019  Admitting diagnosis: Normal labor [O80, Z37.9] Intrauterine pregnancy: [redacted]w[redacted]d    Secondary diagnosis:  Principal Problem:   Normal labor Active Problems:   Gestational diabetes   GBS bacteriuria  Additional problems: None     Discharge diagnosis: Term Pregnancy Delivered                                                                                                Post partum procedures:none  Augmentation: None  Complications: None  Hospital course:  Onset of Labor With Vaginal Delivery     34y.o. yo G3P1011 at 36w4das admitted in Active Labor on 07/31/2019. Patient had an uncomplicated labor course as follows:   Patient admitted in active labor. She progressed to complete without augmentation, SROMed and delivered shortly after.  Membrane Rupture Time/Date: 1:00 AM ,08/01/2019   Intrapartum Procedures: Episiotomy: None [1]                                         Lacerations:  Periurethral [8]  Patient had a delivery of a Viable infant. 08/01/2019  Information for the patient's newborn:  FoAnnia, Gomm0[[id number]]Delivery Method: Vaginal, Spontaneous(Filed from Delivery Summary)     Pateint had an uncomplicated postpartum course.  She is ambulating, tolerating a regular diet, passing flatus, and urinating well. Patient is discharged home in stable condition on 08/03/19.  Delivery time: 1:11 AM    Magnesium Sulfate received: No BMZ received: No Rhophylac:N/A MMR:N/A Transfusion:No  Physical exam  Vitals:   08/02/19 1630 08/02/19 1941 08/02/19 2142 08/03/19 0527  BP: 116/85 122/67 113/65 122/67  Pulse: (!) 105 60 100 98  Resp: '20 18 16 15  ' Temp: (!) 97 F (36.1 C) 98.1 F (36.7 C) 98.1 F (36.7 C) 98 F (36.7 C)  TempSrc: Oral Oral Oral Oral  SpO2: 100% 100%  99% 100%  Weight:      Height:       General: alert, cooperative and no distress Lochia: appropriate Uterine Fundus: firm Incision: N/A DVT Evaluation: No evidence of DVT seen on physical exam. Labs: Lab Results  Component Value Date   WBC 10.3 08/01/2019   HGB 11.2 (L) 08/01/2019   HCT 35.2 (L) 08/01/2019   MCV 73.3 (L) 08/01/2019   PLT 290 08/01/2019   CMP Latest Ref Rng & Units 06/27/2017  Glucose 65 - 99 mg/dL 118(H)  BUN 6 - 20 mg/dL 7  Creatinine 0.44 - 1.00 mg/dL 0.91  Sodium 135 - 145 mmol/L 131(L)  Potassium 3.5 - 5.1 mmol/L 3.4(L)  Chloride 101 - 111 mmol/L 102  CO2 22 - 32 mmol/L 17(L)  Calcium 8.9 - 10.3 mg/dL 9.1  Total Protein 6.5 - 8.1 g/dL 7.9  Total Bilirubin 0.3 - 1.2 mg/dL 0.6  Alkaline Phos 38 - 126 U/L 371(H)  AST 15 - 41 U/L 56(H)  ALT 14 - 54 U/L 34   Edinburgh Score: Edinburgh Postnatal Depression Scale Screening Tool 08/02/2019  I have been able to laugh and see the funny side of things. 0  I have looked forward with enjoyment to things. 0  I have blamed myself unnecessarily when things went wrong. 0  I have been anxious or worried for no good reason. 0  I have felt scared or panicky for no good reason. 0  Things have been getting on top of me. 0  I have been so unhappy that I have had difficulty sleeping. 0  I have felt sad or miserable. 0  I have been so unhappy that I have been crying. 0  The thought of harming myself has occurred to me. 0  Edinburgh Postnatal Depression Scale Total 0    Discharge instruction: per After Visit Summary and "Baby and Me Booklet".  After visit meds:  Allergies as of 08/03/2019   No Known Allergies     Medication List    STOP taking these medications   Accu-Chek Guide test strip Generic drug: glucose blood   Accu-Chek Softclix Lancets lancets   Blood Pressure Kit Devi   clobetasol ointment 0.05 % Commonly known as: Temovate   metFORMIN 500 MG tablet Commonly known as: GLUCOPHAGE   ondansetron 8  MG disintegrating tablet Commonly known as: Zofran ODT     TAKE these medications   acetaminophen 325 MG tablet Commonly known as: TYLENOL Take 650 mg by mouth every 6 (six) hours as needed for moderate pain.   doxylamine (Sleep) 25 MG tablet Commonly known as: UNISOM Take 1 tablet (25 mg total) by mouth at bedtime as needed.   ibuprofen 600 MG tablet Commonly known as: ADVIL Take 1 tablet (600 mg total) by mouth every 6 (six) hours.   pantoprazole 40 MG tablet Commonly known as: Protonix Take 1 tablet (40 mg total) by mouth daily.   prenatal vitamin w/FE, FA 27-1 MG Tabs tablet Take 1 tablet by mouth daily at 12 noon.       Diet: routine diet  Activity: Advance as tolerated. Pelvic rest for 6 weeks.   Outpatient follow up:6 weeks Follow up Appt: Future Appointments  Date Time Provider [place]  09/08/2019  8:20 AM WOC-WOCA LAB WOC-WOCA WOC  09/08/2019  8:55 AM Jorje Guild, NP [place] WOC   Follow up Visit: [place] for [facility] Follow up.   Specialty: Obstetrics and Gynecology Why: On 09/08/19, as scheduled for 2 hour glucose test and postpartum visit.  Contact information: [address] [place], [place] [id number] [place] [address] [phone]         Please schedule this patient for Postpartum visit in: 4-6 weeks with the following provider: Any provider (in-person) For C/S patients schedule nurse incision check in weeks 2 weeks: no High risk pregnancy complicated by: GDM-A2 on metformin Delivery mode:  SVD Anticipated Birth Control:  other/unsure PP Procedures needed: 2 hour GTT  Schedule Integrated BH visit: no  Newborn Data: Live born female  Birth Weight:  3135g/6lb 14.6 oz APGAR: 9, 9  Newborn Delivery   Birth date/time: 08/01/2019 01:11:00 Delivery type: Vaginal, Spontaneous      Baby Feeding: Breast Disposition:home with mother   08/03/2019 Fatima Blank, CNM

## 2019-08-02 LAB — GLUCOSE, CAPILLARY: Glucose-Capillary: 75 mg/dL (ref 70–99)

## 2019-08-02 LAB — RPR: RPR Ser Ql: NONREACTIVE

## 2019-08-02 NOTE — Progress Notes (Signed)
Post Partum Day #1 Subjective: no complaints, up ad lib and tolerating PO; would like to go home today, but unlikely that infant will be d/c due to inadequate GBS ppx; breastfeeding going well; declines contraception  Objective: Blood pressure 125/61, pulse 93, temperature 98.6 F (37 C), temperature source Oral, resp. rate 16, height 5\' 6"  (1.676 m), weight 89.9 kg, last menstrual period 11/04/2018, SpO2 100 %, unknown if currently breastfeeding.  Physical Exam:  General: alert, cooperative and no distress Lochia: appropriate Uterine Fundus: firm DVT Evaluation: No evidence of DVT seen on physical exam.  Recent Labs    07/31/19 2354 08/01/19 0344  HGB 11.5* 11.2*  HCT 35.6* 35.2*    Assessment/Plan: Plan for discharge tomorrow; potentially later today if infant is discharged   LOS: 2 days   10/01/19 CNM 08/02/2019, 8:30 AM

## 2019-08-02 NOTE — Lactation Note (Signed)
This note was copied from a baby's chart. Lactation Consultation Note  Patient Name: Stacey [name] [id number] Date: 08/02/2019 Reason for consult: Follow-up assessment;Infant weight loss;Early term 37-38.6wks P2, 28 hour female infant. Per mom, infant is starting to latch well at breast, most feedings are now 30 minutes, she has been supplementing with 12 to 15 mls of formula afterwards due infant having low sugar levels.  LC did not observe latch at this time infant asleep in basinet. Per mom, she prefers to give infant her EBM instead of formula,  but chose formula instead of donor milk as a supplement until her milk supply is established. Mom reviewed hand expression but only a small amount of colostrum was expressed.  LC discussed with mom using DEBP to help with breast stimulation and establishing milk supply, mom will pump every 3 hours for 15 minutes on initial setting and will hand express after pumping. Mom shown how to use DEBP & how to disassemble, clean, & reassemble parts. Mom knows to give infant back any EBM that is hand express or pumped after latching infant at breast before offering formula, mom has breastfeeding supplemental sheet based on infant's age/ hours of life when breastfeeding. Mom will continue to do as much STS as possible.  Mom will breast feed infant first  then afterwards offer EBM and / or formula. Mom knows to call RN or LC if she has questions, concerns or need assistance with latching infant at breast.    Maternal Data    Feeding Feeding Type: Bottle Fed - Formula Nipple Type: Slow - flow  LATCH Score                   Interventions    Lactation Tools Discussed/Used     Consult Status Consult Status: Follow-up Date: 08/02/19 Follow-up type: In-patient    Danelle Earthly 08/02/2019, 6:10 AM

## 2019-08-03 ENCOUNTER — Encounter (HOSPITAL_COMMUNITY): Payer: Self-pay | Admitting: Family Medicine

## 2019-08-03 MED ORDER — IBUPROFEN 600 MG PO TABS: 600.0000 mg | ORAL_TABLET | Freq: Four times a day (QID) | ORAL | 0 refills | Status: DC

## 2019-08-03 NOTE — Lactation Note (Signed)
This note was copied from a baby's chart. Lactation Consultation Note  Patient Name: Stacey [name] Date: 08/03/2019  P2, 49 hour  ETI female infant, weight loss -3%. Mom feels breastfeeding is going well and she has no concerns at this time. Per mom, infant has been cluster feeding and most feeding he is breastfeeding for 30 minutes.  Per mom, infant last breastfeed at 0210am for 30 minutes prior to [facility] entering room, LC did not observe latch at this time.  Mom is not supplementing with formula temporarily gave formula due to infant have low blood sugars first few hours of life. Per mom, she has used DEBP twice but not as much due infant cluster feeding. Mom will continue to breastfeed infant on demand according hunger cues, 8 to 12 times within 24 hours and not exceed 3 hours without breastfeeding infant. Mom knows to call RN or LC if she has any breastfeeding questions, concerns or need assistance with latching infant at breast.      Maternal Data    Feeding Feeding Type: Breast Fed  LATCH Score                   Interventions    Lactation Tools Discussed/Used     Consult Status      Danelle Earthly 08/03/2019, 2:52 AM

## 2019-08-03 NOTE — Lactation Note (Signed)
This note was copied from a baby's chart. Lactation Consultation Note  Patient Name: Stacey [name] [id number] Date: 08/03/2019 Reason for consult: Follow-up assessment Mom states baby has been cluster feeding.  Mom is currently pumping with DEBP and has obtained 10-20 mls from each breast.  Breasts are feeling fuller.  Discussed milk coming to volume and the prevention and treatment of engorgement.  Manual pump given with instructions.  Mom denies questions or concerns. Reviewed outpatient services reviewed and encouraged to call prn.  Maternal Data    Feeding    LATCH Score                   Interventions    Lactation Tools Discussed/Used     Consult Status Consult Status: Complete Follow-up type: Call as needed    Huston Foley 08/03/2019, 9:14 AM

## 2019-08-04 ENCOUNTER — Other Ambulatory Visit (HOSPITAL_COMMUNITY): Admission: RE | Admit: 2019-08-04 | Payer: Medicaid Other | Source: Ambulatory Visit

## 2019-08-06 ENCOUNTER — Inpatient Hospital Stay (HOSPITAL_COMMUNITY)
Admission: AD | Admit: 2019-08-06 | Payer: Medicaid Other | Source: Home / Self Care | Admitting: Obstetrics and Gynecology

## 2019-08-06 ENCOUNTER — Inpatient Hospital Stay (HOSPITAL_COMMUNITY): Payer: Medicaid Other

## 2019-09-05 ENCOUNTER — Other Ambulatory Visit: Payer: Self-pay

## 2019-09-05 ENCOUNTER — Encounter: Payer: Self-pay | Admitting: Cardiovascular Disease

## 2019-09-05 ENCOUNTER — Ambulatory Visit (INDEPENDENT_AMBULATORY_CARE_PROVIDER_SITE_OTHER): Payer: Medicaid Other | Admitting: Cardiovascular Disease

## 2019-09-05 VITALS — BP 110/70 | HR 71 | Ht 66.0 in | Wt 180.0 lb

## 2019-09-05 DIAGNOSIS — R000 Tachycardia, unspecified: Secondary | ICD-10-CM | POA: Insufficient documentation

## 2019-09-05 DIAGNOSIS — R0989 Other specified symptoms and signs involving the circulatory and respiratory systems: Secondary | ICD-10-CM

## 2019-09-05 DIAGNOSIS — Z13220 Encounter for screening for lipoid disorders: Secondary | ICD-10-CM

## 2019-09-05 HISTORY — DX: Tachycardia, unspecified: R00.0

## 2019-09-05 HISTORY — DX: Other specified symptoms and signs involving the circulatory and respiratory systems: R09.89

## 2019-09-05 NOTE — Progress Notes (Signed)
09/05/2019 Stacey [name]   04-Dec-1985  [id number]  Primary Physician Patient, No Pcp Per Primary Cardiologist: Runell Gess MD Nicholes [place], [place]  HPI:  Stacey [name] is a 34 y.o. thin appearing married African-American female mother of 2 children (newborn 68 weeks old) who was referred by her OB/GYN for increased heart rate during the third trimester of her recent pregnancy.  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  She has no cardiac risk factors.  She is never had a heart attack or stroke.  She denies chest pain or shortness of breath.  She says her heart rate during her third trimester of her recent pregnancy was in the 130 range.  Now is back to normal.  She is asymptomatic from this.   Current Meds  Medication Sig  . acetaminophen (TYLENOL) 325 MG tablet Take 650 mg by mouth every 6 (six) hours as needed for moderate pain.  . prenatal vitamin w/FE, FA (PRENATAL 1 + 1) 27-1 MG TABS tablet Take 1 tablet by mouth daily at 12 noon.     No Known Allergies  Social History   Socioeconomic History  . Marital status: Single    Spouse name: Not on file  . Number of children: Not on file  . Years of education: Not on file  . Highest education level: Not on file  Occupational History  . Not on file  Tobacco Use  . Smoking status: Never Smoker  . Smokeless tobacco: Never Used  Substance and Sexual Activity  . Alcohol use: No  . Drug use: No  . Sexual activity: Yes    Partners: Male    Birth control/protection: None  Other Topics Concern  . Not on file  Social History Narrative  . Not on file   Social Determinants of Health   Financial Resource Strain:   . Difficulty of Paying Living Expenses:   Food Insecurity: No Food Insecurity  . Worried About Programme researcher, broadcasting/film/video in the Last Year: Never true  . Ran Out of Food in the Last Year: Never true  Transportation Needs: No Transportation Needs  . Lack of Transportation (Medical): No  . Lack of Transportation  (Non-Medical): No  Physical Activity:   . Days of Exercise per Week:   . Minutes of Exercise per Session:   Stress:   . Feeling of Stress :   Social Connections:   . Frequency of Communication with Friends and Family:   . Frequency of Social Gatherings with Friends and Family:   . Attends Religious Services:   . Active Member of Clubs or Organizations:   . Attends Banker Meetings:   Marland Kitchen Marital Status:   Intimate Partner Violence:   . Fear of Current or Ex-Partner:   . Emotionally Abused:   Marland Kitchen Physically Abused:   . Sexually Abused:      Review of Systems: General: negative for chills, fever, night sweats or weight changes.  Cardiovascular: negative for chest pain, dyspnea on exertion, edema, orthopnea, palpitations, paroxysmal nocturnal dyspnea or shortness of breath Dermatological: negative for rash Respiratory: negative for cough or wheezing Urologic: negative for hematuria Abdominal: negative for nausea, vomiting, diarrhea, bright red blood per rectum, melena, or hematemesis Neurologic: negative for visual changes, syncope, or dizziness All other systems reviewed and are otherwise negative except as noted above.    Blood pressure 110/70, pulse 71, height 5\' 6"  (1.676 m), weight 180 lb (81.6 kg), last menstrual period 11/04/2018, SpO2 98 %,  unknown if currently breastfeeding.  General appearance: alert and no distress Neck: no adenopathy, no JVD, supple, symmetrical, trachea midline, thyroid not enlarged, symmetric, no tenderness/mass/nodules and Left carotid bruit Lungs: clear to auscultation bilaterally Heart: regular rate and rhythm, S1, S2 normal, no murmur, click, rub or gallop Extremities: extremities normal, atraumatic, no cyanosis or edema Pulses: 2+ and symmetric Skin: Skin color, texture, turgor normal. No rashes or lesions Neurologic: Alert and oriented X 3, normal strength and tone. Normal symmetric reflexes. Normal coordination and gait  EKG sinus  rhythm 71 with incomplete right bundle branch block.  I personally reviewed this EKG.  ASSESSMENT AND PLAN:   Left carotid bruit Left carotid bruit on exam today we will obtain carotid Doppler studies.  Increased heart rate Patient was referred by her OB/GYN for increased heart rate during her third trimester of her recent pregnancy.  She did not have this issue with her first pregnancy.  Her heart rate essentially come back to normal and she is asymptomatic.  No further work-up is required      Lorretta Harp MD [facility], [facility] 09/05/2019 3:36 PM

## 2019-09-05 NOTE — Assessment & Plan Note (Signed)
Left carotid bruit on exam today we will obtain carotid Doppler studies.

## 2019-09-05 NOTE — Patient Instructions (Signed)
Medication Instructions:  Your physician recommends that you continue on your current medications as directed. Please refer to the Current Medication list given to you today.  Lab Work: Return for [employer] (Lipid panel)  Testing/Procedures: Your physician has requested that you have a carotid duplex. This test is an ultrasound of the carotid arteries in your neck. It looks at blood flow through these arteries that supply the brain with blood. Allow one hour for this exam. There are no restrictions or special instructions.  Follow-Up: At [facility], you and your health needs are our priority.  As part of our continuing mission to provide you with exceptional heart care, we have created designated Provider Care Teams.  These Care Teams include your primary Cardiologist (physician) and Advanced Practice Providers (APPs -  Physician Assistants and Nurse Practitioners) who all work together to provide you with the care you need, when you need it.  We recommend signing up for the patient portal called "MyChart".  Sign up information is provided on this After Visit Summary.  MyChart is used to connect with patients for Virtual Visits (Telemedicine).  Patients are able to view lab/test results, encounter notes, upcoming appointments, etc.  Non-urgent messages can be sent to your provider as well.   To learn more about what you can do with MyChart, go to [email or web address].    Your next appointment:    AS NEEDED WITH DR. Allyson Sabal

## 2019-09-05 NOTE — Assessment & Plan Note (Signed)
Patient was referred by her OB/GYN for increased heart rate during her third trimester of her recent pregnancy.  She did not have this issue with her first pregnancy.  Her heart rate essentially come back to normal and she is asymptomatic.  No further work-up is required

## 2019-09-08 ENCOUNTER — Ambulatory Visit (INDEPENDENT_AMBULATORY_CARE_PROVIDER_SITE_OTHER): Payer: Medicaid Other | Admitting: Student

## 2019-09-08 ENCOUNTER — Other Ambulatory Visit: Payer: Self-pay

## 2019-09-08 ENCOUNTER — Other Ambulatory Visit: Payer: Medicaid Other

## 2019-09-08 ENCOUNTER — Encounter: Payer: Self-pay | Admitting: Student

## 2019-09-08 DIAGNOSIS — O24415 Gestational diabetes mellitus in pregnancy, controlled by oral hypoglycemic drugs: Secondary | ICD-10-CM

## 2019-09-08 NOTE — Progress Notes (Signed)
Subjective:     Stacey [name] is a 34 y.o. female who presents for a postpartum visit. She is 5 weeks postpartum following a SVD. I have fully reviewed the prenatal and intrapartum course. The delivery was at [redacted]w[redacted]d gestational weeks. Outcome: spontaneous vaginal delivery. Anesthesia: none. Postpartum course has been unremarkable. Baby's course has been unremarkable. Baby is feeding by breast. Bleeding no bleeding. Bowel function is normal. Bladder function is normal. Patient is not sexually active. Contraception method is none. Postpartum depression screening:Negative.  The following portions of the patient's history were reviewed and updated as appropriate: allergies, current medications, past family history, past medical history, past social history, past surgical history and problem list.  Review of Systems Pertinent items are noted in HPI.   Objective:    LMP 11/04/2018   General:  alert, cooperative and appears stated age  Lungs: clear to auscultation bilaterally  Heart:  regular rate and rhythm, S1, S2 normal, no murmur, click, rub or gallop  Abdomen: soft, non-tender; bowel sounds normal; no masses,  no organomegaly   Vulva:  not evaluated  Vagina: not evaluated        Assessment:     Normal postpartum exam. Pap smear not done at today's visit.   Plan:   1. Encounter for routine postpartum follow-up -patient doing well with no complaints. Encouraged to establish care with PCP, given list of providers but instructed to call her medicaid case worker first to see if she's already assigned to a practice  -needs pap smear & PP gtt. States she didn't realize how long the visit would be this morning & didn't have a babysitter lined up for an extended time. Needs to go back home & states she will come back later this week for pap and fasting labs  Judeth Horn, NP

## 2019-09-08 NOTE — Patient Instructions (Addendum)
AREA PEDIATRIC/FAMILY PRACTICE PHYSICIANS  Central/Southeast [place] ([id number]) . [facility] Melodie Bouillon, MD; Lum Babe, MD; Sheffield Slider, MD; Leveda Anna, MD; McDiarmid, MD; Jerene Bears, MD; Jennette Kettle, MD; Gwendolyn Grant, MD o [address] [place], [place] [address] o [phone] o Mon-Fri 8:30-12:30, 1:30-5:00 o Providers come to see babies at [facility] o Accepting Medicaid . [facility] o Limited providers who accept newborns: Docia Chuck, MD; Kateri Plummer, MD; Paulino Rily, MD o [address], [place], [place] [address] o [phone] o Mon-Fri 8:00-5:30 o Babies seen by providers at [facility] o Does NOT accept Medicaid o Please call early in hospitalization for appointment (limited availability)  . [facility] Fatima Sanger, MD o [address], [place], [place] [address] o [phone] o Mon, Tue, Thur, Fri 8:30-5:00, Wed 10:00-7:00 (closed 1-2pm) o Babies seen by [facility] providers o Accepting Medicaid . Donnie Coffin - Pediatrician Fae Pippin, MD o [address], [place], [place] [address] o [phone] o Mon-Fri 8:30-5:00, Sat 8:30-12:00 o Provider comes to see babies at [facility] o Accepting Medicaid o Must have been referred from current patients or contacted office prior to delivery . [facility]) Leotis Pain, MD; Ave Filter, MD; Luna Fuse, MD; Kennedy Bucker, MD; Konrad Dolores, MD; Kathlene November, MD; Jenne Campus, MD; Lubertha South, MD; Wynetta Emery, MD; Duffy Rhody, MD; Gerre Couch, NP; Shirl Harris, NP o [address], [place], [place] [address] o [phone] o Mon, Tue, Thur, Fri 8:30-5:30, Wed 9:30-5:30, Sat 8:30-12:30 o Babies seen by [facility] providers o Accepting Medicaid o Only accepting infants of first-time parents or siblings of current patients [facility] discharge coordinator will make follow-up appointment . Cyril Mourning o [address] [place], [place]  [address] o [phone]   Fax - [phone] . [facility] o [address], [place], [place]  [address] o Phone - [phone]   Fax - [phone] . Lucio Edward o [address], [place], [place]  [address] o [phone]  [place] 7 [phone]) . [facility] Jorge Mandril, MD; Alita Chyle, MD; Princella Ion, MD; MD; Earlene Plater, MD; Jamesetta Orleans, MD; Alvera Novel, MD; Clarene Duke, MD; Rana Snare, MD; Carmon Ginsberg, MD; Alinda Money, MD; Hosie Poisson, MD; Mayford Knife, MD o [address], [place], [place] [address] o [phone] o Mon-Fri 8:30-5:00 (extended evenings Mon-Thur as needed), Sat-Sun 10:00-1:00 o Providers come to see babies at [facility] o Accepting Medicaid for families of first-time babies and families with all children in the household age 72 and under. Must register with office prior to making appointment (M-F only). [facility] Odella Aquas, NP; Lynelle Doctor, MD; Susann Givens, MD; [place], [place] o [address], [place], [place] [address] o [phone] o Mon-Fri 8:00-5:00 o Babies seen by providers at [facility] o Does NOT accept Medicaid/Commercial Insurance Only . [facility] [place] [facility])  Suzette Battiest, MD; Zachery Dauer, MD; Stefan Church, MD; Sabino Dick, MD; Quitman Livings, MD; Farris Has, MD; Gaynell Face, MD; Betha Loa, MD; Colon Flattery, MD; Clifton James, MD o [address] [place], [place] [address] o [phone] o Mon-Fri 8:30-5:30, Sat (Oct.-Mar.) 9:00-1:00 o Babies seen by providers at [facility] o Accepting [facility] [phone]) . [facility] Dimitri, MD; Sheliah Hatch, MD o [address], [place], [place] [address] o [phone] o Mon-Fri 8:30-5:00, Sat 8:30-12:00 o Providers come to see babies at [facility] o Does NOT accept Medicaid . [facility] Cindy Hazy, PA; Tracie Harrier, MD; [place], [place]; Wynelle Link, MD; Azucena Cecil, MD o [address]  [place], [place] [address] o [phone] o Mon-Fri 8:00-5:00 o Babies seen by providers at [facility] o Does NOT accept Medicaid o Only accepting babies of parents who are patients o Please call early in hospitalization for appointment (limited availability) . [facility] Lamar Benes, MD; Abran Cantor, MD; Early Osmond, MD; Cherre Huger, NP; Hyacinth Meeker, MD; Dwan Bolt, MD; Jarold Motto, NP; Dario Guardian, MD; Talmage Nap, MD; Maisie Fus, MD; Pricilla Holm, MD; Tama High, MD o [address] [place], [place], [place] [address] o [phone] o Mon-Fri 8:00-5:00, Sat 9:00-12:00 o Providers come to see babies at [facility] o Does NOT accept [facility] [phone]) . [facility] o Limited providers accepting new patients: Drema Pry, NP; [place], PA o [address] [place], [place] [address] o [phone] o Mon-Fri 8:00-5:00 o Babies seen by providers at [facility] o Does NOT accept Medicaid o Only accepting babies of parents who are patients o Please call early in hospitalization for appointment (limited availability) . [facility] Luan Pulling, MD; Nash Dimmer, MD o [address] [place], [place] [address] o [phone] (press 1 to schedule appointment) o Mon-Fri 8:00-5:00 o Providers come to see babies at [facility] o Does NOT accept Medicaid . [facility] Cristino Martes, MD o [address], [place], [place] [address] o [phone] o Mon-Fri 8:30-5:00 (lunch 12:30-1:00), extended hours by appointment only Wed 5:00-6:30 o Babies seen by [facility] providers o Accepting Medicaid . [facility] G[facility]vere Abbot, MD; [place], MD; Hassan Rowan, MD o [address] [place], [place], [place] [address] o [phone] o Mon-Fri 8:00-5:00 o Babies seen by [facility] providers o Does NOT accept Medicaid . Nature conservation officer at [place] [address] Elsworth Soho, MD; Durene Cal, MD; [place], DO o [address] [place], [place] [address] o  [phone] o Mon-Fri 8:00-5:00 o Babies seen by [facility] providers o Does NOT accept Medicaid . [facility] o [place], [place]; [place], [place]; [place], NP; Avis Epley, MD; Vonna Kotyk, MD; Clance Boll, MD; Stevphen Rochester, NP; Arvilla Market, NP; Ann Maki, NP; Otis Dials, NP; Vaughan Basta, MD; Hurley, MD o [address] [place], [place] [address] o [phone] o Mon-Fri 8:30-5:00, Sat 10:00-1:00 o Providers come to see babies at [facility] o Does NOT accept Medicaid o Free prenatal information session Tuesdays at 4:45pm . [facility] Luna Kitchens, MD; [place], [place]; [place], [place]; [place], [place] o [address] [place] [address] o [phone] o Mon-Fri 7:30-5:30 o Babies seen by [facility] providers . [facility] o [address], [place], [place]  [address] o [phone]   Fax - [phone]  [place] [phone] & [phone]) . [facility] Alphonsa Overall, MD o [address] [place], [place] [address] o [phone] o Mon-Thur 8:00-6:00 o Providers come to see babies at [facility] o Accepting Medicaid . [facility] Zenon Mayo, NP; Cyndia Bent, MD; [place], [place]; [place], [place] o [address] [place], [place] [address] o [phone] o Mon-Thur 7:30-7:30, Fri 7:30-4:30 o Babies seen by [facility] providers o Accepting Medicaid . [facility] Cheryle Horsfall, MD; Janene Harvey, NP; Vonita Moss, MD o [address], [place], [place] [address] o [phone] o Mon-Fri 8:30-5:00, Sat 8:30-12:00 o Providers come to see babies at [facility] o Accepting Medicaid o Must have "Meet & Greet" appointment at office prior to delivery . [facility] - [facility]in[facility] Otto [facility] [place]) o  Marlow Baars, MD; Earlene Plater, MD; Lucretia Roers, MD o [address], [place], [place] [address] o [phone] o Mon-Wed 8:00-6:00, Thur-Fri 8:00-5:00, Sat 9:00-12:00 o Providers come to see  babies at [facility] o Does NOT accept Medicaid o Only accepting siblings of current patients . [facility]  o [address], [place], [place]  [address] o [phone]   Fax - [phone] . [facility] at [facility] o [phone] [address], [place], [place]  [address] o [phone]   Fax - [phone]  [place] [phone] & [phone]) . Nature conservation officer at [facility] o [place], DO; [place], DO o [address] [place], [place] [address] o [phone] o Mon-Fri 7:00-5:00 o Babies seen by [facility] providers o Does NOT accept Medicaid . [facility] Ellis Savage, MD; [place], [place]; [place], [place] o [address], [place], [place] [address] o [phone] o Mon-Fri 8:00-5:00 o Babies seen by [facility] providers o Accepting Medicaid . [facility] - [address] Franne Forts, MD; [place], [place]; [place], NP; [place], [place] o [address] [place], [place], [place] [address] o [phone] o Mon-Fri 8:00-5:00 o Babies seen by providers at [facility] o Accepting [facility] [place] [phone]) . [facility] [place], [place] o [address] [place], [place] [address] o [phone] o Mon-Fri 8:00-5:00 o Babies seen by [facility] providers o Does NOT accept Medicaid o Limited availability, please call early in hospitalization to schedule follow-up . [facility] Jolee Ewing, PA; Eddie Candle, MD; [employer], MD; [place], [place]; Constance Goltz, MD; [place], [place] o [address] [facility] [address], [place], [place] [address] o [phone] o Mon-Fri 8:30-5:00, Sat 9:00-12:00 o Babies seen by providers at [facility] o Accepting Medicaid o Please register online then schedule online or call office o [email or web address] . [facility] - [facility] at [facility]) Samuella Bruin, NP; Lucianne Muss, MD; Lanier Clam, PA o [address], [place], [place] [address] o [phone] o Mon-Fri 8:00-5:00 o Babies seen by providers at [facility] o Accepting Medicaid . [facility] ([facility]) Sharin Mons, MD; Reed Breech, NP; Shelva Majestic, MD o [address], [place], [place] [address] o [phone] o Mon-Fri 8:00-5:30, Sat&Sun by appointment (phones open at 8:30) o Babies seen by [facility] providers o Accepting Medicaid o Must be a first-time baby or sibling of current patient . [facility] [address], [place], [place]  [address] o [phone]   Fax - [phone]  [place] [phone] & [phone]) . [facility] o [place], [place]; [place], [place]; Dimple Casey, MD; [place], [place]; Carolyne Fiscal, MD o [address], [place], [place] [address] o [phone] o Mon-Thur 8:00-7:00, Fri 8:00-5:00, Sat 8:00-12:00, Sun 9:00-12:00 o Babies seen by [facility] providers o Accepting Medicaid . [facility] at [facility], MD; Gaynell Face, MD; [facility], MD o [address], [place], [place] [address] o [phone] o Mon-Thur 8:00-5:00 o Babies seen by providers at [facility] o Accepting Medicaid . [facility] at [place] Gwenlyn Saran, MD; Coe[place]Goins, MD; Madilyn Fireman, MD; Melvyn Neth, MD; List, MD; Lazarus Salines, MD; Gaynell Face, MD; Berneda Rose, MD; Flora Lipps, MD; Beryl Meager, MD; Luther Redo, MD; Lavonia Drafts, MD; Kellie Simmering, MD o [address] [place] [place],  [place] [id number] o [phone] o Mon-Fri 8:00-5:30, Sat (Oct.-Mar.) 9:00-1:00 o Babies seen by providers at [facility] o Accepting Medicaid o Must fill out new patient packet, available online at [email or web address] . [facility] [address] [facility]) Simone Curiao Friddle, NP; Tiburcio PeaHarris, NP; Tresa EndoKelly, NP; Whitney PostLogan, MD; [place], [place];  Hennie DuosPoth, MD; Wynne Dustamadoss, MD; Kavin LeechStanton, NP o [address], [place], [place] [address] o [phone] o Mon-Thur 8:00-5:30, Fri 8:00-5:00 o Babies seen by providers at [facility] o Accepting [facility]  [place] [phone]) . [facility] o [place], [place]; [place], MD; Tanya NonesPickard, MD; [place], [place] o [address], [place], [place] [address] o [phone] o Mon-Fri 8:00-5:00 o Babies seen by providers at [facility] o Accepting [facility]   [place] [phone]) . [facility] o Cobb IslandMasneri, DO; Lenise ArenaMeyers, MD; RandallNelson, [place] o [address], [place], [place] [address] o [phone] o Mon-Fri 8:00-5:00 o Babies seen by providers at [facility] o Does NOT accept Medicaid o Limited appointment availability, please call early in hospitalization  . Nature conservation officerLeBauer HealthCare at [facility] o MincoKunedd, DO; Gratiot[place]Gowen, MD o [address], [place], [place] [address] o [phone] o Mon-Fri 8:00-5:00 o Babies seen by [facility] providers o Does NOT accept Medicaid . [facility] Lorrine Kino Cameron, MD; Ninetta LightsMacDonald, MD; CeciliaMichaels, [place]; Pine[place]ayak, MD o [address], [place], [place] [address] o [phone] o Mon-Fri 8:00-5:00 o After hours clinic [facility]([address] [place], [place] [address]) [phone] Mon-Fri 5:00-8:00, Sat 12:00-6:00, Sun 10:00-4:00 o Babies seen by [facility] providers o Accepting Medicaid . [facility] o [address], [place], [place]  [address] o [phone]   Fax - [phone]  [place] [phone]) . Nature conservation officerLeBauer HealthCare at [facility] o Andy, MD o [address], [place], [place] [address] o [phone] o Mon-Fri 8:00-5:00 o Babies seen by [facility] providers o Does NOT accept Medicaid . [facility] - [place] [facility] [place]) Tomi Likenso Eksir, MD o [address], [place], [place] [address] o [phone] o  Mon-Thur 8:00-7:00, Fri 8:00-5:00, Sat 8:00-12:00 o Babies seen by providers at [facility] o Accepting Medicaid - but does not have vaccinations in office (must be received elsewhere) o Limited availability, please call early in hospitalization  [place] ([phone]) . [facility]  o Wyvonne Lenzharlene Flemming, MD o [address] [place] [address], [place] [address] o [phone]  Fax [phone]        Health Maintenance, Female Adopting a healthy lifestyle and getting preventive care are important in promoting health and wellness. Ask your health care provider about:  The right schedule for you to have regular tests and exams.  Things you can do on your own to prevent diseases and keep yourself healthy. What should I know about diet, weight, and exercise? Eat a healthy diet   Eat a diet that includes plenty of vegetables, fruits, low-fat dairy products, and lean protein.  Do not eat a lot of foods that are high in solid fats, added sugars, or sodium. Maintain a healthy weight Body mass index (BMI) is used to identify weight problems. It estimates body fat based on height and weight. Your health care provider can help determine your BMI and help you achieve or maintain a healthy weight. Get regular exercise Get regular exercise. This is one of the most important things you  can do for your health. Most adults should:  Exercise for at least 150 minutes each week. The exercise should increase your heart rate and make you sweat (moderate-intensity exercise).  Do strengthening exercises at least twice a week. This is in addition to the moderate-intensity exercise.  Spend less time sitting. Even light physical activity can be beneficial. Watch cholesterol and blood lipids Have your blood tested for lipids and cholesterol at 34 years of age, then have this test every 5 years. Have your cholesterol levels checked more often if:  Your lipid or cholesterol levels are high.  You are  older than 34 years of age.  You are at high risk for heart disease. What should I know about cancer screening? Depending on your health history and family history, you may need to have cancer screening at various ages. This may include screening for:  Breast cancer.  Cervical cancer.  Colorectal cancer.  Skin cancer.  Lung cancer. What should I know about heart disease, diabetes, and high blood pressure? Blood pressure and heart disease  High blood pressure causes heart disease and increases the risk of stroke. This is more likely to develop in people who have high blood pressure readings, are of African descent, or are overweight.  Have your blood pressure checked: ? Every 3-5 years if you are 27-58 years of age. ? Every year if you are 74 years old or older. Diabetes Have regular diabetes screenings. This checks your fasting blood sugar level. Have the screening done:  Once every three years after age 28 if you are at a normal weight and have a low risk for diabetes.  More often and at a younger age if you are overweight or have a high risk for diabetes. What should I know about preventing infection? Hepatitis B If you have a higher risk for hepatitis B, you should be screened for this virus. Talk with your health care provider to find out if you are at risk for hepatitis B infection. Hepatitis C Testing is recommended for:  Everyone born from [birth year] through 1965.  Anyone with known risk factors for hepatitis C. Sexually transmitted infections (STIs)  Get screened for STIs, including gonorrhea and chlamydia, if: ? You are sexually active and are younger than 34 years of age. ? You are older than 34 years of age and your health care provider tells you that you are at risk for this type of infection. ? Your sexual activity has changed since you were last screened, and you are at increased risk for chlamydia or gonorrhea. Ask your health care provider if you are at risk.   Ask your health care provider about whether you are at high risk for HIV. Your health care provider may recommend a prescription medicine to help prevent HIV infection. If you choose to take medicine to prevent HIV, you should first get tested for HIV. You should then be tested every 3 months for as long as you are taking the medicine. Pregnancy  If you are about to stop having your period (premenopausal) and you may become pregnant, seek counseling before you get pregnant.  Take 400 to 800 micrograms (mcg) of folic acid every day if you become pregnant.  Ask for birth control (contraception) if you want to prevent pregnancy. Osteoporosis and menopause Osteoporosis is a disease in which the bones lose minerals and strength with aging. This can result in bone fractures. If you are 34 years old or older, or if you are at risk  for osteoporosis and fractures, ask your health care provider if you should:  Be screened for bone loss.  Take a calcium or vitamin D supplement to lower your risk of fractures.  Be given hormone replacement therapy (HRT) to treat symptoms of menopause. Follow these instructions at home: Lifestyle  Do not use any products that contain nicotine or tobacco, such as cigarettes, e-cigarettes, and chewing tobacco. If you need help quitting, ask your health care provider.  Do not use street drugs.  Do not share needles.  Ask your health care provider for help if you need support or information about quitting drugs. Alcohol use  Do not drink alcohol if: ? Your health care provider tells you not to drink. ? You are pregnant, may be pregnant, or are planning to become pregnant.  If you drink alcohol: ? Limit how much you use to 0-1 drink a day. ? Limit intake if you are breastfeeding.  Be aware of how much alcohol is in your drink. In the [place] one drink equals one 12 oz bottle of beer (355 mL), one 5 oz glass of wine (148 mL), or one 1 oz glass of hard liquor (44 mL).  General instructions  Schedule regular health, dental, and eye exams.  Stay current with your vaccines.  Tell your health care provider if: ? You often feel depressed. ? You have ever been abused or do not feel safe at home. Summary  Adopting a healthy lifestyle and getting preventive care are important in promoting health and wellness.  Follow your health care provider's instructions about healthy diet, exercising, and getting tested or screened for diseases.  Follow your health care provider's instructions on monitoring your cholesterol and blood pressure. This information is not intended to replace advice given to you by your health care provider. Make sure you discuss any questions you have with your health care provider. Document Revised: 05/08/2018 Document Reviewed: 05/08/2018 Elsevier Patient Education  2020 [employer].

## 2019-09-11 ENCOUNTER — Other Ambulatory Visit: Payer: Medicaid Other

## 2019-09-11 ENCOUNTER — Other Ambulatory Visit: Payer: Self-pay

## 2019-09-11 ENCOUNTER — Other Ambulatory Visit: Payer: Self-pay | Admitting: Student

## 2019-09-11 LAB — LIPID PANEL
Chol/HDL Ratio: 3.1 ratio (ref 0.0–4.4)
Cholesterol, Total: 152 mg/dL (ref 100–199)
HDL: 49 mg/dL (ref >39)
LDL Chol Calc (NIH): 92 mg/dL (ref 0–99)
Triglycerides: 53 mg/dL (ref 0–149)
VLDL Cholesterol Cal: 11 mg/dL (ref 5–40)

## 2019-09-12 LAB — GLUCOSE TOLERANCE, 2 HOURS
Glucose, 2 hour: 109 mg/dL (ref 65–139)
Glucose, GTT - Fasting: 82 mg/dL (ref 65–99)

## 2019-09-19 ENCOUNTER — Ambulatory Visit (HOSPITAL_COMMUNITY)
Admission: RE | Admit: 2019-09-19 | Discharge: 2019-09-19 | Disposition: A | Discharge status: Home / Self Care | Payer: Medicaid Other | Source: Ambulatory Visit | Attending: Cardiovascular Disease | Admitting: Cardiovascular Disease

## 2019-09-19 ENCOUNTER — Other Ambulatory Visit: Payer: Self-pay

## 2019-09-19 DIAGNOSIS — R0989 Other specified symptoms and signs involving the circulatory and respiratory systems: Secondary | ICD-10-CM | POA: Diagnosis present

## 2019-10-07 ENCOUNTER — Other Ambulatory Visit (HOSPITAL_COMMUNITY)
Admission: RE | Admit: 2019-10-07 | Discharge: 2019-10-07 | Disposition: A | Discharge status: Home / Self Care | Payer: Medicaid Other | Source: Ambulatory Visit | Attending: Student | Admitting: Student

## 2019-10-07 ENCOUNTER — Encounter: Payer: Self-pay | Admitting: Student

## 2019-10-07 ENCOUNTER — Ambulatory Visit (INDEPENDENT_AMBULATORY_CARE_PROVIDER_SITE_OTHER): Payer: Medicaid Other | Admitting: Student

## 2019-10-07 ENCOUNTER — Other Ambulatory Visit: Payer: Self-pay

## 2019-10-07 VITALS — BP 123/81 | HR 84 | Wt 179.8 lb

## 2019-10-07 DIAGNOSIS — Z0000 Encounter for general adult medical examination without abnormal findings: Secondary | ICD-10-CM

## 2019-10-07 DIAGNOSIS — Z01419 Encounter for gynecological examination (general) (routine) without abnormal findings: Secondary | ICD-10-CM | POA: Diagnosis present

## 2019-10-07 NOTE — Patient Instructions (Signed)
Health Maintenance, Female Adopting a healthy lifestyle and getting preventive care are important in promoting health and wellness. Ask your health care provider about:  The right schedule for you to have regular tests and exams.  Things you can do on your own to prevent diseases and keep yourself healthy. What should I know about diet, weight, and exercise? Eat a healthy diet   Eat a diet that includes plenty of vegetables, fruits, low-fat dairy products, and lean protein.  Do not eat a lot of foods that are high in solid fats, added sugars, or sodium. Maintain a healthy weight Body mass index (BMI) is used to identify weight problems. It estimates body fat based on height and weight. Your health care provider can help determine your BMI and help you achieve or maintain a healthy weight. Get regular exercise Get regular exercise. This is one of the most important things you can do for your health. Most adults should:  Exercise for at least 150 minutes each week. The exercise should increase your heart rate and make you sweat (moderate-intensity exercise).  Do strengthening exercises at least twice a week. This is in addition to the moderate-intensity exercise.  Spend less time sitting. Even light physical activity can be beneficial. Watch cholesterol and blood lipids Have your blood tested for lipids and cholesterol at 34 years of age, then have this test every 5 years. Have your cholesterol levels checked more often if:  Your lipid or cholesterol levels are high.  You are older than 34 years of age.  You are at high risk for heart disease. What should I know about cancer screening? Depending on your health history and family history, you may need to have cancer screening at various ages. This may include screening for:  Breast cancer.  Cervical cancer.  Colorectal cancer.  Skin cancer.  Lung cancer. What should I know about heart disease, diabetes, and high blood  pressure? Blood pressure and heart disease  High blood pressure causes heart disease and increases the risk of stroke. This is more likely to develop in people who have high blood pressure readings, are of African descent, or are overweight.  Have your blood pressure checked: ? Every 3-5 years if you are 18-39 years of age. ? Every year if you are 40 years old or older. Diabetes Have regular diabetes screenings. This checks your fasting blood sugar level. Have the screening done:  Once every three years after age 40 if you are at a normal weight and have a low risk for diabetes.  More often and at a younger age if you are overweight or have a high risk for diabetes. What should I know about preventing infection? Hepatitis B If you have a higher risk for hepatitis B, you should be screened for this virus. Talk with your health care provider to find out if you are at risk for hepatitis B infection. Hepatitis C Testing is recommended for:  Everyone born from 1945 through 1965.  Anyone with known risk factors for hepatitis C. Sexually transmitted infections (STIs)  Get screened for STIs, including gonorrhea and chlamydia, if: ? You are sexually active and are younger than 34 years of age. ? You are older than 34 years of age and your health care provider tells you that you are at risk for this type of infection. ? Your sexual activity has changed since you were last screened, and you are at increased risk for chlamydia or gonorrhea. Ask your health care provider if   you are at risk.  Ask your health care provider about whether you are at high risk for HIV. Your health care provider may recommend a prescription medicine to help prevent HIV infection. If you choose to take medicine to prevent HIV, you should first get tested for HIV. You should then be tested every 3 months for as long as you are taking the medicine. Pregnancy  If you are about to stop having your period (premenopausal) and  you may become pregnant, seek counseling before you get pregnant.  Take 400 to 800 micrograms (mcg) of folic acid every day if you become pregnant.  Ask for birth control (contraception) if you want to prevent pregnancy. Osteoporosis and menopause Osteoporosis is a disease in which the bones lose minerals and strength with aging. This can result in bone fractures. If you are 65 years old or older, or if you are at risk for osteoporosis and fractures, ask your health care provider if you should:  Be screened for bone loss.  Take a calcium or vitamin D supplement to lower your risk of fractures.  Be given hormone replacement therapy (HRT) to treat symptoms of menopause. Follow these instructions at home: Lifestyle  Do not use any products that contain nicotine or tobacco, such as cigarettes, e-cigarettes, and chewing tobacco. If you need help quitting, ask your health care provider.  Do not use street drugs.  Do not share needles.  Ask your health care provider for help if you need support or information about quitting drugs. Alcohol use  Do not drink alcohol if: ? Your health care provider tells you not to drink. ? You are pregnant, may be pregnant, or are planning to become pregnant.  If you drink alcohol: ? Limit how much you use to 0-1 drink a day. ? Limit intake if you are breastfeeding.  Be aware of how much alcohol is in your drink. In the [place] one drink equals one 12 oz bottle of beer (355 mL), one 5 oz glass of wine (148 mL), or one 1 oz glass of hard liquor (44 mL). General instructions  Schedule regular health, dental, and eye exams.  Stay current with your vaccines.  Tell your health care provider if: ? You often feel depressed. ? You have ever been abused or do not feel safe at home. Summary  Adopting a healthy lifestyle and getting preventive care are important in promoting health and wellness.  Follow your health care provider's instructions about healthy  diet, exercising, and getting tested or screened for diseases.  Follow your health care provider's instructions on monitoring your cholesterol and blood pressure. This information is not intended to replace advice given to you by your health care provider. Make sure you discuss any questions you have with your health care provider. Document Revised: 05/08/2018 Document Reviewed: 05/08/2018 Elsevier Patient Education  2020 Elsevier Inc.  

## 2019-10-07 NOTE — Progress Notes (Signed)
GYNECOLOGY CLINIC ANNUAL PREVENTATIVE CARE ENCOUNTER NOTE  Subjective:   Stacey [name] is a 34 y.o. [phone] female here for a routine annual gynecologic exam.  Current complaints: none.   Denies abnormal vaginal bleeding, discharge, pelvic pain, problems with intercourse or other gynecologic concerns.    Gynecologic History No LMP recorded. Contraception: none Last Pap: 02/06/2017. Results were: normal Last mammogram: n/a.   Obstetric History OB History  Gravida Para Term Preterm AB Living  3 2 2   1 2   SAB TAB Ectopic Multiple Live Births  1     0 2    # Outcome Date GA Lbr Len/2nd Weight Sex Delivery Anes PTL Lv  3 Term 08/01/19 [redacted]w[redacted]d 07:07 / 00:04 6 lb 14.6 oz (3.135 kg) M Vag-Spont None  LIV  2 Term 06/27/17 [redacted]w[redacted]d / 00:10 5 lb 15.2 oz (2.699 kg) M Vag-Spont None  LIV  1 SAB             Past Medical History:  Diagnosis Date  . Gestational diabetes   . Left carotid bruit 09/05/2019   Left carotid bruit    Past Surgical History:  Procedure Laterality Date  . NO PAST SURGERIES      Current Outpatient Medications on File Prior to Visit  Medication Sig Dispense Refill  . prenatal vitamin w/FE, FA (PRENATAL 1 + 1) 27-1 MG TABS tablet Take 1 tablet by mouth daily at 12 noon.    11/05/2019 acetaminophen (TYLENOL) 325 MG tablet Take 650 mg by mouth every 6 (six) hours as needed for moderate pain.     No current facility-administered medications on file prior to visit.    No Known Allergies  Social History   Socioeconomic History  . Marital status: Single    Spouse name: Not on file  . Number of children: Not on file  . Years of education: Not on file  . Highest education level: Not on file  Occupational History  . Not on file  Tobacco Use  . Smoking status: Never Smoker  . Smokeless tobacco: Never Used  Substance and Sexual Activity  . Alcohol use: No  . Drug use: No  . Sexual activity: Yes    Partners: Male    Birth control/protection: None  Other Topics Concern   . Not on file  Social History Narrative  . Not on file   Social Determinants of Health   Financial Resource Strain:   . Difficulty of Paying Living Expenses:   Food Insecurity: No Food Insecurity  . Worried About Marland Kitchen in the Last Year: Never true  . Ran Out of Food in the Last Year: Never true  Transportation Needs: No Transportation Needs  . Lack of Transportation (Medical): No  . Lack of Transportation (Non-Medical): No  Physical Activity:   . Days of Exercise per Week:   . Minutes of Exercise per Session:   Stress:   . Feeling of Stress :   Social Connections:   . Frequency of Communication with Friends and Family:   . Frequency of Social Gatherings with Friends and Family:   . Attends Religious Services:   . Active Member of Clubs or Organizations:   . Attends Programme researcher, broadcasting/film/video Meetings:   Banker Marital Status:   Intimate Partner Violence:   . Fear of Current or Ex-Partner:   . Emotionally Abused:   Marland Kitchen Physically Abused:   . Sexually Abused:     Family History  Problem Relation Age of  Onset  . Hypertension Mother     The following portions of the patient's history were reviewed and updated as appropriate: allergies, current medications, past family history, past medical history, past social history, past surgical history and problem list.  Review of Systems Pertinent items are noted in HPI.   Objective:  BP 123/81   Pulse 84   Wt 179 lb 12.8 oz (81.6 kg)   BMI 29.02 kg/m  CONSTITUTIONAL: Well-developed, well-nourished female in no acute distress.  HENT:  Normocephalic, atraumatic, External right and left ear normal. Oropharynx is clear and moist EYES: Conjunctivae and EOM are normal. Pupils are equal, round, and reactive to light. No scleral icterus.  NECK: Normal range of motion, supple, no masses.  Normal thyroid.  SKIN: Skin is warm and dry. No rash noted. Not diaphoretic. No erythema. No pallor. [place]: Alert and oriented to person,  place, and time. Normal reflexes, muscle tone coordination. No cranial nerve deficit noted. PSYCHIATRIC: Normal mood and affect. Normal behavior. Normal judgment and thought content. CARDIOVASCULAR: Normal heart rate noted, regular rhythm RESPIRATORY: Clear to auscultation bilaterally. Effort and breath sounds normal, no problems with respiration noted. BREASTS: Symmetric in size. No masses, skin changes, nipple drainage, or lymphadenopathy. ABDOMEN: Soft, normal bowel sounds, no distention noted.  No tenderness, rebound or guarding.  PELVIC: Normal appearing external genitalia; normal appearing vaginal mucosa and cervix.  No abnormal discharge noted.  Pap smear obtained.  Normal uterine size, no other palpable masses, no uterine or adnexal tenderness. MUSCULOSKELETAL: Normal range of motion. No tenderness.  No cyanosis, clubbing, or edema.  2+ distal pulses.   Assessment:  Annual gynecologic examination with pap smear   Plan:  Will follow up results of pap smear and manage accordingly. Encouraged to establish care with a PCP - she has list of providers at home Routine preventative health maintenance measures emphasized. Please refer to After Visit Summary for other counseling recommendations.    Jorje Guild, NP

## 2019-10-08 LAB — CYTOLOGY - PAP
Chlamydia: NEGATIVE
Comment: NEGATIVE
Comment: NEGATIVE
Comment: NORMAL
Diagnosis: NEGATIVE
High risk HPV: NEGATIVE
Neisseria Gonorrhea: NEGATIVE

## 2019-10-20 ENCOUNTER — Ambulatory Visit (HOSPITAL_COMMUNITY)
Admission: EM | Admit: 2019-10-20 | Discharge: 2019-10-20 | Disposition: A | Discharge status: Home / Self Care | Payer: Medicaid Other | Attending: Family Medicine | Admitting: Family Medicine

## 2019-10-20 ENCOUNTER — Other Ambulatory Visit: Payer: Self-pay

## 2019-10-20 ENCOUNTER — Encounter (HOSPITAL_COMMUNITY): Payer: Self-pay | Admitting: Emergency Medicine

## 2019-10-20 DIAGNOSIS — J069 Acute upper respiratory infection, unspecified: Secondary | ICD-10-CM | POA: Diagnosis not present

## 2019-10-20 DIAGNOSIS — R05 Cough: Secondary | ICD-10-CM | POA: Insufficient documentation

## 2019-10-20 DIAGNOSIS — D573 Sickle-cell trait: Secondary | ICD-10-CM | POA: Diagnosis not present

## 2019-10-20 DIAGNOSIS — Z20822 Contact with and (suspected) exposure to covid-19: Secondary | ICD-10-CM | POA: Diagnosis not present

## 2019-10-20 DIAGNOSIS — R110 Nausea: Secondary | ICD-10-CM | POA: Insufficient documentation

## 2019-10-20 MED ORDER — ONDANSETRON 4 MG PO TBDP: 4.0000 mg | ORAL_TABLET | Freq: Three times a day (TID) | ORAL | 0 refills | Status: DC | PRN

## 2019-10-20 MED ORDER — DM-GUAIFENESIN ER 30-600 MG PO TB12: 1.0000 | ORAL_TABLET | Freq: Two times a day (BID) | ORAL | 0 refills | Status: DC

## 2019-10-20 MED ORDER — BENZONATATE 200 MG PO CAPS: 200.0000 mg | ORAL_CAPSULE | Freq: Three times a day (TID) | ORAL | 0 refills | Status: AC | PRN

## 2019-10-20 MED ORDER — FLUTICASONE PROPIONATE 50 MCG/ACT NA SUSP: 1.0000 | Freq: Every day | NASAL | 0 refills | Status: DC

## 2019-10-20 MED ORDER — IBUPROFEN 600 MG PO TABS: 600.0000 mg | ORAL_TABLET | Freq: Four times a day (QID) | ORAL | 0 refills | Status: DC | PRN

## 2019-10-20 NOTE — Discharge Instructions (Signed)
COVID test pending, monitor mychart for results Begin tessalon/benzonatate every 8 hours for cough Mucinex DM twice daily for congestion/cough Flonase for runny nose/congestion Tylneol and ibuprofen for body aches, headache Zofran/ondansetron for nausea Rest and drink fluids Follow up if not improving over the next week

## 2019-10-20 NOTE — ED Triage Notes (Signed)
Pt c/o nauseous, productive cough, fever and bodyaches and chills onset Friday. She states her son was recently treated for a bacterial infection. Has not had a covid vaccine.

## 2019-10-21 NOTE — ED Provider Notes (Signed)
[place]    CSN: [id number] Arrival date & time: 10/20/19  1948      History   Chief Complaint Chief Complaint  Patient presents with  . URI    HPI Stacey [name] is a 33 y.o. female approximately 2 months postpartum presenting today for evaluation of URI symptoms and nausea.  Patient reports that on Friday, she began to have body aches, chills, cough, congestion, sore throat as well as some nausea.  She reports that her son has had similar symptoms.  She denies any abdominal pain vomiting or diarrhea.  She has not measured fevers.  Has not used any medicines for symptoms.  HPI  Past Medical History:  Diagnosis Date  . Gestational diabetes   . Left carotid bruit 09/05/2019   Left carotid bruit    Patient Active Problem List   Diagnosis Date Noted  . Increased heart rate 09/05/2019  . Left carotid bruit 09/05/2019  . Normal labor 07/31/2019  . Eczema 04/16/2019  . Hidradenitis 08/01/2017  . Sickle cell trait ([place]) 02/14/2017  . Alpha thalassemia trait 02/14/2017    Past Surgical History:  Procedure Laterality Date  . NO PAST SURGERIES      OB History    Gravida  3   Para  2   Term  2   Preterm      AB  1   Living  2     SAB  1   TAB      Ectopic      Multiple  0   Live Births  2            Home Medications    Prior to Admission medications   Medication Sig Start Date End Date Taking? Authorizing Provider  prenatal vitamin w/FE, FA (PRENATAL 1 + 1) 27-1 MG TABS tablet Take 1 tablet by mouth daily at 12 noon.   Yes [provider]  acetaminophen (TYLENOL) 325 MG tablet Take 650 mg by mouth every 6 (six) hours as needed for moderate pain.    [provider]  benzonatate (TESSALON) 200 MG capsule Take 1 capsule (200 mg total) by mouth 3 (three) times daily as needed for up to 7 days for cough. 10/20/19 10/27/19  Amos Gaber C, PA-C  dextromethorphan-guaiFENesin (MUCINEX DM) 30-600 MG 12hr tablet Take 1 tablet  by mouth 2 (two) times daily. 10/20/19   Kinnick Maus C, PA-C  fluticasone (FLONASE) 50 MCG/ACT nasal spray Place 1-2 sprays into both nostrils daily for 7 days. 10/20/19 10/27/19  Norell Brisbin C, PA-C  ibuprofen (ADVIL) 600 MG tablet Take 1 tablet (600 mg total) by mouth every 6 (six) hours as needed. 10/20/19   Astoria Condon C, PA-C  ondansetron (ZOFRAN ODT) 4 MG disintegrating tablet Take 1 tablet (4 mg total) by mouth every 8 (eight) hours as needed for nausea or vomiting. 10/20/19   Normal Recinos, Elesa Hacker, PA-C    Family History Family History  Problem Relation Age of Onset  . Hypertension Mother     Social History Social History   Tobacco Use  . Smoking status: Never Smoker  . Smokeless tobacco: Never Used  Substance Use Topics  . Alcohol use: No  . Drug use: No     Allergies   Patient has no known allergies.   Review of Systems Review of Systems  Constitutional: Positive for fatigue. Negative for activity change, appetite change, chills and fever.  HENT: Positive for congestion, rhinorrhea and sore throat. Negative for  ear pain, sinus pressure and trouble swallowing.   Eyes: Negative for discharge and redness.  Respiratory: Positive for cough. Negative for chest tightness and shortness of breath.   Cardiovascular: Negative for chest pain.  Gastrointestinal: Negative for abdominal pain, diarrhea, nausea and vomiting.  Musculoskeletal: Positive for myalgias.  Skin: Negative for rash.  Neurological: Negative for dizziness, light-headedness and headaches.     Physical Exam Triage Vital Signs ED Triage Vitals  Enc Vitals Group     BP 10/20/19 2041 126/85     Pulse Rate 10/20/19 2041 87     Resp 10/20/19 2041 14     Temp 10/20/19 2041 99 F (37.2 C)     Temp Source 10/20/19 2041 Oral     SpO2 10/20/19 2041 100 %     Weight --      Height --      Head Circumference --      Peak Flow --      Pain Score 10/20/19 2038 7     Pain Loc --      Pain Edu? --       Excl. in GC? --    No data found.  Updated Vital Signs BP 126/85 (BP Location: Left Arm)   Pulse 87   Temp 99 F (37.2 C) (Oral)   Resp 14   SpO2 100%   Visual Acuity Right Eye Distance:   Left Eye Distance:   Bilateral Distance:    Right Eye Near:   Left Eye Near:    Bilateral Near:     Physical Exam Vitals and nursing note reviewed.  Constitutional:      Appearance: She is well-developed.     Comments: No acute distress  HENT:     Head: Normocephalic and atraumatic.     Ears:     Comments: Bilateral ears without tenderness to palpation of external auricle, tragus and mastoid, EAC's without erythema or swelling, TM's with good bony landmarks and cone of light. Non erythematous.     Nose: Nose normal.     Comments: Nasal mucosa erythematous    Mouth/Throat:     Comments: Oral mucosa pink and moist, no tonsillar enlargement or exudate. Posterior pharynx patent and nonerythematous, no uvula deviation or swelling. Normal phonation. Eyes:     Conjunctiva/sclera: Conjunctivae normal.  Cardiovascular:     Rate and Rhythm: Normal rate.  Pulmonary:     Effort: Pulmonary effort is normal. No respiratory distress.     Comments: Breathing comfortably at rest, CTABL, no wheezing, rales or other adventitious sounds auscultated Abdominal:     General: There is no distension.  Musculoskeletal:        General: Normal range of motion.     Cervical back: Neck supple.  Skin:    General: Skin is warm and dry.  Neurological:     Mental Status: She is alert and oriented to person, place, and time.      UC Treatments / Results  Labs (all labs ordered are listed, but only abnormal results are displayed) Labs Reviewed  SARS CORONAVIRUS 2 (TAT 6-24 HRS)    EKG   Radiology No results found.  Procedures Procedures (including critical care time)  Medications Ordered in UC Medications - No data to display  Initial Impression / Assessment and Plan / UC Course  I have  reviewed the triage vital signs and the nursing notes.  Pertinent labs & imaging results that were available during my care of the patient were reviewed  by me and considered in my medical decision making (see chart for details).     4 days of URI symptoms, vital signs stable, exam unremarkable.  Most likely viral URI.  Recommending symptomatic and supportive care.  Rest and fluids.  Covid PCR pending.Discussed strict return precautions. Patient verbalized understanding and is agreeable with plan.  Final Clinical Impressions(s) / UC Diagnoses   Final diagnoses:  Viral URI with cough     Discharge Instructions     COVID test pending, monitor mychart for results Begin tessalon/benzonatate every 8 hours for cough Mucinex DM twice daily for congestion/cough Flonase for runny nose/congestion Tylneol and ibuprofen for body aches, headache Zofran/ondansetron for nausea Rest and drink fluids Follow up if not improving over the next week   ED Prescriptions    Medication Sig Dispense Auth. Provider   fluticasone (FLONASE) 50 MCG/ACT nasal spray Place 1-2 sprays into both nostrils daily for 7 days. 1 g Shearon Clonch C, PA-C   benzonatate (TESSALON) 200 MG capsule Take 1 capsule (200 mg total) by mouth 3 (three) times daily as needed for up to 7 days for cough. 28 capsule Ajanee Buren C, PA-C   dextromethorphan-guaiFENesin (MUCINEX DM) 30-600 MG 12hr tablet Take 1 tablet by mouth 2 (two) times daily. 20 tablet Keiosha Cancro C, PA-C   ondansetron (ZOFRAN ODT) 4 MG disintegrating tablet Take 1 tablet (4 mg total) by mouth every 8 (eight) hours as needed for nausea or vomiting. 20 tablet Rayssa Atha C, PA-C   ibuprofen (ADVIL) 600 MG tablet Take 1 tablet (600 mg total) by mouth every 6 (six) hours as needed. 30 tablet Arabia Nylund, [place] C, PA-C     PDMP not reviewed this encounter.   Sharyon Cable [place] C, [place] 10/21/19 [phone]

## 2019-10-22 LAB — SARS CORONAVIRUS 2 (TAT 6-24 HRS): SARS Coronavirus 2: NEGATIVE

## 2020-03-18 IMAGING — US OBSTETRIC <14 WK US AND TRANSVAGINAL OB US
1 series · 15 of 28 positions shown · non-contrast
Comparison: None for this gestation

CLINICAL DATA: Vaginal bleeding and abdominal pain in first
trimester of pregnancy

EXAM:
OBSTETRIC <14 WK US AND TRANSVAGINAL OB US
TECHNIQUE: Both transabdominal and transvaginal ultrasound examinations were
performed for complete evaluation of the gestation as well as the
maternal uterus, adnexal regions, and pelvic cul-de-sac.
Transvaginal technique was performed to assess early pregnancy.

[Series 1: obstetric <14 wk us and transvaginal ob us · 15 of 78 slices shown]
[im 1/78]
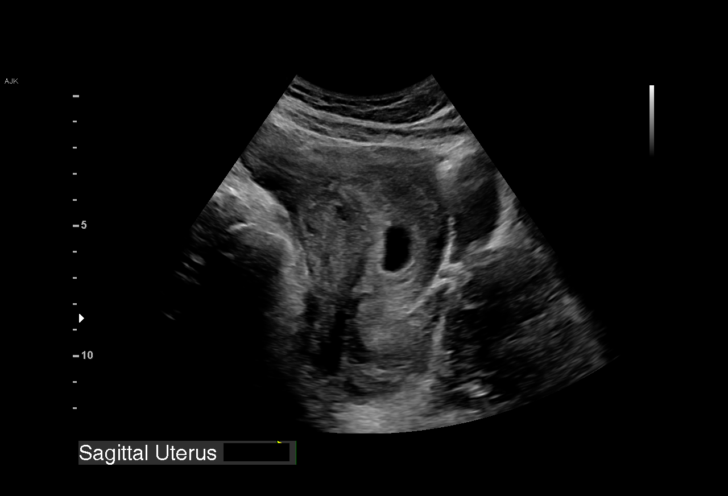
[im 6/78]
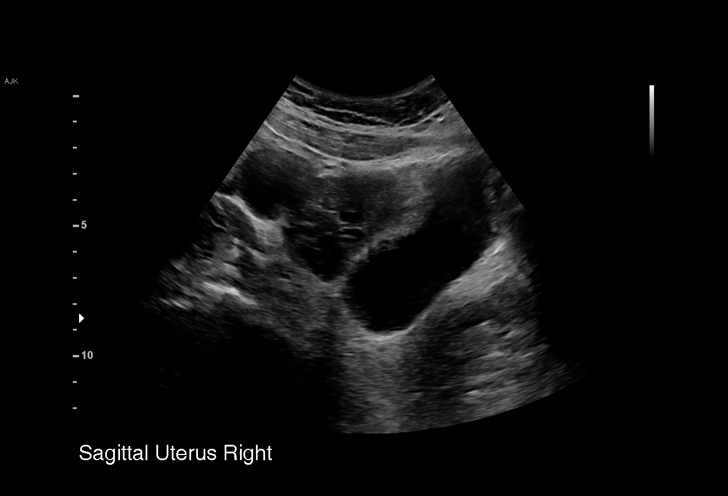
[im 12/78]
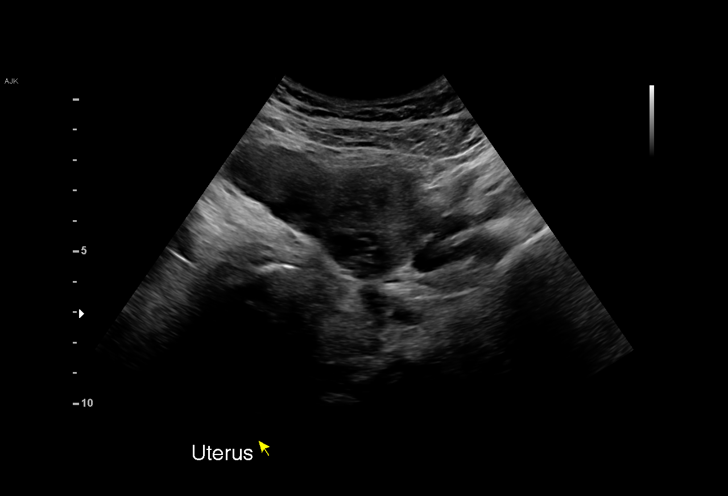
[im 18/78]
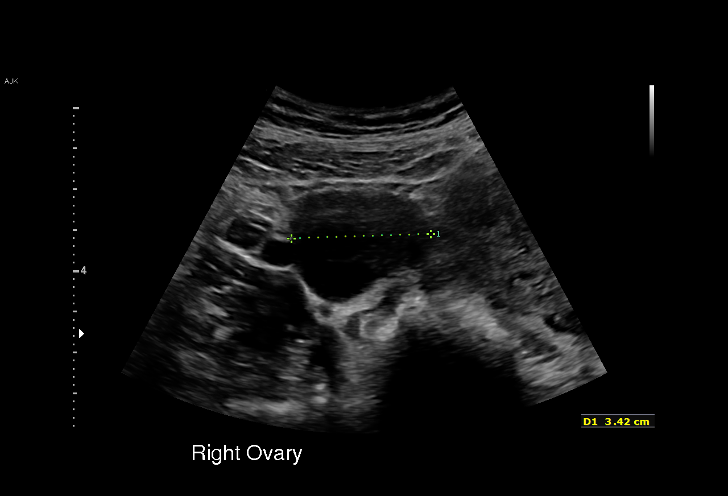
[im 23/78]
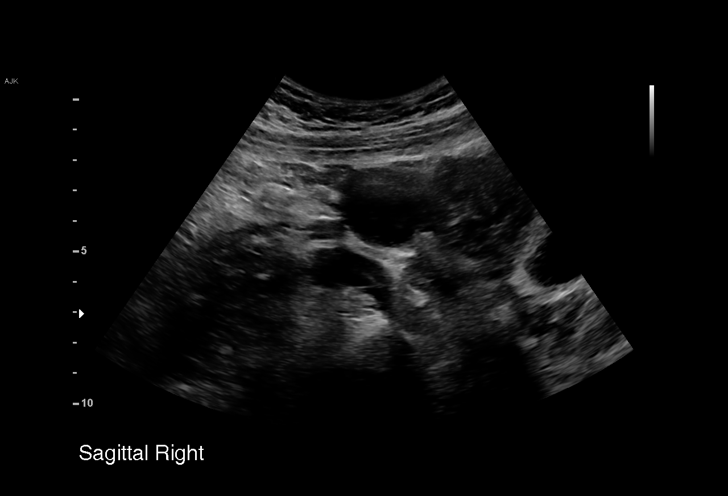
[im 29/78]
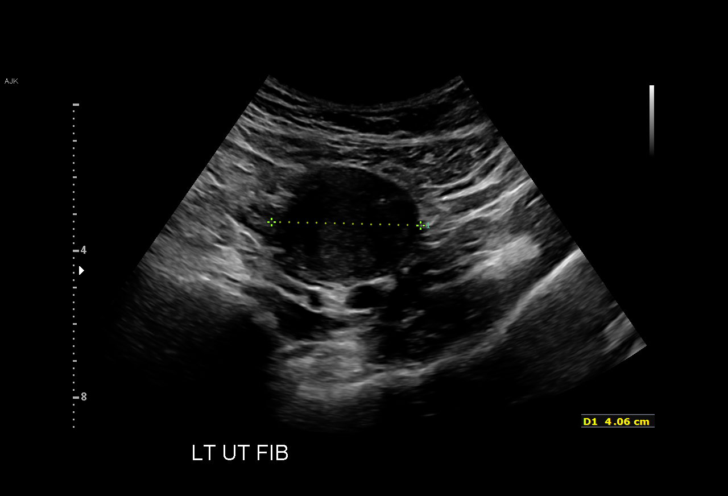
[im 35/78]
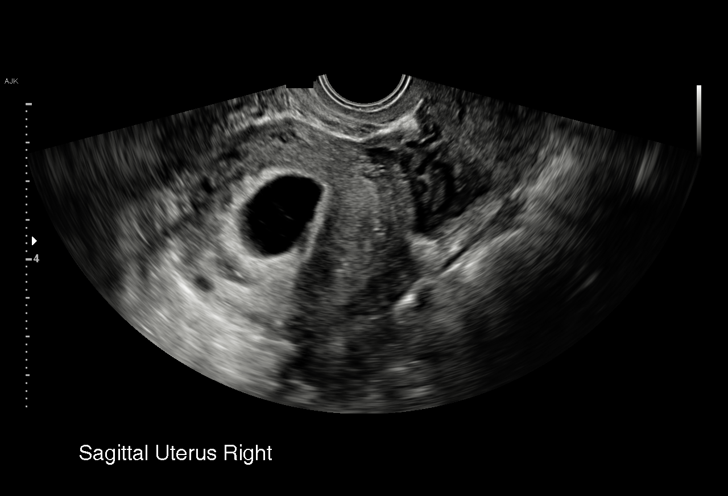
[im 40/78]
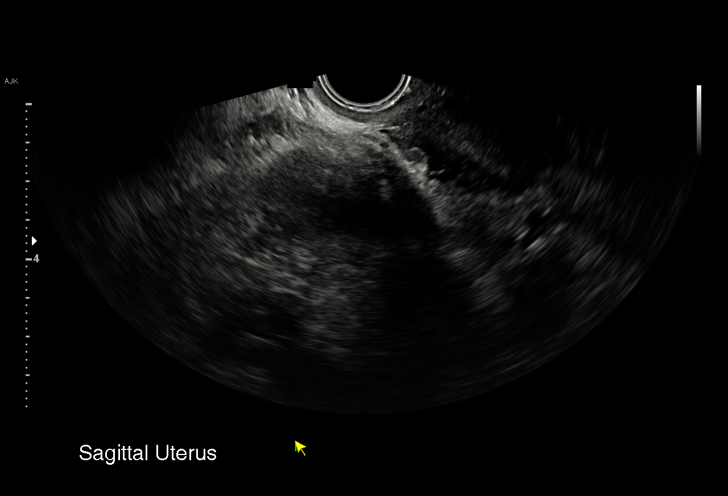
[im 43/78]
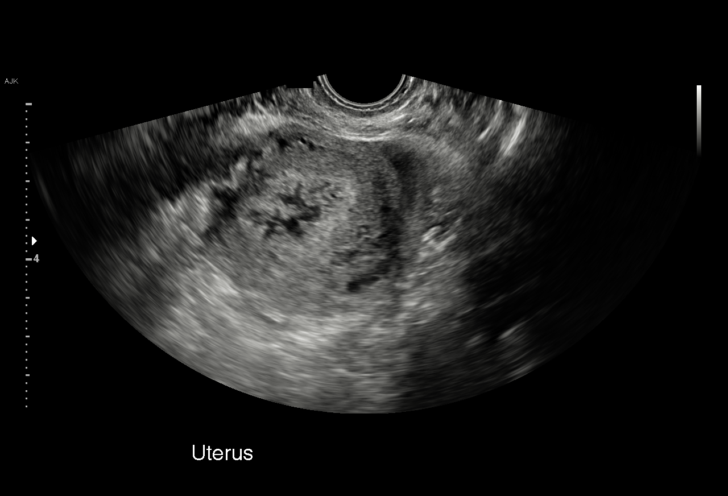
[im 49/78]
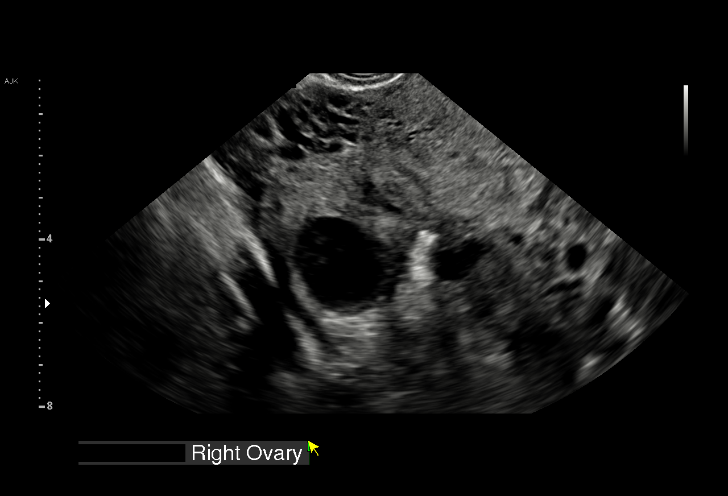
[im 55/78]
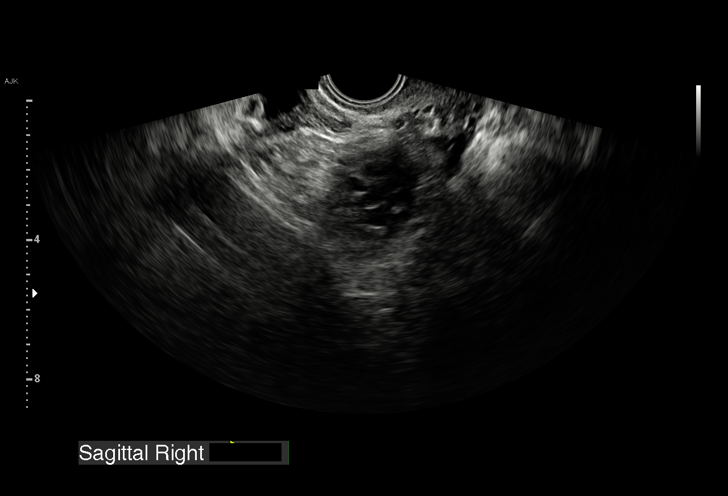
[im 60/78]
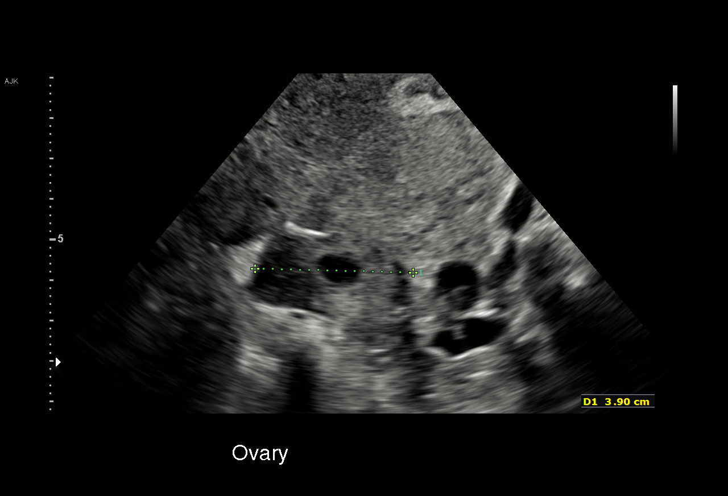
[im 66/78]
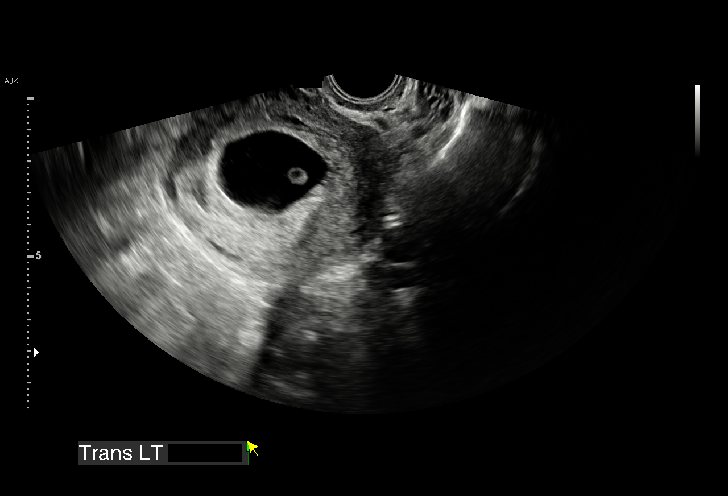
[im 72/78]
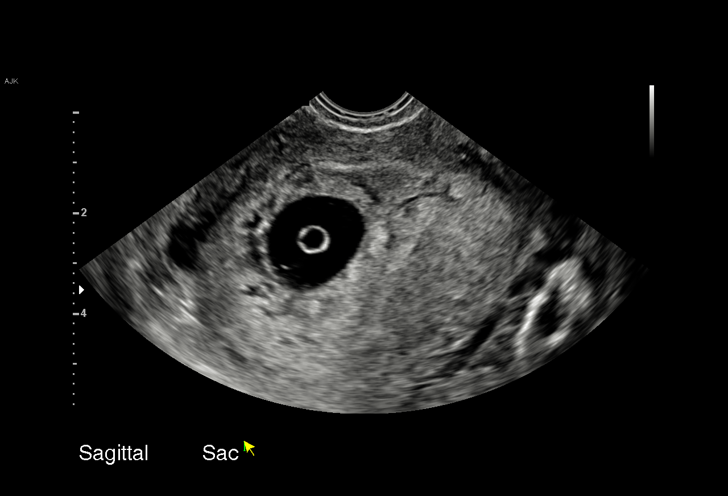
[im 78/78]
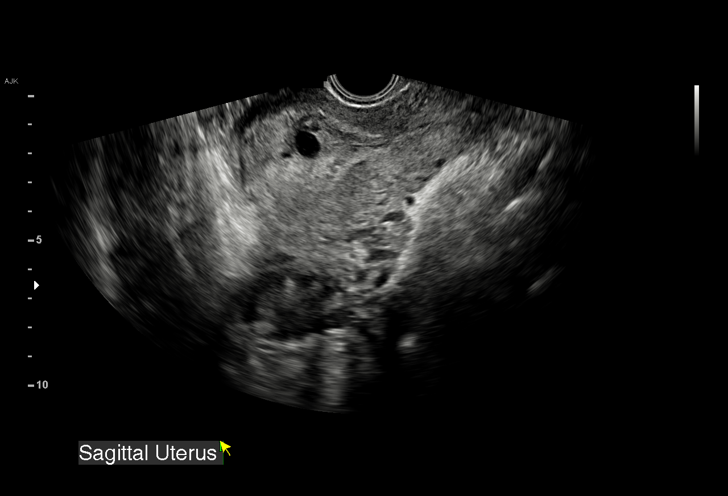

[15 of 28 positions shown; findings below may reference images not displayed]

FINDINGS: Intrauterine gestational sac: Present, single

Yolk sac:  Present

Embryo:  Not visualized

Cardiac Activity: N/A

Heart Rate: N/A  bpm

MSD: 26.5 mm   7 w   4 d

Subchorionic hemorrhage:  None visualized.

Maternal uterus/adnexae:

Small posterior fundal leiomyoma 3.5 x 3.4 x 4.1 cm, exophytic.

RIGHT ovary normal size and morphology 3.9 x 3.4 x 3.5 cm,
containing small corpus luteum.

LEFT ovary normal size and morphology 3.2 x 2.1 x 3.9 cm.

No free pelvic fluid or adnexal masses.
IMPRESSION: Intrauterine gestational sac containing a yolk sac but no fetal pole
is visualized.

Based on mean sac diameter of 25 mm or greater, findings meet
definitive criteria for failed pregnancy. This follows SRU consensus
guidelines: Diagnostic Criteria for Nonviable Pregnancy Early in the
First Trimester. N Engl J Med 6938;[DATE].

## 2020-05-18 ENCOUNTER — Other Ambulatory Visit: Payer: Self-pay | Admitting: Obstetrics and Gynecology

## 2020-05-18 ENCOUNTER — Other Ambulatory Visit: Payer: Self-pay

## 2020-05-18 ENCOUNTER — Ambulatory Visit
Admission: RE | Admit: 2020-05-18 | Discharge: 2020-05-18 | Disposition: A | Discharge status: Home / Self Care | Payer: Self-pay | Source: Ambulatory Visit | Attending: Obstetrics and Gynecology | Admitting: Obstetrics and Gynecology

## 2020-05-18 DIAGNOSIS — R7611 Nonspecific reaction to tuberculin skin test without active tuberculosis: Secondary | ICD-10-CM

## 2020-08-06 ENCOUNTER — Ambulatory Visit
Admission: RE | Admit: 2020-08-06 | Discharge: 2020-08-06 | Disposition: A | Discharge status: Home / Self Care | Payer: Medicaid Other | Source: Ambulatory Visit | Attending: Family Medicine | Admitting: Family Medicine

## 2020-08-06 ENCOUNTER — Other Ambulatory Visit: Payer: Self-pay | Admitting: Family Medicine

## 2020-08-06 DIAGNOSIS — M25552 Pain in left hip: Secondary | ICD-10-CM

## 2020-08-06 DIAGNOSIS — M25532 Pain in left wrist: Secondary | ICD-10-CM

## 2020-08-06 DIAGNOSIS — W108XXD Fall (on) (from) other stairs and steps, subsequent encounter: Secondary | ICD-10-CM

## 2020-08-06 DIAGNOSIS — M25512 Pain in left shoulder: Secondary | ICD-10-CM

## 2021-05-29 NOTE — L&D Delivery Note (Addendum)
OB/GYN Faculty Practice Delivery Note  Stacey [name] is a 36 y.o. [id number] s/p VD at [redacted]w[redacted]d. She was admitted for SROM w/ light meconium.   ROM: 3h 32m with light meconium fluid GBS Status:  POSITIVE/-- (12/27 0530), PCN ordered, the patient did not get Maximum Maternal Temperature: 97.8  Labor Progress: Initial SVE: 2.5/60-2. She then precipitously progressed to complete.   Delivery Date/Time: 05/24/2022 @0751  Delivery: Called to room and patient was complete and pushing. NICU attended delivery due to decels to 80s and light meconium fluid. Head delivered LOA. No nuchal cord present. Shoulder and body delivered in usual fashion. Infant with spontaneous cry, placed on mother's abdomen, dried and stimulated. Cord clamped x 2 after 1-minute delay, and cut by FOB. Cord blood drawn. Placenta delivered spontaneously with gentle cord traction. Fundus firm with massage and Pitocin. Labia, perineum, vagina, and cervix inspected without lacerations and good hemostasis.  Baby Weight: pending  Placenta: 3 vessel, intact. Sent to L&D Complications: None Lacerations: None EBL: 57 mL Analgesia: Epidural   Infant:  APGAR (1 MIN): 8   APGAR (5 MINS): 9    Boe Deans Autry-Lott, DO OB Fellow, Faculty , [facility] 05/24/2022, 8:11 AM

## 2021-10-07 ENCOUNTER — Encounter (HOSPITAL_COMMUNITY): Payer: Self-pay | Admitting: *Deleted

## 2021-10-07 ENCOUNTER — Inpatient Hospital Stay (HOSPITAL_COMMUNITY)
Admission: AD | Admit: 2021-10-07 | Discharge: 2021-10-07 | Disposition: A | Discharge status: Home / Self Care | Payer: Medicaid Other | Attending: Obstetrics and Gynecology | Admitting: Obstetrics and Gynecology

## 2021-10-07 ENCOUNTER — Inpatient Hospital Stay (HOSPITAL_COMMUNITY): Payer: Medicaid Other

## 2021-10-07 DIAGNOSIS — Z3A01 Less than 8 weeks gestation of pregnancy: Secondary | ICD-10-CM | POA: Diagnosis not present

## 2021-10-07 DIAGNOSIS — O26891 Other specified pregnancy related conditions, first trimester: Secondary | ICD-10-CM | POA: Diagnosis not present

## 2021-10-07 DIAGNOSIS — O418X10 Other specified disorders of amniotic fluid and membranes, first trimester, not applicable or unspecified: Secondary | ICD-10-CM

## 2021-10-07 DIAGNOSIS — O4691 Antepartum hemorrhage, unspecified, first trimester: Secondary | ICD-10-CM | POA: Diagnosis not present

## 2021-10-07 DIAGNOSIS — R109 Unspecified abdominal pain: Secondary | ICD-10-CM | POA: Diagnosis not present

## 2021-10-07 DIAGNOSIS — Z3491 Encounter for supervision of normal pregnancy, unspecified, first trimester: Secondary | ICD-10-CM

## 2021-10-07 DIAGNOSIS — O219 Vomiting of pregnancy, unspecified: Secondary | ICD-10-CM

## 2021-10-07 DIAGNOSIS — O208 Other hemorrhage in early pregnancy: Secondary | ICD-10-CM | POA: Insufficient documentation

## 2021-10-07 LAB — COMPREHENSIVE METABOLIC PANEL
ALT: 11 U/L (ref 0–44)
AST: 14 U/L — ABNORMAL LOW (ref 15–41)
Albumin: 3.4 g/dL — ABNORMAL LOW (ref 3.5–5.0)
Alkaline Phosphatase: 55 U/L (ref 38–126)
Anion gap: 3 — ABNORMAL LOW (ref 5–15)
BUN: 5 mg/dL — ABNORMAL LOW (ref 6–20)
CO2: 24 mmol/L (ref 22–32)
Calcium: 9.2 mg/dL (ref 8.9–10.3)
Chloride: 107 mmol/L (ref 98–111)
Creatinine, Ser: 0.68 mg/dL (ref 0.44–1.00)
GFR, Estimated: >60 mL/min (ref >60)
Glucose, Bld: 86 mg/dL (ref 70–99)
Potassium: 4.1 mmol/L (ref 3.5–5.1)
Sodium: 134 mmol/L — ABNORMAL LOW (ref 135–145)
Total Bilirubin: 1.2 mg/dL (ref 0.3–1.2)
Total Protein: 7 g/dL (ref 6.5–8.1)

## 2021-10-07 LAB — URINALYSIS, ROUTINE W REFLEX MICROSCOPIC
Bilirubin Urine: NEGATIVE
Glucose, UA: NEGATIVE mg/dL
Hgb urine dipstick: NEGATIVE
Ketones, ur: NEGATIVE mg/dL
Leukocytes,Ua: NEGATIVE
Nitrite: NEGATIVE
Protein, ur: NEGATIVE mg/dL
Specific Gravity, Urine: 1.014 (ref 1.005–1.030)
pH: 5 (ref 5.0–8.0)

## 2021-10-07 LAB — CBC
HCT: 37 % (ref 36.0–46.0)
Hemoglobin: 12.1 g/dL (ref 12.0–15.0)
MCH: 25.6 pg — ABNORMAL LOW (ref 26.0–34.0)
MCHC: 32.7 g/dL (ref 30.0–36.0)
MCV: 78.2 fL — ABNORMAL LOW (ref 80.0–100.0)
Platelets: 276 10*3/uL (ref 150–400)
RBC: 4.73 MIL/uL (ref 3.87–5.11)
RDW: 13.9 % (ref 11.5–15.5)
WBC: 5.2 10*3/uL (ref 4.0–10.5)
nRBC: 0 % (ref 0.0–0.2)

## 2021-10-07 LAB — POCT PREGNANCY, URINE: Preg Test, Ur: POSITIVE — AB

## 2021-10-07 LAB — HCG, QUANTITATIVE, PREGNANCY: hCG, Beta Chain, Quant, S: 21881 m[IU]/mL — ABNORMAL HIGH (ref <5)

## 2021-10-07 MED ORDER — PROMETHAZINE HCL 25 MG PO TABS
25.0000 mg | ORAL_TABLET | Freq: Once | ORAL | Status: AC
  Administered 2021-10-07: 25 mg via ORAL
  Filled 2021-10-07: qty 1

## 2021-10-07 MED ORDER — PROMETHAZINE HCL 12.5 MG PO TABS: 12.5000 mg | ORAL_TABLET | Freq: Four times a day (QID) | ORAL | 0 refills | Status: DC | PRN

## 2021-10-07 NOTE — MAU Provider Note (Signed)
?History  ?  ?[id number] ? ?Arrival date and time: 10/07/21 1300 ?  ?Chief Complaint  ?Patient presents with  ? Emesis  ? ?HPI ?Stacey [name] is a 36 y.o. at [redacted]w[redacted]d by LMP presenting for nausea/vomiting and abdominal pain.  ? ?Patient states that she took a pregnancy test a couple weeks ago because she was feeling nauseated and missed her period. The test was positive.  ? ?Since then, she has had intermittent nausea and vomiting. She has been able to eat and drink some in between. She reports she has been voiding normally.  ? ?She describes the abdominal pain more as a discomfort. She states it is the feeling she gets when she feels like she is about to vomit. She denies any vaginal bleeding, pelvic pain, vaginal discharge, dysuria, or fever.  ? ?She has not taken anything for her symptoms thus far. She last vomited this morning. She was able to eat some cereal afterwards without issue.  ? ?She reports she is prediabetic and was recently started on Metformin by her primary doctor. She has no other chronic medical issues. She takes no other chronic medications.  ? ?OB History   ? ? Gravida  ?4  ? Para  ?2  ? Term  ?2  ? Preterm  ?   ? AB  ?1  ? Living  ?2  ?  ? ? SAB  ?1  ? IAB  ?   ? Ectopic  ?   ? Multiple  ?0  ? Live Births  ?2  ?   ?  ?  ? ? ?Past Medical History:  ?Diagnosis Date  ? Gestational diabetes   ? Left carotid bruit 09/05/2019  ? Left carotid bruit  ? ? ?Past Surgical History:  ?Procedure Laterality Date  ? NO PAST SURGERIES    ? ? ?Family History  ?Problem Relation Age of Onset  ? Hypertension Mother   ? ? ?Social History  ? ?Socioeconomic History  ? Marital status: Single  ?  Spouse name: Not on file  ? Number of children: Not on file  ? Years of education: Not on file  ? Highest education level: Not on file  ?Occupational History  ? Not on file  ?Tobacco Use  ? Smoking status: Never  ? Smokeless tobacco: Never  ?Vaping Use  ? Vaping Use: Never used  ?Substance and Sexual Activity  ? Alcohol use: No  ?  Drug use: No  ? Sexual activity: Yes  ?  Partners: Male  ?  Birth control/protection: None  ?Other Topics Concern  ? Not on file  ?Social History Narrative  ? Not on file  ? ?Social Determinants of Health  ? ?Financial Resource Strain: Not on file  ?Food Insecurity: Not on file  ?Transportation Needs: Not on file  ?Physical Activity: Not on file  ?Stress: Not on file  ?Social Connections: Not on file  ?Intimate Partner Violence: Not on file  ? ? ?No Known Allergies ? ?No current facility-administered medications on file prior to encounter.  ? ?Current Outpatient Medications on File Prior to Encounter  ?Medication Sig Dispense Refill  ? metFORMIN (GLUCOPHAGE) 500 MG tablet Take by mouth 2 (two) times daily with a meal.    ? prenatal vitamin w/FE, FA (PRENATAL 1 + 1) 27-1 MG TABS tablet Take 1 tablet by mouth daily at 12 noon.    ? acetaminophen (TYLENOL) 325 MG tablet Take 650 mg by mouth every 6 (six) hours as needed for moderate pain.    ?  dextromethorphan-guaiFENesin (MUCINEX DM) 30-600 MG 12hr tablet Take 1 tablet by mouth 2 (two) times daily. 20 tablet 0  ? fluticasone (FLONASE) 50 MCG/ACT nasal spray Place 1-2 sprays into both nostrils daily for 7 days. 1 g 0  ? ibuprofen (ADVIL) 600 MG tablet Take 1 tablet (600 mg total) by mouth every 6 (six) hours as needed. 30 tablet 0  ? ondansetron (ZOFRAN ODT) 4 MG disintegrating tablet Take 1 tablet (4 mg total) by mouth every 8 (eight) hours as needed for nausea or vomiting. 20 tablet 0  ? ?ROS ?Pertinent positives and negative per HPI, all others reviewed and negative. ? ?Physical Exam  ? ?BP (!) 122/59   Pulse 89   Temp 97.8 ?F (36.6 ?C)   Resp 18   Ht 5\' 7"  (1.702 m)   LMP 08/24/2021   BMI 28.16 kg/m?  ? ?Physical Exam ?Constitutional:   ?   General: She is not in acute distress. ?   Appearance: Normal appearance. She is not toxic-appearing.  ?HENT:  ?   Head: Normocephalic and atraumatic.  ?   Mouth/Throat:  ?   Mouth: Mucous membranes are moist.  ?Eyes:  ?    Conjunctiva/sclera: Conjunctivae normal.  ?Cardiovascular:  ?   Rate and Rhythm: Normal rate.  ?Pulmonary:  ?   Effort: Pulmonary effort is normal.  ?Abdominal:  ?   Palpations: Abdomen is soft.  ?   Tenderness: There is abdominal tenderness (Mild tenderness to palpation in LLQ). There is no guarding or rebound.  ?Musculoskeletal:     ?   General: Normal range of motion.  ?   Right lower leg: No edema.  ?   Left lower leg: No edema.  ?Skin: ?   General: Skin is warm and dry.  ?Neurological:  ?   Mental Status: She is alert and oriented to person, place, and time.  ?Psychiatric:     ?   Mood and Affect: Mood normal.     ?   Behavior: Behavior normal.  ? ?Labs ?Results for orders placed or performed during the hospital encounter of 10/07/21 (from the past 24 hour(s))  ?Pregnancy, urine POC     Status: Abnormal  ? Collection Time: 10/07/21  1:17 PM  ?Result Value Ref Range  ? Preg Test, Ur POSITIVE (A) NEGATIVE  ?Urinalysis, Routine w reflex microscopic     Status: Abnormal  ? Collection Time: 10/07/21  1:22 PM  ?Result Value Ref Range  ? Color, Urine YELLOW YELLOW  ? APPearance HAZY (A) CLEAR  ? Specific Gravity, Urine 1.014 1.005 - 1.030  ? pH 5.0 5.0 - 8.0  ? Glucose, UA NEGATIVE NEGATIVE mg/dL  ? Hgb urine dipstick NEGATIVE NEGATIVE  ? Bilirubin Urine NEGATIVE NEGATIVE  ? Ketones, ur NEGATIVE NEGATIVE mg/dL  ? Protein, ur NEGATIVE NEGATIVE mg/dL  ? Nitrite NEGATIVE NEGATIVE  ? Leukocytes,Ua NEGATIVE NEGATIVE  ?CBC     Status: Abnormal  ? Collection Time: 10/07/21  2:06 PM  ?Result Value Ref Range  ? WBC 5.2 4.0 - 10.5 K/uL  ? RBC 4.73 3.87 - 5.11 MIL/uL  ? Hemoglobin 12.1 12.0 - 15.0 g/dL  ? HCT 37.0 36.0 - 46.0 %  ? MCV 78.2 (L) 80.0 - 100.0 fL  ? MCH 25.6 (L) 26.0 - 34.0 pg  ? MCHC 32.7 30.0 - 36.0 g/dL  ? RDW 13.9 11.5 - 15.5 %  ? Platelets 276 150 - 400 K/uL  ? nRBC 0.0 0.0 - 0.2 %  ?hCG, quantitative,  pregnancy     Status: Abnormal  ? Collection Time: 10/07/21  2:06 PM  ?Result Value Ref Range  ? hCG, Beta  Chain, Quant, S 21,881 (H) <5 mIU/mL  ?Comprehensive metabolic panel     Status: Abnormal  ? Collection Time: 10/07/21  2:06 PM  ?Result Value Ref Range  ? Sodium 134 (L) 135 - 145 mmol/L  ? Potassium 4.1 3.5 - 5.1 mmol/L  ? Chloride 107 98 - 111 mmol/L  ? CO2 24 22 - 32 mmol/L  ? Glucose, Bld 86 70 - 99 mg/dL  ? BUN 5 (L) 6 - 20 mg/dL  ? Creatinine, Ser 0.68 0.44 - 1.00 mg/dL  ? Calcium 9.2 8.9 - 10.3 mg/dL  ? Total Protein 7.0 6.5 - 8.1 g/dL  ? Albumin 3.4 (L) 3.5 - 5.0 g/dL  ? AST 14 (L) 15 - 41 U/L  ? ALT 11 0 - 44 U/L  ? Alkaline Phosphatase 55 38 - 126 U/L  ? Total Bilirubin 1.2 0.3 - 1.2 mg/dL  ? GFR, Estimated >60 >60 mL/min  ? Anion gap 3 (L) 5 - 15  ? ?Imaging ?US OB LESS THAN 14 WEEKS WITH OB TRANSVAGINAL ? ?Result Date: 10/07/2021 ?CLINICAL DATA:  Abdominal pain, quantitative beta HCG 21,881 EXAM: OBSTETRIC <14 WK [place] AND TRANSVAGINAL OB US TECHNIQUE: Both transabdominal and transvaginal ultrasound examinations were performed for complete evaluation of the gestation as well as the maternal uterus, adnexal regions, and pelvic cul-de-sac. Transvaginal technique was performed to assess early pregnancy. COMPARISON:  None Available. FINDINGS: Intrauterine gestational sac: Single Yolk sac:  Visualized. Embryo:  Visualized. Cardiac Activity: Visualized. Heart Rate: 120 bpm CRL:  2.3 mm   5 w   5 d                  [place] EDC: 05/31/2022 Subchorionic hemorrhage: Trace subchorionic hemorrhage along the posteroinferior margin of the gestational sac. Maternal uterus/adnexae: Right ovary measures 3.4 x 2.3 by 2.8 cm and the left ovary measures 3.5 x 2.1 x 2.6 cm. Likely corpus luteum cyst within the right ovary measuring up to 1.2 cm. No pelvic free fluid. IMPRESSION: 1. Single live intrauterine pregnancy as above estimated age 110 weeks and 5 days. 2. Trace subchorionic hemorrhage. Electronically Signed   By: Sharlet Salina M.D.   On: 10/07/2021 17:16   ? ?MAU Course  ?Procedures ?Lab Orders    ?     Urinalysis, Routine w  reflex microscopic Urine, Clean Catch    ?     CBC    ?     hCG, quantitative, pregnancy    ?     Comprehensive metabolic panel    ?     Pregnancy, urine POC    ? ?Meds ordered this encounter  ?Medications  ? p

## 2021-10-07 NOTE — MAU Note (Signed)
.  Stacey [name] is a 36 y.o. at Unknown here in MAU reporting: pt n/v x 2 weeks and abd cramping. Denies and vag bleeding or discharge. Had positive HPT.  ?LMP: 08/24/2021 ?Onset of complaint: 2 weeks ?Pain score: 6/10 ?Vitals:  ? 10/07/21 1322  ?BP: (!) 122/59  ?Pulse: 89  ?Resp: 18  ?Temp: 97.8 ?F (36.6 ?C)  ?   ?FHT:n/a ? ?Lab orders placed from triage:   u/a, UPT ?

## 2021-11-07 ENCOUNTER — Telehealth: Payer: Self-pay | Admitting: Family Medicine

## 2021-11-07 MED ORDER — PROMETHAZINE HCL 25 MG PO TABS: 25.0000 mg | ORAL_TABLET | Freq: Four times a day (QID) | ORAL | 1 refills | Status: DC | PRN

## 2021-11-07 MED ORDER — ONDANSETRON HCL 8 MG PO TABS: 8.0000 mg | ORAL_TABLET | Freq: Three times a day (TID) | ORAL | 1 refills | Status: DC | PRN

## 2021-11-07 NOTE — Telephone Encounter (Signed)
Patient is requesting a RX for nausea  

## 2021-11-07 NOTE — Telephone Encounter (Signed)
Called and spoke with patient. She reports that she has been taking Phenergan and was needing to combine with Zofran for it to work. The Zofran she had a home from a previous event. Spoke with Dr. Alysia Penna who approved Phenergan and Zofran refills. Patient informed they will be sent to her Pharmacy. Patient voiced understanding.

## 2021-12-06 ENCOUNTER — Telehealth (INDEPENDENT_AMBULATORY_CARE_PROVIDER_SITE_OTHER): Payer: Medicaid Other

## 2021-12-06 ENCOUNTER — Other Ambulatory Visit (HOSPITAL_COMMUNITY)
Admission: RE | Admit: 2021-12-06 | Discharge: 2021-12-06 | Disposition: A | Discharge status: Home / Self Care | Payer: Medicaid Other | Source: Ambulatory Visit | Attending: Family Medicine | Admitting: Family Medicine

## 2021-12-06 VITALS — Wt 175.3 lb

## 2021-12-06 DIAGNOSIS — O26899 Other specified pregnancy related conditions, unspecified trimester: Secondary | ICD-10-CM

## 2021-12-06 DIAGNOSIS — O0990 Supervision of high risk pregnancy, unspecified, unspecified trimester: Secondary | ICD-10-CM | POA: Insufficient documentation

## 2021-12-06 DIAGNOSIS — N898 Other specified noninflammatory disorders of vagina: Secondary | ICD-10-CM | POA: Diagnosis present

## 2021-12-06 DIAGNOSIS — R7303 Prediabetes: Secondary | ICD-10-CM

## 2021-12-06 DIAGNOSIS — Z3A00 Weeks of gestation of pregnancy not specified: Secondary | ICD-10-CM

## 2021-12-06 DIAGNOSIS — O09529 Supervision of elderly multigravida, unspecified trimester: Secondary | ICD-10-CM | POA: Insufficient documentation

## 2021-12-06 NOTE — Progress Notes (Unsigned)
New OB Intake  I connected with  Stacey [name] on 12/07/21 at  3:15 PM EDT by MyChart Video Visit and verified that I am speaking with the correct person using two identifiers. Nurse is located at [facility] and pt is outside of her home with her 2 children.  I discussed the limitations, risks, security and privacy concerns of performing an evaluation and management service by telephone and the availability of in person appointments. I also discussed with the patient that there may be a patient responsible charge related to this service. The patient expressed understanding and agreed to proceed.  I explained I am completing New OB Intake today. We discussed her EDD of 05/31/22 that is based on LMP of 08/24/21. Pt is G4/P2012. I reviewed her allergies, medications, Medical/Surgical/OB history, and appropriate screenings. Based on history, this is a high risk pregnancy.  Patient Active Problem List   Diagnosis Date Noted   Prediabetes 12/07/2021   Supervision of high risk pregnancy, antepartum 12/06/2021   AMA (advanced maternal age) multigravida 35+ 12/06/2021   Increased heart rate 09/05/2019   Left carotid bruit 09/05/2019   Eczema 04/16/2019   Hidradenitis 08/01/2017   Sickle cell trait (HCC) 02/14/2017   Alpha thalassemia trait 02/14/2017   Concerns addressed today Patient reports yellow vaginal discharge with an odor like "bad eggs." Offered vaginal self swab if patient can come into office today. Patient agreeable. Reports abdominal pain on right lower abdomen. IUP confirmed. Encouraged pt to present to MAU with any severe abdominal pain or vaginal bleeding.  Delivery Plans Plans to deliver at [facility]. Patient given information for [facility] Healthy Baby website for more information about [facility]. Patient is not interested in water birth.  MyChart/Babyscripts MyChart access verified. Babyscripts to be offered at new OB.  Blood Pressure Cuff Has BP cuff at  home.  Anatomy US Explained first scheduled [place] will be around 19 weeks. Anatomy US scheduled for 01/06/22.  Labs Discussed Avelina Laine genetic screening with patient. Carrier screening completed previously. Needs Panorama, OB panel, A1C.  Is patient a CenteringPregnancy candidate?  Not offered, pt unable to complete full visit due to caring for children.    Is patient a Mom+Baby Combined Care candidate?  Not a candidate; two living children.  Social Determinants of Health Will address at new OB visit. Unable to discuss due to patient caring for children.  First visit review Explained pt will be seen by Mariel Aloe, MD at first visit; encounter routed to appropriate provider. Explained that patient will be seen by pregnancy navigator following visit with provider.   Marjo Bicker, RN 12/07/2021  11:05 AM

## 2021-12-07 DIAGNOSIS — R7303 Prediabetes: Secondary | ICD-10-CM | POA: Insufficient documentation

## 2021-12-07 LAB — POCT URINALYSIS DIP (DEVICE)
Bilirubin Urine: NEGATIVE
Glucose, UA: NEGATIVE mg/dL
Hgb urine dipstick: NEGATIVE
Ketones, ur: NEGATIVE mg/dL
Nitrite: NEGATIVE
Protein, ur: NEGATIVE mg/dL
Specific Gravity, Urine: 1.025 (ref 1.005–1.030)
Urobilinogen, UA: 0.2 mg/dL (ref 0.0–1.0)
pH: 5.5 (ref 5.0–8.0)

## 2021-12-07 LAB — CERVICOVAGINAL ANCILLARY ONLY
Bacterial Vaginitis (gardnerella): POSITIVE — AB
Candida Glabrata: POSITIVE — AB
Candida Vaginitis: POSITIVE — AB
Chlamydia: NEGATIVE
Comment: NEGATIVE
Comment: NEGATIVE
Comment: NEGATIVE
Comment: NEGATIVE
Comment: NEGATIVE
Comment: NORMAL
Neisseria Gonorrhea: NEGATIVE
Trichomonas: NEGATIVE

## 2021-12-07 LAB — CBC/D/PLT+RPR+RH+ABO+RUBIGG...
Antibody Screen: NEGATIVE
Basophils Absolute: 0 10*3/uL (ref 0.0–0.2)
Basos: 1 %
EOS (ABSOLUTE): 0.2 10*3/uL (ref 0.0–0.4)
Eos: 3 %
HCV Ab: NONREACTIVE
HIV Screen 4th Generation wRfx: NONREACTIVE
Hematocrit: 33.4 % — ABNORMAL LOW (ref 34.0–46.6)
Hemoglobin: 11.2 g/dL (ref 11.1–15.9)
Hepatitis B Surface Ag: NEGATIVE
Immature Grans (Abs): 0 10*3/uL (ref 0.0–0.1)
Immature Granulocytes: 0 %
Lymphocytes Absolute: 2 10*3/uL (ref 0.7–3.1)
Lymphs: 32 %
MCH: 26.4 pg — ABNORMAL LOW (ref 26.6–33.0)
MCHC: 33.5 g/dL (ref 31.5–35.7)
MCV: 79 fL (ref 79–97)
Monocytes Absolute: 0.6 10*3/uL (ref 0.1–0.9)
Monocytes: 9 %
Neutrophils Absolute: 3.4 10*3/uL (ref 1.4–7.0)
Neutrophils: 55 %
Platelets: 238 10*3/uL (ref 150–450)
RBC: 4.25 x10E6/uL (ref 3.77–5.28)
RDW: 15.3 % (ref 11.7–15.4)
RPR Ser Ql: NONREACTIVE
Rh Factor: POSITIVE
Rubella Antibodies, IGG: 20 index (ref >0.99)
WBC: 6.1 10*3/uL (ref 3.4–10.8)

## 2021-12-07 LAB — HEMOGLOBIN A1C
Est. average glucose Bld gHb Est-mCnc: 117 mg/dL
Hgb A1c MFr Bld: 5.7 % — ABNORMAL HIGH (ref 4.8–5.6)

## 2021-12-07 LAB — HCV INTERPRETATION

## 2021-12-07 NOTE — Progress Notes (Signed)
Patient arrives later in the afternoon in person for vaginal swab and lab draw. FHR 140. OB labs drawn. Vaginal self swab completed.

## 2021-12-08 ENCOUNTER — Telehealth: Payer: Self-pay

## 2021-12-08 LAB — CULTURE, OB URINE

## 2021-12-08 LAB — URINE CULTURE, OB REFLEX

## 2021-12-08 MED ORDER — METRONIDAZOLE 500 MG PO TABS: 500.0000 mg | ORAL_TABLET | Freq: Two times a day (BID) | ORAL | 0 refills | Status: DC

## 2021-12-08 MED ORDER — TERCONAZOLE 0.4 % VA CREA: 1.0000 | TOPICAL_CREAM | Freq: Every day | VAGINAL | 0 refills | Status: DC

## 2021-12-08 MED ORDER — ONDANSETRON 8 MG PO TBDP
8.0000 mg | ORAL_TABLET | Freq: Three times a day (TID) | ORAL | 0 refills | Status: DC | PRN
Start: 2021-12-08 — End: 2022-01-13

## 2021-12-08 NOTE — Telephone Encounter (Addendum)
-----   Message from Warden Fillers, MD sent at 12/08/2021 10:11 AM EDT ----- Vaginal swab with BV and yeast , offer treatment, prenatal labs otherwise normal  Attempted to call pt unable to leave message due to voicemail box not set up.  MyChart message sent.    Leonette Nutting  12/08/21

## 2021-12-08 NOTE — Telephone Encounter (Signed)
Notified pt results and Flagyl, Terazol cream has been e-prescribed per standing protocol.  Pt asks if she can get a refill on her Zofran but the sublingual one as the tablet makes her vomit.  Zofran ODT 8 mg e-prescribed.    Leonette Nutting  12/08/21

## 2021-12-08 NOTE — Addendum Note (Signed)
Addended by: Faythe Casa on: 12/08/2021 04:47 PM   Modules accepted: Orders

## 2021-12-12 ENCOUNTER — Other Ambulatory Visit: Payer: Self-pay

## 2021-12-12 ENCOUNTER — Ambulatory Visit (INDEPENDENT_AMBULATORY_CARE_PROVIDER_SITE_OTHER): Payer: Medicaid Other | Admitting: Obstetrics and Gynecology

## 2021-12-12 VITALS — BP 114/78 | HR 102 | Wt 177.6 lb

## 2021-12-12 DIAGNOSIS — Z8632 Personal history of gestational diabetes: Secondary | ICD-10-CM

## 2021-12-12 DIAGNOSIS — O0990 Supervision of high risk pregnancy, unspecified, unspecified trimester: Secondary | ICD-10-CM

## 2021-12-12 DIAGNOSIS — O09522 Supervision of elderly multigravida, second trimester: Secondary | ICD-10-CM

## 2021-12-12 DIAGNOSIS — Z3A15 15 weeks gestation of pregnancy: Secondary | ICD-10-CM

## 2021-12-12 DIAGNOSIS — D563 Thalassemia minor: Secondary | ICD-10-CM

## 2021-12-12 DIAGNOSIS — D573 Sickle-cell trait: Secondary | ICD-10-CM

## 2021-12-12 HISTORY — DX: Personal history of gestational diabetes: Z86.32

## 2021-12-12 MED ORDER — ASPIRIN 81 MG PO CHEW: 81.0000 mg | CHEWABLE_TABLET | Freq: Every day | ORAL | 7 refills | Status: DC

## 2021-12-12 NOTE — Progress Notes (Signed)
INITIAL PRENATAL VISIT NOTE  Subjective:  Stacey [name] is a 36 y.o. [id number] at [redacted]w[redacted]d by LMP being seen today for her initial prenatal visit.  She has an obstetric history significant for SVD x 2. She has a medical history significant for gestational diabetes.  Patient reports  back pain with radiation behind her knee consistent with sciatica. .  Contractions: Not present. Vag. Bleeding: None.  Movement: Absent. Denies leaking of fluid.    Past Medical History:  Diagnosis Date   Gestational diabetes    Left carotid bruit 09/05/2019   Left carotid bruit   Normal labor 07/31/2019   Supervision of high risk pregnancy, antepartum 01/20/2019    Nursing Staff Provider  Office Location  [place]  Dating  LMP  Language  English  Anatomy US  Inadequate at [redacted]w[redacted]d, repeat ordered  Flu Vaccine  02/07/19 Genetic Screen  NIPS:  Normal female  AFP:  Neg  First Screen:  Quad:    TDaP vaccine  05/13/19  Hgb A1C or  GTT Early  Third trimester 95 138 104  Rhogam  n/a   LAB RESULTS   Feeding Plan Breast  Blood Type B/Positive/-- (09/11 1133)   Contraception P    Past Surgical History:  Procedure Laterality Date   NO PAST SURGERIES      OB History  Gravida Para Term Preterm AB Living  4 2 2  0 1 2  SAB IAB Ectopic Multiple Live Births  1 0 0 0 2    # Outcome Date GA Lbr Len/2nd Weight Sex Delivery Anes PTL Lv  4 Current           3 Term 08/01/19 [redacted]w[redacted]d 07:07 / 00:04 6 lb 14.6 oz (3.135 kg) M Vag-Spont None  LIV  2 Term 06/27/17 [redacted]w[redacted]d / 00:10 5 lb 15.2 oz (2.699 kg) M Vag-Spont None  LIV  1 SAB             Social History   Socioeconomic History   Marital status: Single    Spouse name: Not on file   Number of children: Not on file   Years of education: Not on file   Highest education level: Not on file  Occupational History   Not on file  Tobacco Use   Smoking status: Never   Smokeless tobacco: Never  Vaping Use   Vaping Use: Never used  Substance and Sexual Activity   Alcohol use: No   Drug use:  No   Sexual activity: Yes    Partners: Male    Birth control/protection: None  Other Topics Concern   Not on file  Social History Narrative   Not on file   Social Determinants of Health   Financial Resource Strain: Not on file  Food Insecurity: No Food Insecurity (12/12/2021)   Hunger Vital Sign    Worried About Running Out of Food in the Last Year: Never true    Ran Out of Food in the Last Year: Never true  Transportation Needs: No Transportation Needs (12/12/2021)   PRAPARE - 12/14/2021 (Medical): No    Lack of Transportation (Non-Medical): No  Physical Activity: Not on file  Stress: Not on file  Social Connections: Not on file    Family History  Problem Relation Age of Onset   Hypertension Mother      Current Outpatient Medications:    acetaminophen (TYLENOL) 325 MG tablet, Take 650 mg by mouth every 6 (six) hours as needed for  moderate pain., Disp: , Rfl:    aspirin 81 MG chewable tablet, Chew 1 tablet (81 mg total) by mouth daily., Disp: 30 tablet, Rfl: 7   metroNIDAZOLE (FLAGYL) 500 MG tablet, Take 1 tablet (500 mg total) by mouth 2 (two) times daily., Disp: 14 tablet, Rfl: 0   ondansetron (ZOFRAN-ODT) 8 MG disintegrating tablet, Take 1 tablet (8 mg total) by mouth every 8 (eight) hours as needed for nausea or vomiting., Disp: 20 tablet, Rfl: 0   prenatal vitamin w/FE, FA (PRENATAL 1 + 1) 27-1 MG TABS tablet, Take 1 tablet by mouth daily at 12 noon., Disp: , Rfl:    promethazine (PHENERGAN) 25 MG tablet, Take 1 tablet (25 mg total) by mouth every 6 (six) hours as needed for nausea or vomiting., Disp: 30 tablet, Rfl: 1   terconazole (TERAZOL 7) 0.4 % vaginal cream, Place 1 applicator vaginally at bedtime., Disp: 45 g, Rfl: 0   metFORMIN (GLUCOPHAGE) 500 MG tablet, Take by mouth 2 (two) times daily with a meal. (Patient not taking: Reported on 12/12/2021), Disp: , Rfl:    ondansetron (ZOFRAN) 8 MG tablet, Take 1 tablet (8 mg total) by mouth every  8 (eight) hours as needed for nausea or vomiting. (Patient not taking: Reported on 12/12/2021), Disp: 20 tablet, Rfl: 1  No Known Allergies  Review of Systems: Negative except for what is mentioned in HPI.  Objective:   Vitals:   12/12/21 1422  BP: 114/78  Pulse: (!) 102  Weight: 177 lb 9.6 oz (80.6 kg)    Fetal Status: Fetal Heart Rate (bpm): 155 Fundal Height: 15 cm Movement: Absent     Physical Exam: BP 114/78   Pulse (!) 102   Wt 177 lb 9.6 oz (80.6 kg)   LMP 08/24/2021   BMI 27.82 kg/m  CONSTITUTIONAL: Well-developed, well-nourished female in no acute distress.  NEUROLOGIC: Alert and oriented to person, place, and time. Normal reflexes, muscle tone coordination. No cranial nerve deficit noted. PSYCHIATRIC: Normal mood and affect. Normal behavior. Normal judgment and thought content. SKIN: Skin is warm and dry. No rash noted. Not diaphoretic. No erythema. No pallor. HENT:  Normocephalic, atraumatic, External right and left ear normal. Oropharynx is clear and moist EYES: Conjunctivae and EOM are normal.  NECK: Normal range of motion, supple, no masses CARDIOVASCULAR: Normal heart rate noted, regular rhythm RESPIRATORY: Effort and breath sounds normal, no problems with respiration noted BREASTS:deferred ABDOMEN: Soft, nontender, nondistended, gravid. [id number] Bimanual: deferred weeks sized uterus, no adnexal tenderness or palpable lesions noted MUSCULOSKELETAL: Normal range of motion. EXT:  No edema and no tenderness. 2+ distal pulses.   Assessment and Plan:  Pregnancy: [id number] at [redacted]w[redacted]d by LMP  1. Supervision of high risk pregnancy, antepartum Continue routine prenatal care AFP today Start baby ASA  - CHL AMB BABYSCRIPTS SCHEDULE OPTIMIZATION - AFP, Serum, Open Spina Bifida  2. [redacted] weeks gestation of pregnancy   3. Sickle cell trait ([place])   4. Multigravida of advanced maternal age in second trimester  - aspirin 81 MG chewable tablet; Chew 1 tablet (81 mg  total) by mouth daily.  Dispense: 30 tablet; Refill: 7  5. Alpha thalassemia trait   6. History of gestational diabetes A1c was 5.7, check 2 hour GTT at 26-28 weeks   Preterm labor symptoms and general obstetric precautions including but not limited to vaginal bleeding, contractions, leaking of fluid and fetal movement were reviewed in detail with the patient.  Please refer to After Visit Summary for other counseling  recommendations.   Return in about 4 weeks (around 01/09/2022) for [facility], in person.  Warden Fillers 12/12/2021 3:15 PM

## 2021-12-12 NOTE — Progress Notes (Signed)
Offered and explained Centering Pregnancy to patient. Patient declines stating she would not be able to commit to the 2 hour time period of appointments.   Alesia Richards, RN 12/12/21

## 2021-12-14 LAB — AFP, SERUM, OPEN SPINA BIFIDA
AFP MoM: 1.07
AFP Value: 34.1 ng/mL
Gest. Age on Collection Date: 15.5 weeks
Maternal Age At EDD: 36.3 yr
OSBR Risk 1 IN: 10000
Test Results:: NEGATIVE
Weight: 177 [lb_av]

## 2021-12-14 LAB — PANORAMA PRENATAL TEST FULL PANEL:PANORAMA TEST PLUS 5 ADDITIONAL MICRODELETIONS: FETAL FRACTION: 10.8

## 2022-01-06 ENCOUNTER — Other Ambulatory Visit: Payer: Self-pay | Admitting: *Deleted

## 2022-01-06 ENCOUNTER — Ambulatory Visit: Payer: Medicaid Other | Attending: Obstetrics and Gynecology

## 2022-01-06 ENCOUNTER — Ambulatory Visit: Payer: Medicaid Other | Admitting: *Deleted

## 2022-01-06 VITALS — BP 114/61 | HR 102

## 2022-01-06 DIAGNOSIS — O0990 Supervision of high risk pregnancy, unspecified, unspecified trimester: Secondary | ICD-10-CM | POA: Diagnosis present

## 2022-01-06 DIAGNOSIS — O09522 Supervision of elderly multigravida, second trimester: Secondary | ICD-10-CM

## 2022-01-13 ENCOUNTER — Ambulatory Visit (INDEPENDENT_AMBULATORY_CARE_PROVIDER_SITE_OTHER): Payer: Medicaid Other | Admitting: Obstetrics and Gynecology

## 2022-01-13 ENCOUNTER — Other Ambulatory Visit: Payer: Self-pay

## 2022-01-13 VITALS — BP 112/62 | HR 92 | Wt 180.1 lb

## 2022-01-13 DIAGNOSIS — Z3A20 20 weeks gestation of pregnancy: Secondary | ICD-10-CM

## 2022-01-13 DIAGNOSIS — O219 Vomiting of pregnancy, unspecified: Secondary | ICD-10-CM

## 2022-01-13 DIAGNOSIS — Z8632 Personal history of gestational diabetes: Secondary | ICD-10-CM

## 2022-01-13 DIAGNOSIS — O0992 Supervision of high risk pregnancy, unspecified, second trimester: Secondary | ICD-10-CM

## 2022-01-13 DIAGNOSIS — O09522 Supervision of elderly multigravida, second trimester: Secondary | ICD-10-CM

## 2022-01-13 DIAGNOSIS — D563 Thalassemia minor: Secondary | ICD-10-CM

## 2022-01-13 DIAGNOSIS — O0990 Supervision of high risk pregnancy, unspecified, unspecified trimester: Secondary | ICD-10-CM

## 2022-01-13 DIAGNOSIS — D573 Sickle-cell trait: Secondary | ICD-10-CM

## 2022-01-13 MED ORDER — ONDANSETRON 8 MG PO TBDP: 8.0000 mg | ORAL_TABLET | Freq: Three times a day (TID) | ORAL | 1 refills | Status: DC | PRN

## 2022-01-13 MED ORDER — PROMETHAZINE HCL 25 MG PO TABS: 25.0000 mg | ORAL_TABLET | Freq: Four times a day (QID) | ORAL | 1 refills | Status: DC | PRN

## 2022-01-13 NOTE — Progress Notes (Signed)
   PRENATAL VISIT NOTE  Subjective:  Stacey [name] is a 36 y.o. [id number] at [redacted]w[redacted]d being seen today for ongoing prenatal care.  She is currently monitored for the following issues for this high-risk pregnancy and has Sickle cell trait (HCC); Alpha thalassemia trait; Hidradenitis; Eczema; Increased heart rate; Left carotid bruit; Supervision of high risk pregnancy, antepartum; AMA (advanced maternal age) multigravida 35+; Prediabetes; and History of gestational diabetes on their problem list.  Patient doing well with no acute concerns today. She reports nausea and vomiting.  Contractions: Not present. Vag. Bleeding: None.  Movement: Present. Denies leaking of fluid.   The following portions of the patient's history were reviewed and updated as appropriate: allergies, current medications, past family history, past medical history, past social history, past surgical history and problem list. Problem list updated.  Objective:   Vitals:   01/13/22 1141  BP: 112/62  Pulse: 92  Weight: 180 lb 1.6 oz (81.7 kg)    Fetal Status: Fetal Heart Rate (bpm): 132 Fundal Height: 20 cm Movement: Present     General:  Alert, oriented and cooperative. Patient is in no acute distress.  Skin: Skin is warm and dry. No rash noted.   Cardiovascular: Normal heart rate noted  Respiratory: Normal respiratory effort, no problems with respiration noted  Abdomen: Soft, gravid, appropriate for gestational age.  Pain/Pressure: Absent     Pelvic: Cervical exam deferred        Extremities: Normal range of motion.  Edema: None  Mental Status:  Normal mood and affect. Normal behavior. Normal judgment and thought content.   Assessment and Plan:  Pregnancy: [id number] at [redacted]w[redacted]d  1. [redacted] weeks gestation of pregnancy   2. Supervision of high risk pregnancy, antepartum Continue routine prenatal care  3. Sickle cell trait (HCC) FOB to be screened  4. History of gestational diabetes A1c 5.7, recheck at 26-28 weeks with  GTT  5. Multigravida of advanced maternal age in second trimester   6. Alpha thalassemia trait   7. Nausea and vomiting during pregnancy Refills of antiemetics sent - ondansetron (ZOFRAN-ODT) 8 MG disintegrating tablet; Take 1 tablet (8 mg total) by mouth every 8 (eight) hours as needed for nausea or vomiting.  Dispense: 20 tablet; Refill: 1 - promethazine (PHENERGAN) 25 MG tablet; Take 1 tablet (25 mg total) by mouth every 6 (six) hours as needed for nausea or vomiting.  Dispense: 30 tablet; Refill: 1  Preterm labor symptoms and general obstetric precautions including but not limited to vaginal bleeding, contractions, leaking of fluid and fetal movement were reviewed in detail with the patient.  Please refer to After Visit Summary for other counseling recommendations.   Return in about 4 weeks (around 02/10/2022) for [facility], in person.   Mariel Aloe, MD Faculty Attending Center for [facility]

## 2022-02-07 ENCOUNTER — Encounter: Payer: Self-pay | Admitting: Advanced Practice Midwife

## 2022-02-14 ENCOUNTER — Ambulatory Visit (INDEPENDENT_AMBULATORY_CARE_PROVIDER_SITE_OTHER): Payer: Medicaid Other | Admitting: Advanced Practice Midwife

## 2022-02-14 ENCOUNTER — Other Ambulatory Visit: Payer: Self-pay

## 2022-02-14 ENCOUNTER — Other Ambulatory Visit: Payer: Medicaid Other

## 2022-02-14 VITALS — BP 111/72 | HR 85 | Wt 185.0 lb

## 2022-02-14 DIAGNOSIS — D563 Thalassemia minor: Secondary | ICD-10-CM

## 2022-02-14 DIAGNOSIS — O0990 Supervision of high risk pregnancy, unspecified, unspecified trimester: Secondary | ICD-10-CM

## 2022-02-14 DIAGNOSIS — R7303 Prediabetes: Secondary | ICD-10-CM

## 2022-02-14 DIAGNOSIS — O219 Vomiting of pregnancy, unspecified: Secondary | ICD-10-CM

## 2022-02-14 DIAGNOSIS — Z23 Encounter for immunization: Secondary | ICD-10-CM

## 2022-02-14 DIAGNOSIS — Z3A24 24 weeks gestation of pregnancy: Secondary | ICD-10-CM

## 2022-02-14 DIAGNOSIS — D573 Sickle-cell trait: Secondary | ICD-10-CM

## 2022-02-14 DIAGNOSIS — O09522 Supervision of elderly multigravida, second trimester: Secondary | ICD-10-CM

## 2022-02-14 MED ORDER — ONDANSETRON 8 MG PO TBDP: 8.0000 mg | ORAL_TABLET | Freq: Three times a day (TID) | ORAL | 3 refills | Status: DC | PRN

## 2022-02-14 NOTE — Progress Notes (Signed)
   PRENATAL VISIT NOTE  Subjective:  Stacey [name] is a 36 y.o. [phone] at [redacted]w[redacted]d being seen today for ongoing prenatal care.  She is currently monitored for the following issues for this high-risk pregnancy and has Sickle cell trait ([place]); Alpha thalassemia trait; Hidradenitis; Eczema; Increased heart rate; Left carotid bruit; Supervision of high risk pregnancy, antepartum; AMA (advanced maternal age) multigravida 35+; Prediabetes; and History of gestational diabetes on their problem list.  Patient reports nausea, vomiting, and calf cramps at night .  Contractions: Not present. Vag. Bleeding: None.  Movement: Present. Denies leaking of fluid.   The following portions of the patient's history were reviewed and updated as appropriate: allergies, current medications, past family history, past medical history, past social history, past surgical history and problem list.   Objective:   Vitals:   02/14/22 0853  BP: 111/72  Pulse: 85  Weight: 185 lb (83.9 kg)    Fetal Status: Fetal Heart Rate (bpm): 150   Movement: Present     General:  Alert, oriented and cooperative. Patient is in no acute distress.  Skin: Skin is warm and dry. No rash noted.   Cardiovascular: Normal heart rate noted  Respiratory: Normal respiratory effort, no problems with respiration noted  Abdomen: Soft, gravid, appropriate for gestational age.  Pain/Pressure: Present     Pelvic: Cervical exam deferred        Extremities: Normal range of motion.  Edema: None  Mental Status: Normal mood and affect. Normal behavior. Normal judgment and thought content.   Assessment and Plan:  Pregnancy: [id number] at [redacted]w[redacted]d 1. Supervision of high risk pregnancy, antepartum - 28 week labs rescheduled due to not fasting  - Flu vaccine  2. Alpha thalassemia trait - Partner test given again  3. Multigravida of advanced maternal age in second trimester - Antenatal testing per MFM  4. Sickle cell trait ([place]) - Partner test given  again  5. Prediabetes - GTT rescheudled  6. [redacted] weeks gestation of pregnancy  7. N/V or pregnancy  - Refill Zofran  8. Leg ramps - Magnesium Citrate OTC  Preterm labor symptoms and general obstetric precautions including but not limited to vaginal bleeding, contractions, leaking of fluid and fetal movement were reviewed in detail with the patient. Please refer to After Visit Summary for other counseling recommendations.   No follow-ups on file.  Future Appointments  Date Time Provider [place]  02/17/2022  1:15 PM [facility] NURSE [facility]  02/17/2022  1:30 PM [facility] US2 [facility] [place], CNM

## 2022-02-14 NOTE — Patient Instructions (Addendum)
Magnesium Citrate for leg cramps  TDaP Vaccine Pregnancy Get the Whooping Cough Vaccine While You Are Pregnant (CDC)  It is important for women to get the whooping cough vaccine in the third trimester of each pregnancy. Vaccines are the best way to prevent this disease. There are 2 different whooping cough vaccines. Both vaccines combine protection against whooping cough, tetanus and diphtheria, but they are for different age groups: Tdap: for everyone 11 years or older, including pregnant women  DTaP: for children 2 months through 64 years of age  You need the whooping cough vaccine during each of your pregnancies The recommended time to get the shot is during your 27th through 36th week of pregnancy, preferably during the earlier part of this time period. The Centers for Disease Control and Prevention (CDC) recommends that pregnant women receive the whooping cough vaccine for adolescents and adults (called Tdap vaccine) during the third trimester of each pregnancy. The recommended time to get the shot is during your 27th through 36th week of pregnancy, preferably during the earlier part of this time period. This replaces the original recommendation that pregnant women get the vaccine only if they had not previously received it. The [employer] of Obstetricians and Gynecologists and the [employer] support this recommendation.  You should get the whooping cough vaccine while pregnant to pass protection to your baby frame support disabled and/or not supported in this browser  Learn why Mickel Baas decided to get the whooping cough vaccine in her 3rd trimester of pregnancy and how her baby girl was born with some protection against the disease. Also available on YouTube. After receiving the whooping cough vaccine, your body will create protective antibodies (proteins produced by the body to fight off diseases) and pass some of them to your baby before birth. These antibodies  provide your baby some short-term protection against whooping cough in early life. These antibodies can also protect your baby from some of the more serious complications that come along with whooping cough. Your protective antibodies are at their highest about 2 weeks after getting the vaccine, but it takes time to pass them to your baby. So the preferred time to get the whooping cough vaccine is early in your third trimester. The amount of whooping cough antibodies in your body decreases over time. That is why CDC recommends you get a whooping cough vaccine during each pregnancy. Doing so allows each of your babies to get the greatest number of protective antibodies from you. This means each of your babies will get the best protection possible against this disease.  Getting the whooping cough vaccine while pregnant is better than getting the vaccine after you give birth Whooping cough vaccination during pregnancy is ideal so your baby will have short-term protection as soon as he is born. This early protection is important because your baby will not start getting his whooping cough vaccines until he is 2 months old. These first few months of life are when your baby is at greatest risk for catching whooping cough. This is also when he's at greatest risk for having severe, potentially life-threating complications from the infection. To avoid that gap in protection, it is best to get a whooping cough vaccine during pregnancy. You will then pass protection to your baby before he is born. To continue protecting your baby, he should get whooping cough vaccines starting at 2 months old. You may never have gotten the Tdap vaccine before and did not get it during this pregnancy.  If so, you should make sure to get the vaccine immediately after you give birth, before leaving the hospital or birthing center. It will take about 2 weeks before your body develops protection (antibodies) in response to the vaccine. Once you  have protection from the vaccine, you are less likely to give whooping cough to your newborn while caring for him. But remember, your baby will still be at risk for catching whooping cough from others. A recent study looked to see how effective Tdap was at preventing whooping cough in babies whose mothers got the vaccine while pregnant or in the hospital after giving birth. The study found that getting Tdap between 27 through 36 weeks of pregnancy is 85% more effective at preventing whooping cough in babies younger than 2 months old. Blood tests cannot tell if you need a whooping cough vaccine There are no blood tests that can tell you if you have enough antibodies in your body to protect yourself or your baby against whooping cough. Even if you have been sick with whooping cough in the past or previously received the vaccine, you still should get the vaccine during each pregnancy. Breastfeeding may pass some protective antibodies onto your baby By breastfeeding, you may pass some antibodies you have made in response to the vaccine to your baby. When you get a whooping cough vaccine during your pregnancy, you will have antibodies in your breast milk that you can share with your baby as soon as your milk comes in. However, your baby will not get protective antibodies immediately if you wait to get the whooping cough vaccine until after delivering your baby. This is because it takes about 2 weeks for your body to create antibodies. Learn more about the health benefits of breastfeeding.

## 2022-02-15 LAB — CBC
Hematocrit: 32.5 % — ABNORMAL LOW (ref 34.0–46.6)
Hemoglobin: 10.4 g/dL — ABNORMAL LOW (ref 11.1–15.9)
MCH: 25.8 pg — ABNORMAL LOW (ref 26.6–33.0)
MCHC: 32 g/dL (ref 31.5–35.7)
MCV: 81 fL (ref 79–97)
Platelets: 288 10*3/uL (ref 150–450)
RBC: 4.03 x10E6/uL (ref 3.77–5.28)
RDW: 13.4 % (ref 11.7–15.4)
WBC: 6.7 10*3/uL (ref 3.4–10.8)

## 2022-02-15 LAB — HIV ANTIBODY (ROUTINE TESTING W REFLEX): HIV Screen 4th Generation wRfx: NONREACTIVE

## 2022-02-15 LAB — RPR: RPR Ser Ql: NONREACTIVE

## 2022-02-17 ENCOUNTER — Other Ambulatory Visit: Payer: Self-pay | Admitting: *Deleted

## 2022-02-17 ENCOUNTER — Ambulatory Visit: Payer: Medicaid Other | Admitting: *Deleted

## 2022-02-17 ENCOUNTER — Other Ambulatory Visit: Payer: Medicaid Other

## 2022-02-17 ENCOUNTER — Other Ambulatory Visit: Payer: Self-pay

## 2022-02-17 ENCOUNTER — Ambulatory Visit: Payer: Medicaid Other | Attending: Obstetrics

## 2022-02-17 VITALS — BP 115/56 | HR 104

## 2022-02-17 DIAGNOSIS — O0990 Supervision of high risk pregnancy, unspecified, unspecified trimester: Secondary | ICD-10-CM | POA: Diagnosis present

## 2022-02-17 DIAGNOSIS — Z3A25 25 weeks gestation of pregnancy: Secondary | ICD-10-CM | POA: Diagnosis not present

## 2022-02-17 DIAGNOSIS — O09522 Supervision of elderly multigravida, second trimester: Secondary | ICD-10-CM

## 2022-02-17 DIAGNOSIS — Z148 Genetic carrier of other disease: Secondary | ICD-10-CM

## 2022-02-18 LAB — GLUCOSE TOLERANCE, 2 HOURS W/ 1HR
Glucose, 1 hour: 113 mg/dL (ref 70–179)
Glucose, 2 hour: 116 mg/dL (ref 70–152)
Glucose, Fasting: 80 mg/dL (ref 70–91)

## 2022-02-28 ENCOUNTER — Other Ambulatory Visit: Payer: Self-pay

## 2022-02-28 ENCOUNTER — Ambulatory Visit (INDEPENDENT_AMBULATORY_CARE_PROVIDER_SITE_OTHER): Payer: Medicaid Other | Admitting: Family Medicine

## 2022-02-28 VITALS — BP 108/61 | HR 101 | Wt 181.9 lb

## 2022-02-28 DIAGNOSIS — R634 Abnormal weight loss: Secondary | ICD-10-CM

## 2022-02-28 DIAGNOSIS — Z23 Encounter for immunization: Secondary | ICD-10-CM

## 2022-02-28 DIAGNOSIS — O0990 Supervision of high risk pregnancy, unspecified, unspecified trimester: Secondary | ICD-10-CM

## 2022-02-28 DIAGNOSIS — O09522 Supervision of elderly multigravida, second trimester: Secondary | ICD-10-CM

## 2022-02-28 DIAGNOSIS — D563 Thalassemia minor: Secondary | ICD-10-CM

## 2022-02-28 DIAGNOSIS — Z3A26 26 weeks gestation of pregnancy: Secondary | ICD-10-CM

## 2022-02-28 DIAGNOSIS — D573 Sickle-cell trait: Secondary | ICD-10-CM

## 2022-02-28 MED ORDER — ACCU-CHEK GUIDE W/DEVICE KIT: 1.0000 | PACK | Freq: Once | 0 refills | Status: AC

## 2022-02-28 MED ORDER — ACCU-CHEK SOFTCLIX LANCETS MISC: 12 refills | Status: DC

## 2022-03-04 NOTE — Progress Notes (Signed)
   PRENATAL VISIT NOTE  Subjective:  Stacey [name] is a 36 y.o. [phone] at [redacted]w[redacted]d being seen today for ongoing prenatal care.  She is currently monitored for the following issues for this high-risk pregnancy and has Sickle cell trait ([place]); Alpha thalassemia trait; Hidradenitis; Eczema; Increased heart rate; Left carotid bruit; Supervision of high risk pregnancy, antepartum; AMA (advanced maternal age) multigravida 35+; Prediabetes; and History of gestational diabetes on their problem list.  Patient reports no bleeding, no contractions, no cramping, and no leaking.  Contractions: Not present. Vag. Bleeding: None.  Movement: Present. Denies leaking of fluid.   The following portions of the patient's history were reviewed and updated as appropriate: allergies, current medications, past family history, past medical history, past social history, past surgical history and problem list.   Objective:   Vitals:   02/28/22 1635  BP: 108/61  Pulse: (!) 101  Weight: 181 lb 14.4 oz (82.5 kg)    Fetal Status: Fetal Heart Rate (bpm): 145   Movement: Present     General:  Alert, oriented and cooperative. Patient is in no acute distress.  Skin: Skin is warm and dry. No rash noted.   Cardiovascular: Normal heart rate noted  Respiratory: Normal respiratory effort, no problems with respiration noted  Abdomen: Soft, gravid, appropriate for gestational age.  Pain/Pressure: Present     Pelvic: Cervical exam deferred        Extremities: Normal range of motion.  Edema: None  Mental Status: Normal mood and affect. Normal behavior. Normal judgment and thought content.   Assessment and Plan:  Pregnancy: [id number] at [redacted]w[redacted]d 1. Supervision of high risk pregnancy, antepartum Routine prenatal care.  Of note patient reports that she threw up her glucose challenge rather quickly after drinking the fluids.  Concerned that this may be a false negative because she did not consume all of the glucose.  Patient request that  she not repeat the test but would rather just monitor her blood sugars.  Blood glucose supply sent to pharmacy.  2. Weight loss Continue Zofran as needed.  Phenergan at night if needed.  Continue to monitor.  3. Alpha thalassemia trait Partner test has been given  4. Multigravida of advanced maternal age in second trimester  5. Sickle cell trait ([facility])  6. [redacted] weeks gestation of pregnancy  Preterm labor symptoms and general obstetric precautions including but not limited to vaginal bleeding, contractions, leaking of fluid and fetal movement were reviewed in detail with the patient. Please refer to After Visit Summary for other counseling recommendations.   No follow-ups on file.  Future Appointments  Date Time Provider [place]  03/08/2022  3:20 PM [facility] NURSE [facility]  03/17/2022  3:15 PM [facility] NURSE [facility]  03/17/2022  3:30 PM [facility] US3 [facility]  03/24/2022  9:15 AM Radene Gunning, MD [facility]  04/14/2022  3:15 PM [facility] NURSE [facility]  04/14/2022  3:30 PM [facility] US3 [facility] [place]    Concepcion Living, MD

## 2022-03-08 ENCOUNTER — Other Ambulatory Visit: Payer: Self-pay

## 2022-03-08 ENCOUNTER — Other Ambulatory Visit (HOSPITAL_COMMUNITY)
Admission: RE | Admit: 2022-03-08 | Discharge: 2022-03-08 | Disposition: A | Discharge status: Home / Self Care | Payer: Medicaid Other | Source: Ambulatory Visit | Attending: Family Medicine | Admitting: Family Medicine

## 2022-03-08 ENCOUNTER — Ambulatory Visit (INDEPENDENT_AMBULATORY_CARE_PROVIDER_SITE_OTHER): Payer: Medicaid Other | Admitting: General Practice

## 2022-03-08 VITALS — BP 114/65 | HR 98 | Ht 62.0 in | Wt 187.0 lb

## 2022-03-08 DIAGNOSIS — R102 Pelvic and perineal pain: Secondary | ICD-10-CM

## 2022-03-08 DIAGNOSIS — O26893 Other specified pregnancy related conditions, third trimester: Secondary | ICD-10-CM | POA: Diagnosis present

## 2022-03-08 NOTE — Progress Notes (Signed)
Patient presents to office today for blood sugar check & weight check. She has been losing weight due to nausea/vomiting. 6lb weight gain today compared to Ob visit from last week. She reports her fasting blood sugars have been in the 90s & 2 hr pp in the 100s. Patient reports 3 episodes of severe stomach pain this week that feels like "an upset stomach". She reports regular bowel movements, drinks a lot of water a day, & denies feeling contractions. She has good fetal movement. When this happens, she lies down & takes tylenol. When she wakes up, the pain is gone. Discussed with Dr Ernestina Patches who recommends vaginal cultures. Patient was instructed in self swab & specimen collected. Advised results will be back in 24-48 hours and available via mychart. Precautions given of when to go to MAU.   Koren Bound RN BSN 03/08/22

## 2022-03-09 LAB — CERVICOVAGINAL ANCILLARY ONLY
Bacterial Vaginitis (gardnerella): NEGATIVE
Candida Glabrata: POSITIVE — AB
Candida Vaginitis: NEGATIVE
Chlamydia: NEGATIVE
Comment: NEGATIVE
Comment: NEGATIVE
Comment: NEGATIVE
Comment: NEGATIVE
Comment: NEGATIVE
Comment: NORMAL
Neisseria Gonorrhea: NEGATIVE
Trichomonas: NEGATIVE

## 2022-03-16 ENCOUNTER — Other Ambulatory Visit: Payer: Self-pay

## 2022-03-16 DIAGNOSIS — B3731 Acute candidiasis of vulva and vagina: Secondary | ICD-10-CM

## 2022-03-16 MED ORDER — TERCONAZOLE 0.8 % VA CREA: 1.0000 | TOPICAL_CREAM | Freq: Every day | VAGINAL | 0 refills | Status: AC

## 2022-03-17 ENCOUNTER — Ambulatory Visit (HOSPITAL_BASED_OUTPATIENT_CLINIC_OR_DEPARTMENT_OTHER): Payer: Medicaid Other | Admitting: *Deleted

## 2022-03-17 ENCOUNTER — Ambulatory Visit (HOSPITAL_BASED_OUTPATIENT_CLINIC_OR_DEPARTMENT_OTHER): Payer: Medicaid Other | Admitting: Maternal & Fetal Medicine

## 2022-03-17 ENCOUNTER — Ambulatory Visit: Payer: Medicaid Other | Admitting: *Deleted

## 2022-03-17 ENCOUNTER — Ambulatory Visit: Payer: Medicaid Other | Attending: Maternal & Fetal Medicine

## 2022-03-17 VITALS — BP 125/62 | HR 107

## 2022-03-17 DIAGNOSIS — O365990 Maternal care for other known or suspected poor fetal growth, unspecified trimester, not applicable or unspecified: Secondary | ICD-10-CM

## 2022-03-17 DIAGNOSIS — O09523 Supervision of elderly multigravida, third trimester: Secondary | ICD-10-CM | POA: Diagnosis present

## 2022-03-17 DIAGNOSIS — O09522 Supervision of elderly multigravida, second trimester: Secondary | ICD-10-CM | POA: Insufficient documentation

## 2022-03-17 DIAGNOSIS — Z362 Encounter for other antenatal screening follow-up: Secondary | ICD-10-CM | POA: Diagnosis not present

## 2022-03-17 DIAGNOSIS — O0990 Supervision of high risk pregnancy, unspecified, unspecified trimester: Secondary | ICD-10-CM | POA: Insufficient documentation

## 2022-03-17 DIAGNOSIS — Z148 Genetic carrier of other disease: Secondary | ICD-10-CM | POA: Diagnosis not present

## 2022-03-17 DIAGNOSIS — Z3A29 29 weeks gestation of pregnancy: Secondary | ICD-10-CM | POA: Insufficient documentation

## 2022-03-17 NOTE — Progress Notes (Signed)
MFM Consult Note Patient Name: Stacey [name]  Patient MRN:   [id number]  Referring provider: [facility]  Reason for Consult: FGR   HPI: Stacey [name] is a 36 y.o. [id number] at [redacted]w[redacted]d  here for ultrasound and consultation.   Counseling  I discussed the finding of fetal growth restriction (FGR) with the patient today. The ultrasound shows an overall growth at the 3.5 percentile and the abdominal circumference at the 4th percentile. The umbilical artery Dopplers are normal. NST was reactive. I counseled her about the clinical significance of the Doppler findings and antenatal testing. I discussed the various causes of growth restriction including constitutionally small fetus, placental insufficiency, genetic problems and chronic maternal disease. Currently there is no evidence of sonographic stigmata suggesting infection or aneuploidy.  I discussed the most likely cause of her fetal growth restriction is either a constitutionally small fetus or placental insufficiency. We discussed it is often challenging to differentiate between a fetus that is constitutionally small but is fulfilling its growth potential and a fetus that is not fulfilling its growth potential because of an underlying pathologic condition. Approximately 70% of fetuses with birth weight below the 10th percentile for gestational age are constitutionally small; in the remaining 30%, the cause of the small size is pathologic, meeting intrauterine growth restriction definition. I also discussed the importance of antenatal fetal surveillance including antenatal testing and umbilical artery Doppler assessment to reduce the risk of stillbirth.  I discussed the management going forward in the pregnancy with potential alteration in the timing of delivery. Currently she feels well and denies headache, vision changes, right upper quadrant pain, contractions, vaginal bleeding or loss of fluid.  She reports good fetal movement.   Review of Systems: A review of  systems was performed and was negative except per HPI   Vitals and Physical Exam See intake sheet for vitals Sitting comfortably on the sonogram table Nonlabored breathing Normal rate and rhythm Abdomen is nontender  Sonographic findings Single intrauterine pregnancy at 29w 2d.  Observed fetal cardiac activity. Breech presentation. Interval fetal anatomy appears normal.  Fetal biometry shows the estimated fetal weight at the 3.3 percentile.  Amniotic fluid volume: Within normal limits. AFI: 17.4 cm.  MVP: 5.9 cm. Placenta is Anterior. BPP is .  Umbilical artery dopplers findings: -S/D:2.9 which are normal at this gestational age.  -Absent end-diastolic flow: No.  -Reversed end-diastolic flow:  No.  Assessment/Plan FGR - Continue weekly UA dopplers until delivery.  - Continue to avoid tobacco use and exposure to harmful substances - Weekly NST until 28 weeks - Weekly BPP to start at 32 weeks. Add twice weekly NST if UA Dopplers have peroids of absent end diastolic flow to allow for twice weekly antenatal assessment or if EFW/AC <3%.  - Betamethasone indicated if absent or reversed flow is seen or antenatal testing is abnormal in the future  - Serial growth [place] every 3 weeks until delivery  - Delivery likley around 37 weeks or sooner if inidcated.  - With cases of early onset growth restriction with onset at less than 32 weeks, amniocentesis to assess chromosomal MicroArray and CMV is recommended.  The patient was offered this and declined.   The patient had time to ask questions which were answered to her satisfaction.  She verbalized understanding and request to proceed with the plan. I spent 20 minutes total in patient care including chart review and counseling with the patient.  [place] [facility]  03/17/2022  4:32 PM

## 2022-03-17 NOTE — Procedures (Signed)
Stacey [name] 1986/03/04 [redacted]w[redacted]d  Fetus A Non-Stress Test Interpretation for 03/17/22  Indication: IUGR and Advanced Maternal Age >40 years  Fetal Heart Rate A Mode: External Baseline Rate (A): 140 bpm Variability: Moderate Accelerations: 15 x 15 Decelerations: None Multiple birth?: No  Uterine Activity Mode: Toco Contraction Frequency (min): none Resting Tone Palpated: Relaxed  Interpretation (Fetal Testing) Nonstress Test Interpretation: Reactive Overall Impression: Reassuring for gestational age Comments: Tracing reviewed by Dr. Epimenio Sarin

## 2022-03-20 ENCOUNTER — Other Ambulatory Visit: Payer: Self-pay | Admitting: *Deleted

## 2022-03-20 DIAGNOSIS — O09523 Supervision of elderly multigravida, third trimester: Secondary | ICD-10-CM

## 2022-03-22 ENCOUNTER — Ambulatory Visit: Payer: Medicaid Other

## 2022-03-22 ENCOUNTER — Other Ambulatory Visit: Payer: Medicaid Other

## 2022-03-22 DIAGNOSIS — O365990 Maternal care for other known or suspected poor fetal growth, unspecified trimester, not applicable or unspecified: Secondary | ICD-10-CM | POA: Insufficient documentation

## 2022-03-22 NOTE — Progress Notes (Deleted)
OBSTETRICS PRENATAL VIRTUAL VISIT ENCOUNTER NOTE  Provider location: [facility] [place] [facility]   Patient location: Home  I connected with [name] on 03/22/22 at  9:15 AM EDT by MyChart Video Encounter and verified that I am speaking with the correct person using two identifiers. I discussed the limitations, risks, security and privacy concerns of performing an evaluation and management service virtually and the availability of in person appointments. I also discussed with the patient that there may be a patient responsible charge related to this service. The patient expressed understanding and agreed to proceed. Subjective:  Stacey [name] is a 36 y.o. [phone] at [redacted]w[redacted]d being seen today for ongoing prenatal care.  She is currently monitored for the following issues for this high-risk pregnancy and has Sickle cell trait ([place]); Alpha thalassemia trait; Hidradenitis; Eczema; Increased heart rate; Left carotid bruit; Supervision of high risk pregnancy, antepartum; AMA (advanced maternal age) multigravida 35+; Prediabetes; History of gestational diabetes; and Fetal growth restriction antepartum on their problem list.  Patient reports {[id number]}.   .  .   . Denies any leaking of fluid.   The following portions of the patient's history were reviewed and updated as appropriate: allergies, current medications, past family history, past medical history, past social history, past surgical history and problem list.   Objective:  There were no vitals filed for this visit.  Fetal Status:           General:  Alert, oriented and cooperative. Patient is in no acute distress.  Respiratory: Normal respiratory effort, no problems with respiration noted  Mental Status: Normal mood and affect. Normal behavior. Normal judgment and thought content.  Rest of physical exam deferred due to type of encounter  Imaging: No results found.  Assessment and Plan:  Pregnancy: [id number] at  [redacted]w[redacted]d 1. Supervision of high risk pregnancy, antepartum TDAP, flu shot given previously. Rh pos MOF: *** MOC: ***  2. Multigravida of advanced maternal age in second trimester LR nips  3. History of gestational diabetes 2 hr was normal   4. Fetal growth restriction antepartum Has EFW 3.5%, AC 4%ile. Normal UADs. Plan is for weekly dopplers, BPP at 32w. Delivery likely at [place]. See Dr. Faythe Ghee note from 10/20 for details.   Preterm labor symptoms and general obstetric precautions including but not limited to vaginal bleeding, contractions, leaking of fluid and fetal movement were reviewed in detail with the patient. I discussed the assessment and treatment plan with the patient. The patient was provided an opportunity to ask questions and all were answered. The patient agreed with the plan and demonstrated an understanding of the instructions. The patient was advised to call back or seek an in-person office evaluation/go to [facility]U at [facility] for any urgent or concerning symptoms. Please refer to After Visit Summary for other counseling recommendations.   I provided *** minutes of face-to-face time during this encounter.  No follow-ups on file.  Future Appointments  Date Time Provider [place]  03/24/2022  9:15 AM Radene Gunning, MD [facility]  03/24/2022 10:30 AM [facility] NURSE [facility]  03/24/2022 10:45 AM [facility] US4 [facility]  03/24/2022  1:15 PM [facility] NST [facility]  03/31/2022  2:15 PM [facility] NURSE [facility]  03/31/2022  2:30 PM [facility] US3 [facility]  03/31/2022  3:15 PM [facility] NST [facility]  04/07/2022  8:30 AM [facility] NURSE [facility]  04/07/2022  8:45 AM [facility] US6 [facility]  04/07/2022  9:45  AM [facility] NST [facility]  04/14/2022  3:15 PM [facility] NURSE [facility]  04/14/2022  3:30 PM [facility] US3 [facility]    Milas Hock, MD Center for [facility]

## 2022-03-24 ENCOUNTER — Ambulatory Visit (HOSPITAL_BASED_OUTPATIENT_CLINIC_OR_DEPARTMENT_OTHER): Payer: Medicaid Other | Admitting: *Deleted

## 2022-03-24 ENCOUNTER — Telehealth: Payer: Medicaid Other | Admitting: Obstetrics and Gynecology

## 2022-03-24 ENCOUNTER — Ambulatory Visit: Payer: Medicaid Other | Attending: Maternal & Fetal Medicine

## 2022-03-24 ENCOUNTER — Encounter: Payer: Medicaid Other | Admitting: Obstetrics and Gynecology

## 2022-03-24 ENCOUNTER — Other Ambulatory Visit: Payer: Medicaid Other

## 2022-03-24 ENCOUNTER — Ambulatory Visit: Payer: Medicaid Other

## 2022-03-24 ENCOUNTER — Ambulatory Visit: Payer: Medicaid Other | Admitting: *Deleted

## 2022-03-24 VITALS — BP 123/59 | HR 118

## 2022-03-24 DIAGNOSIS — Z3A30 30 weeks gestation of pregnancy: Secondary | ICD-10-CM | POA: Insufficient documentation

## 2022-03-24 DIAGNOSIS — O09522 Supervision of elderly multigravida, second trimester: Secondary | ICD-10-CM

## 2022-03-24 DIAGNOSIS — O365931 Maternal care for other known or suspected poor fetal growth, third trimester, fetus 1: Secondary | ICD-10-CM | POA: Diagnosis present

## 2022-03-24 DIAGNOSIS — Z8632 Personal history of gestational diabetes: Secondary | ICD-10-CM

## 2022-03-24 DIAGNOSIS — O0990 Supervision of high risk pregnancy, unspecified, unspecified trimester: Secondary | ICD-10-CM

## 2022-03-24 DIAGNOSIS — O365990 Maternal care for other known or suspected poor fetal growth, unspecified trimester, not applicable or unspecified: Secondary | ICD-10-CM

## 2022-03-24 DIAGNOSIS — O09523 Supervision of elderly multigravida, third trimester: Secondary | ICD-10-CM

## 2022-03-24 NOTE — Procedures (Signed)
Symphoni Helbling 09-Mar-1986 [redacted]w[redacted]d  Fetus A Non-Stress Test Interpretation for 03/24/22  Indication: IUGR and Advanced Maternal Age >40 years  Fetal Heart Rate A Mode: External Baseline Rate (A): 145 bpm Variability: Moderate Accelerations: 10 x 10 Decelerations: None Multiple birth?: No  Uterine Activity Mode: Toco Contraction Frequency (min): none Contraction Quality: Mild Resting Tone Palpated: Relaxed  Interpretation (Fetal Testing) Nonstress Test Interpretation: Reactive Overall Impression: Reassuring for gestational age Comments: tracing reviewed by Dr. Donalee Citrin

## 2022-03-31 ENCOUNTER — Ambulatory Visit: Payer: Medicaid Other | Admitting: *Deleted

## 2022-03-31 ENCOUNTER — Ambulatory Visit (HOSPITAL_BASED_OUTPATIENT_CLINIC_OR_DEPARTMENT_OTHER): Payer: Medicaid Other | Admitting: *Deleted

## 2022-03-31 ENCOUNTER — Ambulatory Visit: Payer: Medicaid Other | Attending: Maternal & Fetal Medicine

## 2022-03-31 VITALS — BP 122/55 | HR 122

## 2022-03-31 DIAGNOSIS — O09523 Supervision of elderly multigravida, third trimester: Secondary | ICD-10-CM | POA: Diagnosis present

## 2022-03-31 DIAGNOSIS — O0990 Supervision of high risk pregnancy, unspecified, unspecified trimester: Secondary | ICD-10-CM | POA: Insufficient documentation

## 2022-03-31 DIAGNOSIS — O365930 Maternal care for other known or suspected poor fetal growth, third trimester, not applicable or unspecified: Secondary | ICD-10-CM

## 2022-03-31 DIAGNOSIS — Z3A31 31 weeks gestation of pregnancy: Secondary | ICD-10-CM

## 2022-03-31 NOTE — Procedures (Signed)
Stacey [name] 06-23-1985 [redacted]w[redacted]d  Fetus A Non-Stress Test Interpretation for 03/31/22  Indication: IUGR  Fetal Heart Rate A Mode: External Baseline Rate (A): 140 bpm Variability: Moderate Accelerations: 15 x 15 Decelerations: None Multiple birth?: No  Uterine Activity Mode: Palpation, Toco Contraction Frequency (min): ui Resting Tone Palpated: Relaxed  Interpretation (Fetal Testing) Nonstress Test Interpretation: Reactive Overall Impression: Reassuring for gestational age Comments: D. Annamaria Boots reviewed tracing

## 2022-04-03 ENCOUNTER — Encounter: Payer: Self-pay | Admitting: Obstetrics and Gynecology

## 2022-04-03 ENCOUNTER — Ambulatory Visit (INDEPENDENT_AMBULATORY_CARE_PROVIDER_SITE_OTHER): Payer: Medicaid Other | Admitting: Obstetrics and Gynecology

## 2022-04-03 ENCOUNTER — Other Ambulatory Visit: Payer: Self-pay

## 2022-04-03 VITALS — BP 118/67 | HR 116 | Wt 192.2 lb

## 2022-04-03 DIAGNOSIS — O365930 Maternal care for other known or suspected poor fetal growth, third trimester, not applicable or unspecified: Secondary | ICD-10-CM

## 2022-04-03 DIAGNOSIS — Z3A31 31 weeks gestation of pregnancy: Secondary | ICD-10-CM

## 2022-04-03 DIAGNOSIS — R0989 Other specified symptoms and signs involving the circulatory and respiratory systems: Secondary | ICD-10-CM

## 2022-04-03 DIAGNOSIS — O365990 Maternal care for other known or suspected poor fetal growth, unspecified trimester, not applicable or unspecified: Secondary | ICD-10-CM

## 2022-04-03 DIAGNOSIS — O09523 Supervision of elderly multigravida, third trimester: Secondary | ICD-10-CM

## 2022-04-04 ENCOUNTER — Encounter: Payer: Self-pay | Admitting: Obstetrics and Gynecology

## 2022-04-04 NOTE — Progress Notes (Signed)
   PRENATAL VISIT NOTE  Subjective:  Stacey [name] is a 36 y.o. [phone] at [redacted]w[redacted]d being seen today for ongoing prenatal care.  She is currently monitored for the following issues for this high-risk pregnancy and has Sickle cell trait ([place]); Alpha thalassemia trait; Hidradenitis; Left carotid bruit; Supervision of high risk pregnancy, antepartum; AMA (advanced maternal age) multigravida 35+; Prediabetes; and Fetal growth restriction antepartum on their problem list.  Patient reports no complaints.  Contractions: Not present. Vag. Bleeding: None.  Movement: Present. Denies leaking of fluid.   The following portions of the patient's history were reviewed and updated as appropriate: allergies, current medications, past family history, past medical history, past social history, past surgical history and problem list.   Objective:   Vitals:   04/03/22 1125  BP: 118/67  Pulse: (!) 116  Weight: 192 lb 3.2 oz (87.2 kg)    Fetal Status: Fetal Heart Rate (bpm): 147   Movement: Present     General:  Alert, oriented and cooperative. Patient is in no acute distress.  Skin: Skin is warm and dry. No rash noted.   Cardiovascular: Normal heart rate noted  Respiratory: Normal respiratory effort, no problems with respiration noted  Abdomen: Soft, gravid, appropriate for gestational age.  Pain/Pressure: Absent     Pelvic: Cervical exam deferred        Extremities: Normal range of motion.  Edema: None  Mental Status: Normal mood and affect. Normal behavior. Normal judgment and thought content.   Assessment and Plan:  Pregnancy: [id number] at [redacted]w[redacted]d 1. [redacted] weeks gestation of pregnancy D/w her re: contraception next visit  2. Fetal growth restriction antepartum FGR 3.9% dx at 30w 11/3:  ua wnl, afi 20 10/20: 3.3%, 1096g, ac 4%, afi 17, ua wnl  D/w her re: plan of care, [facility]KC precautions. Will pin down delivery date around 35 weeks  3. Multigravida of advanced maternal age in third trimester No  issues  Preterm labor symptoms and general obstetric precautions including but not limited to vaginal bleeding, contractions, leaking of fluid and fetal movement were reviewed in detail with the patient. Please refer to After Visit Summary for other counseling recommendations.   Return in about 2 weeks (around 04/17/2022) for in person or virtual, md visit, high risk ob.  Future Appointments  Date Time Provider [place]  04/07/2022  3:30 PM [facility] NURSE [facility]  04/07/2022  3:45 PM [facility] US6 [facility]  04/14/2022  3:15 PM [facility] NURSE [facility]  04/14/2022  3:30 PM [facility] US3 [facility]  04/18/2022  3:55 PM Minette Brine [facility]    Aletha Halim, MD

## 2022-04-07 ENCOUNTER — Ambulatory Visit: Payer: Medicaid Other | Admitting: *Deleted

## 2022-04-07 ENCOUNTER — Ambulatory Visit: Payer: Medicaid Other

## 2022-04-07 ENCOUNTER — Ambulatory Visit: Payer: Medicaid Other | Attending: Maternal & Fetal Medicine

## 2022-04-07 ENCOUNTER — Other Ambulatory Visit: Payer: Medicaid Other

## 2022-04-07 VITALS — BP 121/62 | HR 101

## 2022-04-07 DIAGNOSIS — O0990 Supervision of high risk pregnancy, unspecified, unspecified trimester: Secondary | ICD-10-CM | POA: Insufficient documentation

## 2022-04-07 DIAGNOSIS — O09523 Supervision of elderly multigravida, third trimester: Secondary | ICD-10-CM | POA: Diagnosis present

## 2022-04-14 ENCOUNTER — Ambulatory Visit: Payer: Medicaid Other

## 2022-04-18 ENCOUNTER — Other Ambulatory Visit: Payer: Self-pay

## 2022-04-18 ENCOUNTER — Ambulatory Visit (INDEPENDENT_AMBULATORY_CARE_PROVIDER_SITE_OTHER): Payer: Medicaid Other | Admitting: Advanced Practice Midwife

## 2022-04-18 VITALS — BP 120/61 | HR 105 | Wt 197.0 lb

## 2022-04-18 DIAGNOSIS — R7303 Prediabetes: Secondary | ICD-10-CM

## 2022-04-18 DIAGNOSIS — O0993 Supervision of high risk pregnancy, unspecified, third trimester: Secondary | ICD-10-CM

## 2022-04-18 DIAGNOSIS — O219 Vomiting of pregnancy, unspecified: Secondary | ICD-10-CM

## 2022-04-18 DIAGNOSIS — Z3A33 33 weeks gestation of pregnancy: Secondary | ICD-10-CM

## 2022-04-18 DIAGNOSIS — R000 Tachycardia, unspecified: Secondary | ICD-10-CM

## 2022-04-18 DIAGNOSIS — D573 Sickle-cell trait: Secondary | ICD-10-CM

## 2022-04-18 DIAGNOSIS — O09523 Supervision of elderly multigravida, third trimester: Secondary | ICD-10-CM

## 2022-04-18 DIAGNOSIS — O365990 Maternal care for other known or suspected poor fetal growth, unspecified trimester, not applicable or unspecified: Secondary | ICD-10-CM

## 2022-04-18 DIAGNOSIS — O0990 Supervision of high risk pregnancy, unspecified, unspecified trimester: Secondary | ICD-10-CM

## 2022-04-18 MED ORDER — PROMETHAZINE HCL 25 MG PO TABS: 25.0000 mg | ORAL_TABLET | Freq: Four times a day (QID) | ORAL | 2 refills | Status: DC | PRN

## 2022-04-18 MED ORDER — ACCU-CHEK GUIDE VI STRP: ORAL_STRIP | 12 refills | Status: DC

## 2022-04-18 NOTE — Progress Notes (Signed)
   PRENATAL VISIT NOTE  Subjective:  Stacey [name] is a 36 y.o. [phone] at [redacted]w[redacted]d being seen today for ongoing prenatal care.  She is currently monitored for the following issues for this high-risk pregnancy and has Sickle cell trait (HCC); Alpha thalassemia trait; Hidradenitis; Supervision of high risk pregnancy, antepartum; AMA (advanced maternal age) multigravida 35+; Prediabetes; and Fetal growth restriction antepartum on their problem list.  Patient reports  rectal pressure . Declines exam.  Contractions: Irregular. Vag. Bleeding: None.  Movement: Present. Denies leaking of fluid.   The following portions of the patient's history were reviewed and updated as appropriate: allergies, current medications, past family history, past medical history, past social history, past surgical history and problem list.   Objective:   Vitals:   04/18/22 1608  BP: 120/61  Pulse: (!) 121  Weight: 197 lb (89.4 kg)  HR 105  Fetal Status: Fetal Heart Rate (bpm): 140   Movement: Present     General:  Alert, oriented and cooperative. Patient is in no acute distress.  Skin: Skin is warm and dry. No rash noted.   Cardiovascular: Normal heart rate noted  Respiratory: Normal respiratory effort, no problems with respiration noted  Abdomen: Soft, gravid, appropriate for gestational age.  Pain/Pressure: Present     Pelvic: Declined         Extremities: Normal range of motion.     Mental Status: Normal mood and affect. Normal behavior. Normal judgment and thought content.   Assessment and Plan:  Pregnancy: [id number] at [redacted]w[redacted]d 1. Multigravida of advanced maternal age in third trimester - Antenatal testing per MFM  2. Fetal growth restriction antepartum - [place] 12/4  3. Sickle cell trait (HCC) - New partner test given  4. Supervision of high risk pregnancy, antepartum - F?U 2 weeks  5. [redacted] weeks gestation of pregnancy   Preterm labor symptoms and general obstetric precautions including but not limited to  vaginal bleeding, contractions, leaking of fluid and fetal movement were reviewed in detail with the patient. Please refer to After Visit Summary for other counseling recommendations.   No follow-ups on file.  Future Appointments  Date Time Provider Department Center  05/01/2022  8:30 AM [facility] NURSE [facility]  05/01/2022  8:45 AM [facility] US6 [facility]    Dorathy Kinsman, CNM

## 2022-05-01 ENCOUNTER — Ambulatory Visit: Payer: Medicaid Other | Attending: Maternal & Fetal Medicine | Admitting: *Deleted

## 2022-05-01 ENCOUNTER — Ambulatory Visit (HOSPITAL_BASED_OUTPATIENT_CLINIC_OR_DEPARTMENT_OTHER): Payer: Medicaid Other

## 2022-05-01 VITALS — BP 126/71 | HR 100

## 2022-05-01 DIAGNOSIS — O09293 Supervision of pregnancy with other poor reproductive or obstetric history, third trimester: Secondary | ICD-10-CM

## 2022-05-01 DIAGNOSIS — O24113 Pre-existing diabetes mellitus, type 2, in pregnancy, third trimester: Secondary | ICD-10-CM | POA: Insufficient documentation

## 2022-05-01 DIAGNOSIS — O99013 Anemia complicating pregnancy, third trimester: Secondary | ICD-10-CM | POA: Insufficient documentation

## 2022-05-01 DIAGNOSIS — Z3A35 35 weeks gestation of pregnancy: Secondary | ICD-10-CM | POA: Insufficient documentation

## 2022-05-01 DIAGNOSIS — O99113 Other diseases of the blood and blood-forming organs and certain disorders involving the immune mechanism complicating pregnancy, third trimester: Secondary | ICD-10-CM

## 2022-05-01 DIAGNOSIS — O09513 Supervision of elderly primigravida, third trimester: Secondary | ICD-10-CM | POA: Diagnosis not present

## 2022-05-01 DIAGNOSIS — O99891 Other specified diseases and conditions complicating pregnancy: Secondary | ICD-10-CM | POA: Diagnosis not present

## 2022-05-01 DIAGNOSIS — O365930 Maternal care for other known or suspected poor fetal growth, third trimester, not applicable or unspecified: Secondary | ICD-10-CM | POA: Diagnosis not present

## 2022-05-01 DIAGNOSIS — Z148 Genetic carrier of other disease: Secondary | ICD-10-CM

## 2022-05-01 DIAGNOSIS — O99513 Diseases of the respiratory system complicating pregnancy, third trimester: Secondary | ICD-10-CM

## 2022-05-01 DIAGNOSIS — O09522 Supervision of elderly multigravida, second trimester: Secondary | ICD-10-CM

## 2022-05-01 DIAGNOSIS — O0990 Supervision of high risk pregnancy, unspecified, unspecified trimester: Secondary | ICD-10-CM

## 2022-05-01 DIAGNOSIS — D573 Sickle-cell trait: Secondary | ICD-10-CM

## 2022-05-05 ENCOUNTER — Encounter: Payer: Medicaid Other | Admitting: Obstetrics and Gynecology

## 2022-05-08 ENCOUNTER — Encounter: Payer: Self-pay | Admitting: Obstetrics and Gynecology

## 2022-05-11 ENCOUNTER — Ambulatory Visit (INDEPENDENT_AMBULATORY_CARE_PROVIDER_SITE_OTHER): Payer: Medicaid Other | Admitting: Obstetrics and Gynecology

## 2022-05-11 ENCOUNTER — Encounter: Payer: Self-pay | Admitting: *Deleted

## 2022-05-11 ENCOUNTER — Other Ambulatory Visit: Payer: Self-pay

## 2022-05-11 VITALS — BP 120/84 | HR 106 | Wt 199.3 lb

## 2022-05-11 DIAGNOSIS — Z3A37 37 weeks gestation of pregnancy: Secondary | ICD-10-CM

## 2022-05-11 DIAGNOSIS — Z0289 Encounter for other administrative examinations: Secondary | ICD-10-CM

## 2022-05-11 DIAGNOSIS — O365990 Maternal care for other known or suspected poor fetal growth, unspecified trimester, not applicable or unspecified: Secondary | ICD-10-CM

## 2022-05-11 DIAGNOSIS — O365930 Maternal care for other known or suspected poor fetal growth, third trimester, not applicable or unspecified: Secondary | ICD-10-CM

## 2022-05-11 DIAGNOSIS — D563 Thalassemia minor: Secondary | ICD-10-CM

## 2022-05-11 DIAGNOSIS — O0990 Supervision of high risk pregnancy, unspecified, unspecified trimester: Secondary | ICD-10-CM

## 2022-05-11 DIAGNOSIS — O09523 Supervision of elderly multigravida, third trimester: Secondary | ICD-10-CM

## 2022-05-11 DIAGNOSIS — O0993 Supervision of high risk pregnancy, unspecified, third trimester: Secondary | ICD-10-CM

## 2022-05-11 NOTE — Progress Notes (Signed)
Pt re-signed BTL form today. Pt did not bring Glucose log today.

## 2022-05-11 NOTE — Progress Notes (Signed)
   PRENATAL VISIT NOTE  Subjective:  Stacey [name] is a 36 y.o. [phone] at [redacted]w[redacted]d being seen today for ongoing prenatal care.  She is currently monitored for the following issues for this low-risk pregnancy and has Sickle cell trait (HCC); Alpha thalassemia trait; Hidradenitis; Supervision of high risk pregnancy, antepartum; AMA (advanced maternal age) multigravida 35+; Prediabetes; and Fetal growth restriction antepartum on their problem list.  Patient doing well with no acute concerns today. She reports  mild lower extremity edema .  Contractions: Irregular. Vag. Bleeding: None.  Movement: Present. Denies leaking of fluid.   The following portions of the patient's history were reviewed and updated as appropriate: allergies, current medications, past family history, past medical history, past social history, past surgical history and problem list. Problem list updated.  Objective:   Vitals:   05/11/22 1139  BP: 120/84  Pulse: (!) 106  Weight: 199 lb 4.8 oz (90.4 kg)    Fetal Status: Fetal Heart Rate (bpm): 139 Fundal Height: 38 cm Movement: Present     General:  Alert, oriented and cooperative. Patient is in no acute distress.  Skin: Skin is warm and dry. No rash noted.   Cardiovascular: Normal heart rate noted  Respiratory: Normal respiratory effort, no problems with respiration noted  Abdomen: Soft, gravid, appropriate for gestational age.  Pain/Pressure: Present     Pelvic: Cervical exam deferred        Extremities: Normal range of motion.  Edema: Trace  Mental Status:  Normal mood and affect. Normal behavior. Normal judgment and thought content.   Assessment and Plan:  Pregnancy: [id number] at [redacted]w[redacted]d  1. [redacted] weeks gestation of pregnancy   2. Supervision of high risk pregnancy, antepartum Continue routine prenatal care  3. Fetal growth restriction antepartum FGR has resolved, EFW now at 33%  4. Multigravida of advanced maternal age in third trimester   5. Alpha thalassemia  trait   Term labor symptoms and general obstetric precautions including but not limited to vaginal bleeding, contractions, leaking of fluid and fetal movement were reviewed in detail with the patient.  Please refer to After Visit Summary for other counseling recommendations.   Return in about 1 week (around 05/18/2022) for ROB, in person.   Mariel Aloe, MD Faculty Attending Center for [facility]

## 2022-05-18 ENCOUNTER — Encounter: Payer: Self-pay | Admitting: Obstetrics and Gynecology

## 2022-05-18 ENCOUNTER — Encounter: Payer: Self-pay | Admitting: Advanced Practice Midwife

## 2022-05-21 ENCOUNTER — Inpatient Hospital Stay (HOSPITAL_COMMUNITY)
Admission: AD | Admit: 2022-05-21 | Discharge: 2022-05-22 | Disposition: A | Discharge status: Home / Self Care | Payer: Medicaid Other | Attending: Obstetrics and Gynecology | Admitting: Obstetrics and Gynecology

## 2022-05-21 ENCOUNTER — Encounter (HOSPITAL_COMMUNITY): Payer: Self-pay | Admitting: Obstetrics and Gynecology

## 2022-05-21 DIAGNOSIS — O471 False labor at or after 37 completed weeks of gestation: Secondary | ICD-10-CM | POA: Insufficient documentation

## 2022-05-21 DIAGNOSIS — Z3A38 38 weeks gestation of pregnancy: Secondary | ICD-10-CM | POA: Diagnosis not present

## 2022-05-21 DIAGNOSIS — O479 False labor, unspecified: Secondary | ICD-10-CM

## 2022-05-21 NOTE — MAU Note (Incomplete)
.  Stacey [name] is a 36 y.o. at [redacted]w[redacted]d here in MAU reporting: reports contractions every 10 min. That began at 60. Denies SROM, vaginal bleeding or bloody show. Endorses + fetal movement. Denies complications with pregnancy. Pt states from 30-33weeks baby's growth declined. GBS unknown  Onset of complaint: 1855 Pain score: 6 lower abdomen Vitals:   05/21/22 2221 05/21/22 2240  BP: 125/79 126/87  Pulse: (!) 117 (!) 138  Resp: 17   Temp: 98 F (36.7 C)   SpO2: 99% 100%     FHT:145bpm Lab orders placed from triage:  mau labor

## 2022-05-22 DIAGNOSIS — O479 False labor, unspecified: Secondary | ICD-10-CM

## 2022-05-22 DIAGNOSIS — Z3A38 38 weeks gestation of pregnancy: Secondary | ICD-10-CM | POA: Diagnosis not present

## 2022-05-22 DIAGNOSIS — O471 False labor at or after 37 completed weeks of gestation: Secondary | ICD-10-CM | POA: Diagnosis not present

## 2022-05-22 NOTE — MAU Provider Note (Signed)
[name] is a [id number] at [redacted]w[redacted]d seen in MAU for labor. RN labor check, not seen by provider. SVE by RN Dilation: 1 Effacement (%): Thick Cervical Position: Middle Exam by:: Ginnie Smart, RN   NST - FHR: 135 bpm / moderate variability / accels present / decels absent / TOCO: regular every 3-7 mins   Plan: Patient is contractions picked up at the end.  However the patient continued monitoring and recheck with patient like to go home. D/C home with labor precautions Keep scheduled appt with Dr. Ladon Applebaum on 05/25/22  Celedonio Savage, MD  05/22/2022 12:36 AM

## 2022-05-24 ENCOUNTER — Encounter (HOSPITAL_COMMUNITY): Payer: Self-pay | Admitting: Obstetrics and Gynecology

## 2022-05-24 ENCOUNTER — Inpatient Hospital Stay (HOSPITAL_COMMUNITY): Payer: Medicaid Other | Admitting: Anesthesiology

## 2022-05-24 ENCOUNTER — Other Ambulatory Visit: Payer: Self-pay

## 2022-05-24 ENCOUNTER — Inpatient Hospital Stay (HOSPITAL_COMMUNITY)
Admission: AD | Admit: 2022-05-24 | Discharge: 2022-05-26 | DRG: 807 | Disposition: A | Discharge status: Home / Self Care | Payer: Medicaid Other | Attending: Obstetrics and Gynecology | Admitting: Obstetrics and Gynecology

## 2022-05-24 DIAGNOSIS — O9902 Anemia complicating childbirth: Secondary | ICD-10-CM | POA: Diagnosis present

## 2022-05-24 DIAGNOSIS — O99824 Streptococcus B carrier state complicating childbirth: Secondary | ICD-10-CM | POA: Diagnosis present

## 2022-05-24 DIAGNOSIS — O0990 Supervision of high risk pregnancy, unspecified, unspecified trimester: Principal | ICD-10-CM

## 2022-05-24 DIAGNOSIS — O09529 Supervision of elderly multigravida, unspecified trimester: Secondary | ICD-10-CM

## 2022-05-24 DIAGNOSIS — Z8632 Personal history of gestational diabetes: Secondary | ICD-10-CM | POA: Diagnosis not present

## 2022-05-24 DIAGNOSIS — R7303 Prediabetes: Secondary | ICD-10-CM | POA: Diagnosis present

## 2022-05-24 DIAGNOSIS — D573 Sickle-cell trait: Secondary | ICD-10-CM | POA: Diagnosis present

## 2022-05-24 DIAGNOSIS — Z148 Genetic carrier of other disease: Secondary | ICD-10-CM

## 2022-05-24 DIAGNOSIS — D563 Thalassemia minor: Secondary | ICD-10-CM | POA: Diagnosis present

## 2022-05-24 DIAGNOSIS — O365990 Maternal care for other known or suspected poor fetal growth, unspecified trimester, not applicable or unspecified: Secondary | ICD-10-CM | POA: Diagnosis present

## 2022-05-24 DIAGNOSIS — O365930 Maternal care for other known or suspected poor fetal growth, third trimester, not applicable or unspecified: Principal | ICD-10-CM | POA: Diagnosis present

## 2022-05-24 DIAGNOSIS — Z3A39 39 weeks gestation of pregnancy: Secondary | ICD-10-CM

## 2022-05-24 DIAGNOSIS — O4292 Full-term premature rupture of membranes, unspecified as to length of time between rupture and onset of labor: Secondary | ICD-10-CM

## 2022-05-24 DIAGNOSIS — O26893 Other specified pregnancy related conditions, third trimester: Secondary | ICD-10-CM | POA: Diagnosis present

## 2022-05-24 LAB — CBC
HCT: 38 % (ref 36.0–46.0)
Hemoglobin: 12.9 g/dL (ref 12.0–15.0)
MCH: 26.2 pg (ref 26.0–34.0)
MCHC: 33.9 g/dL (ref 30.0–36.0)
MCV: 77.2 fL — ABNORMAL LOW (ref 80.0–100.0)
Platelets: 232 10*3/uL (ref 150–400)
RBC: 4.92 MIL/uL (ref 3.87–5.11)
RDW: 16.1 % — ABNORMAL HIGH (ref 11.5–15.5)
WBC: 6.8 10*3/uL (ref 4.0–10.5)
nRBC: 0 % (ref 0.0–0.2)

## 2022-05-24 LAB — POCT FERN TEST: POCT Fern Test: POSITIVE

## 2022-05-24 LAB — TYPE AND SCREEN
ABO/RH(D): B POS
Antibody Screen: NEGATIVE

## 2022-05-24 LAB — GROUP B STREP BY PCR: Group B strep by PCR: POSITIVE — AB

## 2022-05-24 LAB — RPR: RPR Ser Ql: NONREACTIVE

## 2022-05-24 MED ORDER — SENNOSIDES-DOCUSATE SODIUM 8.6-50 MG PO TABS
2.0000 | ORAL_TABLET | Freq: Every day | ORAL | Status: DC
  Administered 2022-05-25 – 2022-05-26 (×2): 2 via ORAL
  Filled 2022-05-24 (×2): qty 2

## 2022-05-24 MED ORDER — OXYTOCIN-SODIUM CHLORIDE 30-0.9 UT/500ML-% IV SOLN
2.5000 [IU]/h | INTRAVENOUS | Status: DC
  Filled 2022-05-24: qty 500

## 2022-05-24 MED ORDER — EPHEDRINE 5 MG/ML INJ: 10.0000 mg | INTRAVENOUS | Status: DC | PRN

## 2022-05-24 MED ORDER — IBUPROFEN 600 MG PO TABS
600.0000 mg | ORAL_TABLET | Freq: Four times a day (QID) | ORAL | Status: DC
  Administered 2022-05-24 – 2022-05-26 (×6): 600 mg via ORAL
  Filled 2022-05-24 (×9): qty 1

## 2022-05-24 MED ORDER — ONDANSETRON HCL 4 MG PO TABS: 4.0000 mg | ORAL_TABLET | ORAL | Status: DC | PRN

## 2022-05-24 MED ORDER — SODIUM CHLORIDE 0.9 % IV SOLN: 5.0000 10*6.[IU] | Freq: Once | INTRAVENOUS | Status: DC

## 2022-05-24 MED ORDER — DIPHENHYDRAMINE HCL 25 MG PO CAPS: 25.0000 mg | ORAL_CAPSULE | Freq: Four times a day (QID) | ORAL | Status: DC | PRN

## 2022-05-24 MED ORDER — DIBUCAINE (PERIANAL) 1 % EX OINT: 1.0000 | TOPICAL_OINTMENT | CUTANEOUS | Status: DC | PRN

## 2022-05-24 MED ORDER — FENTANYL-BUPIVACAINE-NACL 0.5-0.125-0.9 MG/250ML-% EP SOLN
12.0000 mL/h | EPIDURAL | Status: DC | PRN
  Administered 2022-05-24: 12 mL/h via EPIDURAL
  Filled 2022-05-24: qty 250

## 2022-05-24 MED ORDER — LACTATED RINGERS IV SOLN: 500.0000 mL | INTRAVENOUS | Status: DC | PRN

## 2022-05-24 MED ORDER — LIDOCAINE HCL (PF) 1 % IJ SOLN: 30.0000 mL | INTRAMUSCULAR | Status: DC | PRN

## 2022-05-24 MED ORDER — SIMETHICONE 80 MG PO CHEW: 80.0000 mg | CHEWABLE_TABLET | ORAL | Status: DC | PRN

## 2022-05-24 MED ORDER — BENZOCAINE-MENTHOL 20-0.5 % EX AERO
1.0000 | INHALATION_SPRAY | CUTANEOUS | Status: DC | PRN
  Administered 2022-05-24: 1 via TOPICAL
  Filled 2022-05-24: qty 56

## 2022-05-24 MED ORDER — WITCH HAZEL-GLYCERIN EX PADS: 1.0000 | MEDICATED_PAD | CUTANEOUS | Status: DC | PRN

## 2022-05-24 MED ORDER — COCONUT OIL OIL: 1.0000 | TOPICAL_OIL | Status: DC | PRN

## 2022-05-24 MED ORDER — FENTANYL CITRATE (PF) 100 MCG/2ML IJ SOLN
50.0000 ug | INTRAMUSCULAR | Status: DC | PRN
  Administered 2022-05-24: 50 ug via INTRAVENOUS
  Filled 2022-05-24: qty 2

## 2022-05-24 MED ORDER — LACTATED RINGERS IV SOLN: 500.0000 mL | Freq: Once | INTRAVENOUS | Status: DC

## 2022-05-24 MED ORDER — LACTATED RINGERS IV SOLN: INTRAVENOUS | Status: DC

## 2022-05-24 MED ORDER — ONDANSETRON HCL 4 MG/2ML IJ SOLN: 4.0000 mg | INTRAMUSCULAR | Status: DC | PRN

## 2022-05-24 MED ORDER — TETANUS-DIPHTH-ACELL PERTUSSIS 5-2.5-18.5 LF-MCG/0.5 IM SUSY: 0.5000 mL | PREFILLED_SYRINGE | Freq: Once | INTRAMUSCULAR | Status: DC

## 2022-05-24 MED ORDER — DIPHENHYDRAMINE HCL 50 MG/ML IJ SOLN: 12.5000 mg | INTRAMUSCULAR | Status: DC | PRN

## 2022-05-24 MED ORDER — PENICILLIN G POT IN DEXTROSE 60000 UNIT/ML IV SOLN: 3.0000 10*6.[IU] | INTRAVENOUS | Status: DC

## 2022-05-24 MED ORDER — PRENATAL MULTIVITAMIN CH
1.0000 | ORAL_TABLET | Freq: Every day | ORAL | Status: DC
  Administered 2022-05-24 – 2022-05-26 (×3): 1 via ORAL
  Filled 2022-05-24 (×3): qty 1

## 2022-05-24 MED ORDER — PHENYLEPHRINE 80 MCG/ML (10ML) SYRINGE FOR IV PUSH (FOR BLOOD PRESSURE SUPPORT): 80.0000 ug | PREFILLED_SYRINGE | INTRAVENOUS | Status: DC | PRN

## 2022-05-24 MED ORDER — LIDOCAINE HCL (PF) 1 % IJ SOLN
INTRAMUSCULAR | Status: DC | PRN
  Administered 2022-05-24: 11 mL via EPIDURAL

## 2022-05-24 MED ORDER — SOD CITRATE-CITRIC ACID 500-334 MG/5ML PO SOLN: 30.0000 mL | ORAL | Status: DC | PRN

## 2022-05-24 MED ORDER — ACETAMINOPHEN 325 MG PO TABS: 650.0000 mg | ORAL_TABLET | ORAL | Status: DC | PRN

## 2022-05-24 MED ORDER — OXYTOCIN BOLUS FROM INFUSION: 333.0000 mL | Freq: Once | INTRAVENOUS | Status: DC

## 2022-05-24 MED ORDER — ZOLPIDEM TARTRATE 5 MG PO TABS: 5.0000 mg | ORAL_TABLET | Freq: Every evening | ORAL | Status: DC | PRN

## 2022-05-24 MED ORDER — ONDANSETRON HCL 4 MG/2ML IJ SOLN: 4.0000 mg | Freq: Four times a day (QID) | INTRAMUSCULAR | Status: DC | PRN

## 2022-05-24 NOTE — H&P (Signed)
OBSTETRIC ADMISSION HISTORY AND PHYSICAL  Stacey [name] is a 36 y.o. female [phone] with IUP at [redacted]w[redacted]d by LMP presenting for SROM. She reports +FMs, No LOF, no VB, no blurry vision, headaches or peripheral edema, and RUQ pain.  She plans on breast and bottle feeding. She request BTL for birth control. She received her prenatal care at  [facility]    Dating: By LMP --->  Estimated Date of Delivery: 05/31/22  Sono:    @[redacted]w[redacted]d , CWD, normal anatomy, cephalic presentation, anterior placental lie, 2594g, 33% EFW   Prenatal History/Complications:  -AMA -SCT -alpha thal trait -prediabetes -Hx of FGR, resolved  Past Medical History: Past Medical History:  Diagnosis Date   Eczema 04/16/2019   Gestational diabetes    History of gestational diabetes 12/12/2021   Increased heart rate 09/05/2019   Increased heart rate   Left carotid bruit 09/05/2019   Left carotid bruit   Left carotid bruit 09/05/2019   Neg imaging 08/2019  Left carotid bruit   Normal labor 07/31/2019   Supervision of high risk pregnancy, antepartum 01/20/2019    Nursing Staff Provider  Office Location  [place]  Dating  LMP  Language  English  Anatomy 01/22/2019  Inadequate at [redacted]w[redacted]d, repeat ordered  Flu Vaccine  02/07/19 Genetic Screen  NIPS:  Normal female  AFP:  Neg  First Screen:  Quad:    TDaP vaccine  05/13/19  Hgb A1C or  GTT Early  Third trimester 95 138 104  Rhogam  n/a   LAB RESULTS   Feeding Plan Breast  Blood Type B/Positive/-- (09/11 1133)   Contraception P    Past Surgical History: Past Surgical History:  Procedure Laterality Date   NO PAST SURGERIES      Obstetrical History: OB History     Gravida  4   Para  2   Term  2   Preterm  0   AB  1   Living  2      SAB  1   IAB  0   Ectopic  0   Multiple  0   Live Births  2           Social History Social History   Socioeconomic History   Marital status: Single    Spouse name: Not on file   Number of children: Not on file   Years of education: Not on  file   Highest education level: Not on file  Occupational History   Not on file  Tobacco Use   Smoking status: Never   Smokeless tobacco: Never  Vaping Use   Vaping Use: Never used  Substance and Sexual Activity   Alcohol use: No   Drug use: No   Sexual activity: Yes    Partners: Male    Birth control/protection: None  Other Topics Concern   Not on file  Social History Narrative   Not on file   Social Determinants of Health   Financial Resource Strain: Not on file  Food Insecurity: No Food Insecurity (12/12/2021)   Hunger Vital Sign    Worried About Running Out of Food in the Last Year: Never true    Ran Out of Food in the Last Year: Never true  Transportation Needs: No Transportation Needs (12/12/2021)   PRAPARE - 12/14/2021 (Medical): No    Lack of Transportation (Non-Medical): No  Physical Activity: Not on file  Stress: Not on file  Social Connections: Not on file  Family History: Family History  Problem Relation Age of Onset   Hypertension Mother    Asthma Neg Hx    Birth defects Neg Hx    Cancer Neg Hx    Diabetes Neg Hx    Heart disease Neg Hx    Stroke Neg Hx     Allergies: No Known Allergies  Medications Prior to Admission  Medication Sig Dispense Refill Last Dose   ondansetron (ZOFRAN-ODT) 8 MG disintegrating tablet Take 1 tablet (8 mg total) by mouth every 8 (eight) hours as needed for nausea or vomiting. 60 tablet 3 05/23/2022   prenatal vitamin w/FE, FA (PRENATAL 1 + 1) 27-1 MG TABS tablet Take 1 tablet by mouth daily at 12 noon.   05/23/2022   promethazine (PHENERGAN) 25 MG tablet Take 1 tablet (25 mg total) by mouth every 6 (six) hours as needed for nausea or vomiting. 30 tablet 2      Review of Systems   All systems reviewed and negative except as stated in HPI  Blood pressure 125/74, pulse (!) 102, temperature 97.8 F (36.6 C), resp. rate 18, height 5\' 7"  (1.702 m), weight 88.5 kg, last menstrual period  08/24/2021, SpO2 100 %, unknown if currently breastfeeding. General appearance: alert and no distress Lungs: normal effort Heart: mildly tachycardic Abdomen: gravid Pelvic: 6.5/90/-1 Extremities: No LE, no sign of DVT Presentation: cephalic Fetal monitoringBaseline: 120 bpm, Variability: Good {> 6 bpm), Accelerations: Reactive, and Decelerations: Variable: moderate Uterine activity difficult to trace     Prenatal labs: ABO, Rh: B/Positive/-- (07/11 1636) Antibody: Negative (07/11 1636) Rubella: 20.00 (07/11 1636) RPR: Non Reactive (09/19 0837)  HBsAg: Negative (07/11 1636)  HIV: Non Reactive (09/19 0837)  GBS:   Unknown, Cx from 12/4 pending and preincubated for better growth 1 hr Glucola wnl Genetic screening  LR NIPS, AFP neg, SCT and alpha thal silent carrier Anatomy US wnl, girl  Prenatal Transfer Tool  Maternal Diabetes: No Genetic Screening: Normal Maternal Ultrasounds/Referrals: Normal and IUGR, resolved Fetal Ultrasounds or other Referrals:  None Maternal Substance Abuse:  No Significant Maternal Medications:  None Significant Maternal Lab Results:  Other:  GBS unknown Number of Prenatal Visits:greater than 3 verified prenatal visits Other Comments:  None  No results found for this or any previous visit (from the past 24 hour(s)).  Patient Active Problem List   Diagnosis Date Noted   Fetal growth restriction antepartum 03/22/2022   Prediabetes 12/07/2021   Supervision of high risk pregnancy, antepartum 12/06/2021   AMA (advanced maternal age) multigravida 35+ 12/06/2021   Hidradenitis 08/01/2017   Sickle cell trait ([place]) 02/14/2017   Alpha thalassemia trait 02/14/2017    Assessment/Plan:  Stacey [name] is a 36 y.o. G4P2012 at [redacted]w[redacted]d here for SROM with light meconium stained fluid.   #Labor: Progressing was call 2cm in the MAU 2 hours prior. Continue expectant management #Pain: Epidural #FWB: Cat II, IVF bolus FSE placed and will consider  amnioinfusion #ID: [id number] PENDING preincubated for better growth, Hx of PCN ppx #MOF: Both #MOC: BTL papers signed 05/11/2022 #Circ:  N/a, girl  Stacey Oley Autry-Lott, DO  05/24/2022, 5:21 AM

## 2022-05-24 NOTE — Lactation Note (Signed)
This note was copied from a baby's chart. Lactation Consultation Note  Patient Name: Stacey [name] [id number] Date: 05/24/2022 Reason for consult: L&D Initial assessment;Maternal endocrine disorder Age:36 hours  P3, Assisted with latching.  Lactation to follow up on MBU.  Maternal Data Does the patient have breastfeeding experience prior to this delivery?: Yes How long did the patient breastfeed?: 6 mos exclusive and then 6 mos breast/formula  Feeding Mother's Current Feeding Choice: Breast Milk  LATCH Score Latch: Grasps breast easily, tongue down, lips flanged, rhythmical sucking.  Audible Swallowing: Spontaneous and intermittent  Type of Nipple: Everted at rest and after stimulation  Comfort (Breast/Nipple): Soft / non-tender  Hold (Positioning): Assistance needed to correctly position infant at breast and maintain latch. (from lactation)  LATCH Score: 9  Interventions Interventions: Assisted with latch;Skin to skin;Education Consult Status Consult Status: Follow-up from L&D   Dahlia Byes [facility] 05/24/2022, 8:57 AM

## 2022-05-24 NOTE — Lactation Note (Signed)
This note was copied from a baby's chart. Lactation Consultation Note  Patient Name: Girl Denny Lave [id number] Date: 05/24/2022 Reason for consult: Initial assessment;Term Age:36 hours Infant had one void diaper since birth, LC did not observe latch at this time, Per Birth Parent infant recently BF for 20 minutes at 1600 pm and was asleep in basinet when LC was in the room. Birth Parent is experienced with breast feeding see maternal data below. Birth Parent plans to continue to BF infant on demand, by cues, 8 -12 times within 24 hours, STS. Birth Parent requested hand pump for prn, she plans to order DEBP with her insurance. Birth Parent knows to call RN/LC if their are any BF questions, concerns or if latch assistance is needed.  Maternal Data Has patient been taught Hand Expression?: Yes Does the patient have breastfeeding experience prior to this delivery?: Yes How long did the patient breastfeed?: Birth Parent exclusively BF 1st two children for 6 months each and afterwards supplemented with formula and BF until 1 year each. Her 2nd child is currently 23 years old.  Feeding Mother's Current Feeding Choice: Breast Milk and Formula  LATCH Score                    Lactation Tools Discussed/Used Tools: Pump;Flanges Flange Size: 24 Breast pump type: Manual Pump Education: Setup, frequency, and cleaning;Milk Storage Reason for Pumping: Birth Parent requested Pumping frequency: PRN home use  Interventions Interventions: Breast feeding basics reviewed;Skin to skin;Expressed milk;Hand express;Education;LC Services brochure  Discharge    Consult Status Consult Status: Follow-up Date: 05/25/22 Follow-up type: In-patient    Frederico Hamman 05/24/2022, 6:19 PM

## 2022-05-24 NOTE — Anesthesia Procedure Notes (Signed)
Epidural Patient location during procedure: OB Start time: 05/24/2022 7:07 AM End time: 05/24/2022 7:22 AM  Staffing Anesthesiologist: Lowella Curb, MD Performed: anesthesiologist   Preanesthetic Checklist Completed: patient identified, IV checked, site marked, risks and benefits discussed, surgical consent, monitors and equipment checked, pre-op evaluation and timeout performed  Epidural Patient position: sitting Prep: ChloraPrep Patient monitoring: heart rate, cardiac monitor, continuous pulse ox and blood pressure Approach: midline Location: L2-L3 Injection technique: LOR saline  Needle:  Needle type: Tuohy  Needle gauge: 17 G Needle length: 9 cm Needle insertion depth: 6 cm Catheter type: closed end flexible Catheter size: 20 Guage Catheter at skin depth: 10 cm Test dose: negative  Assessment Events: blood not aspirated, injection not painful, no injection resistance, no paresthesia and negative IV test  Additional Notes Reason for block:procedure for pain

## 2022-05-24 NOTE — MAU Note (Signed)
Pt informed that the ultrasound is considered a limited OB ultrasound and is not intended to be a complete ultrasound exam.  Patient also informed that the ultrasound is not being completed with the intent of assessing for fetal or placental anomalies or any pelvic abnormalities.  Explained that the purpose of today's ultrasound is to assess for presentation.  Patient acknowledges the purpose of the exam and the limitations of the study.    Vertex presentation confirmed 

## 2022-05-24 NOTE — MAU Note (Signed)
.  Stacey [name] is a 36 y.o. at [redacted]w[redacted]d here in MAU reporting contractions and leaking fld since 0400. Good FM. States fld was green in color. Was here 2days ago and 1cm at that time.   Onset of complaint: 0400 Pain score: 7 Vitals:   05/24/22 0455 05/24/22 0457  BP:  125/74  Pulse: (!) 102   Resp: 18   Temp: 97.8 F (36.6 C)   SpO2: 100%      FHT:127 Lab orders placed from triage:  mau labor eval

## 2022-05-24 NOTE — Anesthesia Preprocedure Evaluation (Signed)
Anesthesia Evaluation  Patient identified by MRN, date of birth, ID band Patient awake    Reviewed: Allergy & Precautions, H&P , NPO status , Patient's Chart, lab work & pertinent test results  Airway Mallampati: II  TM Distance: >3 FB Neck ROM: Full    Dental no notable dental hx.    Pulmonary neg pulmonary ROS   Pulmonary exam normal breath sounds clear to auscultation       Cardiovascular negative cardio ROS Normal cardiovascular exam Rhythm:Regular Rate:Normal     Neuro/Psych negative neurological ROS  negative psych ROS   GI/Hepatic negative GI ROS, Neg liver ROS,,,  Endo/Other  negative endocrine ROSdiabetes    Renal/GU negative Renal ROS  negative genitourinary   Musculoskeletal negative musculoskeletal ROS (+)    Abdominal   Peds negative pediatric ROS (+)  Hematology  (+) Blood dyscrasia, anemia   Anesthesia Other Findings   Reproductive/Obstetrics (+) Pregnancy                             Anesthesia Physical Anesthesia Plan  ASA: 3  Anesthesia Plan: Epidural   Post-op Pain Management:    Induction:   PONV Risk Score and Plan:   Airway Management Planned:   Additional Equipment:   Intra-op Plan:   Post-operative Plan:   Informed Consent:   Plan Discussed with:   Anesthesia Plan Comments:        Anesthesia Quick Evaluation

## 2022-05-24 NOTE — Discharge Summary (Addendum)
Postpartum Discharge Summary      Patient Name: Stacey [name] DOB: 01-Dec-1985 MRN: [id number]  Date of admission: 05/24/2022 Delivery date:05/24/2022  Delivering provider: Gerlene Fee  Date of discharge: 05/26/2022  Admitting diagnosis: Indication for care in labor and delivery, antepartum [O75.9] Intrauterine pregnancy: [redacted]w[redacted]d    Secondary diagnosis:  Principal Problem:   Indication for care in labor and delivery, antepartum Active Problems:   Sickle cell trait ([place]   Alpha thalassemia trait   AMA (advanced maternal age) multigravida 35+   Prediabetes   Fetal growth restriction antepartum  Additional problems: n/a    Discharge diagnosis: Term Pregnancy Delivered                                              Post partum procedures: n/a Augmentation: N/A Complications: None  Hospital course: Onset of Labor With Vaginal Delivery      36y.o. yo [id number] admitted in Latent Labor on 05/24/2022. Labor course was complicated by precipitous delivery.   Membrane Rupture Time/Date: 4:00 AM ,05/24/2022   Delivery Method:Vaginal, Spontaneous  Episiotomy: None  Lacerations:   N/A Patient had a routine postpartum course.  She is ambulating, tolerating a regular diet, passing flatus, and urinating well. Patient is discharged home in stable condition on 05/26/22.  Newborn Data: Birth date:05/24/2022  Birth time:7:51 AM  Gender:Female  Living status:Living  Apgars:8 ,9  Weight:3289 g   Magnesium Sulfate received: No BMZ received: No Rhophylac:No MMR:No T-DaP:Given prenatally Flu: No Transfusion:No  Physical exam  Vitals:   05/25/22 0510 05/25/22 1500 05/25/22 2119 05/26/22 0546  BP: 103/69 119/74 126/63 116/68  Pulse: (!) 102 100 (!) 102 95  Resp: _0 Temp: 98.3 F (36.8 C) 98.7 F (37.1 C) 98.3 F (36.8 C) 98.4 F (36.9 C)  TempSrc: Oral Oral Oral Oral  SpO2:   100% 99%  Weight:      Height:       General: alert, cooperative, and  no distress Lochia: appropriate Uterine Fundus: firm Incision: N/A DVT Evaluation: No evidence of DVT seen on physical exam. Labs: Lab Results  Component Value Date   WBC 6.8 05/24/2022   HGB 12.9 05/24/2022   HCT 38.0 05/24/2022   MCV 77.2 (L) 05/24/2022   PLT 232 05/24/2022      Latest Ref Rng & Units 10/07/2021    2:06 PM  CMP  Glucose 70 - 99 mg/dL 86   BUN 6 - 20 mg/dL 5   Creatinine 0.44 - 1.00 mg/dL 0.68   Sodium 135 - 145 mmol/L 134   Potassium 3.5 - 5.1 mmol/L 4.1   Chloride 98 - 111 mmol/L 107   CO2 22 - 32 mmol/L 24   Calcium 8.9 - 10.3 mg/dL 9.2   Total Protein 6.5 - 8.1 g/dL 7.0   Total Bilirubin 0.3 - 1.2 mg/dL 1.2   Alkaline Phos 38 - 126 U/L 55   AST 15 - 41 U/L 14   ALT 0 - 44 U/L 11    Edinburgh Score:    05/24/2022    9:47 AM  Edinburgh Postnatal Depression Scale Screening Tool  I have been able to laugh and see the funny side of things. 0  I have looked forward with enjoyment to things. 0  I have blamed myself unnecessarily when things went wrong.  0  I have been anxious or worried for no good reason. 0  I have felt scared or panicky for no good reason. 0  Things have been getting on top of me. 0  I have been so unhappy that I have had difficulty sleeping. 0  I have felt sad or miserable. 0  I have been so unhappy that I have been crying. 0  The thought of harming myself has occurred to me. 0  Edinburgh Postnatal Depression Scale Total 0     After visit meds:  Allergies as of 05/26/2022   No Known Allergies      Medication List     TAKE these medications    acetaminophen 325 MG tablet Commonly known as: Tylenol Take 2 tablets (650 mg total) by mouth every 4 (four) hours as needed (for pain scale < 4).   ibuprofen 600 MG tablet Commonly known as: ADVIL Take 1 tablet (600 mg total) by mouth every 6 (six) hours.   ondansetron 8 MG disintegrating tablet Commonly known as: ZOFRAN-ODT Take 1 tablet (8 mg total) by mouth every 8  (eight) hours as needed for nausea or vomiting.   prenatal vitamin w/FE, FA 27-1 MG Tabs tablet Take 1 tablet by mouth daily at 12 noon.   promethazine 25 MG tablet Commonly known as: PHENERGAN Take 1 tablet (25 mg total) by mouth every 6 (six) hours as needed for nausea or vomiting.         Discharge home in stable condition Infant Feeding: Bottle and Breast Infant Disposition:home with mother Discharge instruction: per After Visit Summary and Postpartum booklet. Activity: Advance as tolerated. Pelvic rest for 6 weeks.  Diet: routine diet Future Appointments: No future appointments.  Follow up Visit:  Message sent to [facility] by Autry-Lott on 05/26/2022  Please schedule this patient for a Virtual postpartum visit in 6 weeks with the following provider: Any provider. Additional Postpartum F/U: 2 week follow up for post op visit, desires interval tubal High risk pregnancy complicated by:  AMA, prediabetes nml gtt Delivery mode:  Vaginal, Spontaneous  Anticipated Birth Control:  Plans Interval BTL   05/26/2022 Samy Ryner Autry-Lott, DO

## 2022-05-24 NOTE — Anesthesia Postprocedure Evaluation (Signed)
Anesthesia Post Note  Patient: Sindee Stucker  Procedure(s) Performed: AN AD HOC LABOR EPIDURAL     Patient location during evaluation: Mother Baby Anesthesia Type: Epidural Level of consciousness: awake and alert Pain management: pain level controlled Vital Signs Assessment: post-procedure vital signs reviewed and stable Respiratory status: spontaneous breathing, nonlabored ventilation and respiratory function stable Cardiovascular status: stable Postop Assessment: no headache, no backache and epidural receding Anesthetic complications: no  No notable events documented.  Last Vitals:  Vitals:   05/24/22 1500 05/24/22 1840  BP: 122/66 132/71  Pulse: 94 93  Resp: 16 18  Temp: 36.7 C 36.9 C  SpO2: 100% 99%    Last Pain:  Vitals:   05/24/22 1840  TempSrc: Oral  PainSc: 5    Pain Goal: Patients Stated Pain Goal: 0 (05/24/22 0459)                 Emmaline Kluver N

## 2022-05-25 ENCOUNTER — Encounter: Payer: Self-pay | Admitting: Family Medicine

## 2022-05-25 LAB — CULTURE, BETA STREP (GROUP B ONLY)

## 2022-05-25 NOTE — Lactation Note (Addendum)
This note was copied from a baby's chart. Lactation Consultation Note  Patient Name: Stacey [name] [id number] Date: 05/25/2022 Reason for consult: Follow-up assessment;Term;Infant weight loss (-5% weight loss) Age:36 hours Per Birth Parent,  infant had 3 voids but no stools yet. Infant has been latching well and Birth Parent is experienced at Breastfeeding. Infant recently BF for 35 minutes, LC did not observe latch. Birth Parent plans to continue to latch infant but will hand express and give infant extra volume due infant not  having a stool yet. Birth Parent will continue to BF infant according to hunger cues, on demand , 8 to 12+ times within 24 hours, STS. Birth Parent knows to call RN/LC if their are any questions, concerns or need latch assistance.  Maternal Data    Feeding Mother's Current Feeding Choice: Breast Milk and Formula  LATCH Score                    Lactation Tools Discussed/Used    Interventions Interventions: Expressed milk;Hand express;Position options  Discharge    Consult Status Consult Status: Follow-up Date: 05/26/22 Follow-up type: In-patient    Frederico Hamman 05/25/2022, 6:22 PM

## 2022-05-25 NOTE — Progress Notes (Signed)
POSTPARTUM PROGRESS NOTE  Post Partum Day 1  Subjective:  Stacey [name] is a 36 y.o. [id number] s/p VD at [redacted]w[redacted]d.  She reports she is doing well. No acute events overnight. She denies any problems with ambulating, voiding or po intake. Denies nausea or vomiting.  Pain is well controlled.  Lochia is minimal.  Objective: Blood pressure 103/69, pulse (!) 102, temperature 98.3 F (36.8 C), temperature source Oral, resp. rate 16, height 5\' 7"  (1.702 m), weight 88.5 kg, last menstrual period 08/24/2021, SpO2 99 %, unknown if currently breastfeeding.  Physical Exam:  General: alert, cooperative and no distress Chest: no respiratory distress Heart:regular rate, distal pulses intact Abdomen: soft, nontender,  Uterine Fundus: firm, appropriately tender DVT Evaluation: No calf swelling or tenderness Extremities: No LE edema Skin: warm, dry  Recent Labs    05/24/22 0521  HGB 12.9  HCT 38.0    Assessment/Plan: Stacey [name] is a 36 y.o. 31 s/p VD at [redacted]w[redacted]d   PPD#1 - Doing well  Routine postpartum care GBS+, no ppx baby will stay 36 hrs. Mom wants to stay as a patient.  Contraception: interval BTL Feeding: Both Dispo: Plan for discharge 12/29.   LOS: 1 day   1/30, DO OB Fellow, [facility], Center for [facility] 05/25/2022, 7:31 AM

## 2022-05-26 MED ORDER — IBUPROFEN 600 MG PO TABS: 600.0000 mg | ORAL_TABLET | Freq: Four times a day (QID) | ORAL | 0 refills | Status: DC

## 2022-05-26 MED ORDER — ACETAMINOPHEN 325 MG PO TABS: 650.0000 mg | ORAL_TABLET | ORAL | 0 refills | Status: DC | PRN

## 2022-05-26 NOTE — Lactation Note (Addendum)
This note was copied from a baby's chart. Lactation Consultation Note  Patient Name: Stacey [name] [id number] Date: 05/26/2022 Reason for consult: Follow-up assessment Age:36 hours  P3, Father of baby states that he changed a stool diaper at approx 1400 yesterday.  He also states they have not been keeping track of all voids/stools. Assisted baby with latching with ease.  Encouraged mother to breastfeed on both breasts 8-12 times per day. Reviewed engorgement care and monitoring voids/stools.  Returned to room and suggested mother breastfeed baby on 2nd breast to increase volume.   Maternal Data Does the patient have breastfeeding experience prior to this delivery?: Yes  Feeding Mother's Current Feeding Choice: Breast Milk and Formula  LATCH Score Latch: Grasps breast easily, tongue down, lips flanged, rhythmical sucking.  Audible Swallowing: A few with stimulation  Type of Nipple: Everted at rest and after stimulation  Comfort (Breast/Nipple): Soft / non-tender  Hold (Positioning): Assistance needed to correctly position infant at breast and maintain latch.  LATCH Score: 8   Lactation Tools Discussed/Used    Interventions Interventions: Breast feeding basics reviewed;Assisted with latch;Support pillows;Education  Discharge Discharge Education: Engorgement and breast care;Warning signs for feeding baby  Consult Status Consult Status: Complete Date: 05/26/22    Dahlia Byes [facility] 05/26/2022, 12:38 PM

## 2022-05-31 ENCOUNTER — Inpatient Hospital Stay (HOSPITAL_COMMUNITY)
Admission: AD | Admit: 2022-05-31 | Payer: Medicaid Other | Source: Home / Self Care | Admitting: Obstetrics and Gynecology

## 2022-06-01 ENCOUNTER — Encounter: Payer: Self-pay | Admitting: Family Medicine

## 2022-06-01 ENCOUNTER — Telehealth (HOSPITAL_COMMUNITY): Payer: Self-pay | Admitting: *Deleted

## 2022-06-01 ENCOUNTER — Other Ambulatory Visit: Payer: Self-pay

## 2022-06-01 NOTE — Telephone Encounter (Signed)
Hospital Discharge Follow-Up Call:  Patient reports that she is having abdominal pain when she walks and not helped by ibuprofen.  She denies pain with urination.  Lochia is lessening and is not malodorous.  She is unsure if she has had a fever, but has had some chills in the evenings.  Encouraged her to call her OB and ask to be seen.  Patient said she would wait and see if she was still having the symptoms in two weeks.  Advised that she could also come to [facility]U for evaluation if needed.  EPDS today was 8 and she endorses that overall she is doing well emotionally.  Patient says that baby is well and she has no concerns about baby's health.  She reports that baby sleeps in a bassinet.  Reviewed ABCs of Safe Sleep.

## 2022-06-08 ENCOUNTER — Encounter: Payer: Self-pay | Admitting: Obstetrics and Gynecology

## 2022-06-08 ENCOUNTER — Telehealth (INDEPENDENT_AMBULATORY_CARE_PROVIDER_SITE_OTHER): Payer: Medicaid Other | Admitting: Obstetrics and Gynecology

## 2022-06-08 DIAGNOSIS — Z302 Encounter for sterilization: Secondary | ICD-10-CM

## 2022-06-08 NOTE — Progress Notes (Signed)
TELEHEALTH GYNECOLOGY VISIT ENCOUNTER NOTE  Provider location: Center for [place] [facility] [place] [facility]   Patient location: Home  I connected with Stacey [name] on 06/08/22 at  2:15 PM EST by telephone and verified that I am speaking with the correct person using two identifiers. Patient was unable to do MyChart audiovisual encounter due to technical difficulties, she tried several times.    I discussed the limitations, risks, security and privacy concerns of performing an evaluation and management service by telephone and the availability of in person appointments. I also discussed with the patient that there may be a patient responsible charge related to this service. The patient expressed understanding and agreed to proceed.   History:  Stacey [name] is a 37 y.o. [phone] female being evaluated today for BTL consult. She is s/p 12/27 SVD at 39wks. Pregnancy uncomplicated; she signed her BTL papers on 12/14. She denies any abnormal vaginal discharge, bleeding, pelvic pain or other concerns.       Past Medical History:  Diagnosis Date   Eczema 04/16/2019   Gestational diabetes    History of gestational diabetes 12/12/2021   Increased heart rate 09/05/2019   Increased heart rate   Left carotid bruit 09/05/2019   Neg imaging 08/2019  Left carotid bruit   Supervision of high risk pregnancy, antepartum 01/20/2019    Nursing Staff Provider  Office Location  [place]  Dating  LMP  Language  English  Anatomy US  Inadequate at [redacted]w[redacted]d, repeat ordered  Flu Vaccine  02/07/19 Genetic Screen  NIPS:  Normal female  AFP:  Neg  First Screen:  Quad:    TDaP vaccine  05/13/19  Hgb A1C or  GTT Early  Third trimester 95 138 104  Rhogam  n/a   LAB RESULTS   Feeding Plan Breast  Blood Type B/Positive/-- (09/11 1133)   Contraception P   Past Surgical History:  Procedure Laterality Date   NO PAST SURGERIES     The following portions of the patient's history were reviewed and updated as appropriate:  allergies, current medications, past family history, past medical history, past social history, past surgical history and problem list.   Review of Systems:  Pertinent items noted in HPI and remainder of comprehensive ROS otherwise negative.  Physical Exam:   General:  Alert, oriented and cooperative.   Mental Status: Normal mood and affect perceived. Normal judgment and thought content.  Physical exam deferred due to nature of the encounter  Labs and Imaging No results found for this or any previous visit (from the past 336 hour(s)). No results found.    Assessment and Plan:     1. Encounter for sterilization Long d/w pt re: BTL and she would like a BTL. She desires single site and is okay with filshie clips after d/w her re: that vs salpingectomies. Patient desires permanent sterilization.  Other reversible forms of contraception were discussed with patient; she declines all other modalities. Risks of procedure discussed with patient including but not limited to: risk of regret, permanence of method, bleeding, infection, injury to surrounding organs and need for additional procedures.  Failure risk of 1% with increased risk of ectopic gestation if pregnancy occurs was also discussed with patient.  Patient verbalized understanding of these risks and wants to proceed with sterilization.   I did tell her that if I thought the tubes were still on the large side, due to being recently PP, that I would perform a salpingectomy which she was fine with  Request sent to OR        I discussed the assessment and treatment plan with the patient. The patient was provided an opportunity to ask questions and all were answered. The patient agreed with the plan and demonstrated an understanding of the instructions.   The patient was advised to call back or seek an in-person evaluation/go to the ED if the symptoms worsen or if the condition fails to improve as anticipated.  I provided 15 minutes of  non-face-to-face time during this encounter.   Aletha Halim, MD Center for [place]

## 2022-06-08 NOTE — Progress Notes (Signed)
Virtual Visit via Video Note  I connected with Stacey [name] on 06/08/22 at  2:15 PM EST by a video enabled telemedicine application and verified that I am speaking with the correct person using two identifiers.  Location: Patient: Stacey [name] car  Provider: Aletha Halim, MD--in [facility] office    I discussed the limitations of evaluation and management by telemedicine and the availability of in person appointments. The patient expressed understanding and agreed to proceed.  History of Present Illness:    Observations/Objective:   Assessment and Plan:   Follow Up Instructions:    I discussed the assessment and treatment plan with the patient. The patient was provided an opportunity to ask questions and all were answered. The patient agreed with the plan and demonstrated an understanding of the instructions.   The patient was advised to call back or seek an in-person evaluation if the symptoms worsen or if the condition fails to improve as anticipated.  I provided 11 minutes of non-face-to-face time during this encounter.   Stacey Money, RN

## 2022-06-15 ENCOUNTER — Encounter (HOSPITAL_BASED_OUTPATIENT_CLINIC_OR_DEPARTMENT_OTHER): Payer: Self-pay | Admitting: Obstetrics and Gynecology

## 2022-06-15 NOTE — Progress Notes (Signed)
Spoke w/ via phone for pre-op interview--- pt Lab needs dos----  urine preg (per anes)             Lab results------ no COVID test -----patient states asymptomatic no test needed Arrive at ------- 1130 on 06-20-2022 NPO after MN NO Solid Food.  Clear liquids from MN until--- 1030 Med rec completed Medications to take morning of surgery ----- none Diabetic medication ----- Patient instructed no nail polish to be worn day of surgery Patient instructed to bring photo id and insurance card day of surgery Patient aware to have Driver (ride ) / caregiver    for 24 hours after surgery -- Pt stated unsure whom will be driver/ caregiver since just posted yesterday but verbalized understanding to have name/ number when checks in [place] of both. Patient Special Instructions ----- n/a Pre-Op special Istructions -----  sent inbox message in epic to dr Mikel Cella, requested orders Patient verbalized understanding of instructions that were given at this phone interview. Patient denies shortness of breath, chest pain, fever, cough at this phone interview.

## 2022-06-15 NOTE — Addendum Note (Signed)
Addended by: Gale Journey on: 06/15/2022 03:48 PM   Modules accepted: Orders

## 2022-06-19 ENCOUNTER — Encounter: Payer: Self-pay | Admitting: Obstetrics and Gynecology

## 2022-06-19 NOTE — Progress Notes (Signed)
Patient called in and asked if it would be okay to proceed with her surgery if she was still bleeding tomorrow. I told her that I thought it would be fine. But I instructed her to call Dr. Verlin Dike office for a definitive answer. I asked her to call me back if she could not get in touch with someone at Dr. Verlin Dike office.

## 2022-06-20 ENCOUNTER — Encounter (HOSPITAL_BASED_OUTPATIENT_CLINIC_OR_DEPARTMENT_OTHER): Payer: Self-pay | Admitting: Obstetrics and Gynecology

## 2022-06-20 ENCOUNTER — Other Ambulatory Visit: Payer: Self-pay

## 2022-06-20 ENCOUNTER — Ambulatory Visit (HOSPITAL_BASED_OUTPATIENT_CLINIC_OR_DEPARTMENT_OTHER)
Admission: RE | Admit: 2022-06-20 | Discharge: 2022-06-20 | Disposition: A | Discharge status: Home / Self Care | Payer: Medicaid Other | Attending: Obstetrics and Gynecology | Admitting: Obstetrics and Gynecology

## 2022-06-20 ENCOUNTER — Ambulatory Visit (HOSPITAL_BASED_OUTPATIENT_CLINIC_OR_DEPARTMENT_OTHER): Payer: Medicaid Other | Admitting: Anesthesiology

## 2022-06-20 ENCOUNTER — Encounter (HOSPITAL_BASED_OUTPATIENT_CLINIC_OR_DEPARTMENT_OTHER)
Admission: RE | Disposition: A | Discharge status: Home / Self Care | Payer: Self-pay | Source: Home / Self Care | Attending: Obstetrics and Gynecology

## 2022-06-20 DIAGNOSIS — N838 Other noninflammatory disorders of ovary, fallopian tube and broad ligament: Secondary | ICD-10-CM | POA: Diagnosis not present

## 2022-06-20 DIAGNOSIS — Z302 Encounter for sterilization: Secondary | ICD-10-CM | POA: Insufficient documentation

## 2022-06-20 DIAGNOSIS — D573 Sickle-cell trait: Secondary | ICD-10-CM | POA: Diagnosis not present

## 2022-06-20 DIAGNOSIS — Z3202 Encounter for pregnancy test, result negative: Secondary | ICD-10-CM | POA: Diagnosis not present

## 2022-06-20 DIAGNOSIS — Z01818 Encounter for other preprocedural examination: Secondary | ICD-10-CM

## 2022-06-20 HISTORY — DX: Presence of dental prosthetic device (complete) (partial): Z97.2

## 2022-06-20 HISTORY — DX: Presence of spectacles and contact lenses: Z97.3

## 2022-06-20 HISTORY — DX: Family history of other specified conditions: Z84.89

## 2022-06-20 HISTORY — PX: LAPAROSCOPIC BILATERAL SALPINGECTOMY: SHX5889

## 2022-06-20 LAB — CBC
HCT: 43.6 % (ref 36.0–46.0)
Hemoglobin: 13.6 g/dL (ref 12.0–15.0)
MCH: 25 pg — ABNORMAL LOW (ref 26.0–34.0)
MCHC: 31.2 g/dL (ref 30.0–36.0)
MCV: 80 fL (ref 80.0–100.0)
Platelets: 256 10*3/uL (ref 150–400)
RBC: 5.45 MIL/uL — ABNORMAL HIGH (ref 3.87–5.11)
RDW: 15 % (ref 11.5–15.5)
WBC: 4.2 10*3/uL (ref 4.0–10.5)
nRBC: 0 % (ref 0.0–0.2)

## 2022-06-20 LAB — TYPE AND SCREEN
ABO/RH(D): B POS
Antibody Screen: NEGATIVE

## 2022-06-20 LAB — POCT PREGNANCY, URINE: Preg Test, Ur: NEGATIVE

## 2022-06-20 SURGERY — SALPINGECTOMY, BILATERAL, LAPAROSCOPIC
Anesthesia: General | Laterality: Bilateral

## 2022-06-20 MED ORDER — FENTANYL CITRATE (PF) 100 MCG/2ML IJ SOLN
INTRAMUSCULAR | Status: AC
  Filled 2022-06-20: qty 2

## 2022-06-20 MED ORDER — DEXAMETHASONE SODIUM PHOSPHATE 4 MG/ML IJ SOLN
INTRAMUSCULAR | Status: DC | PRN
  Administered 2022-06-20: 5 mg via INTRAVENOUS

## 2022-06-20 MED ORDER — MEPERIDINE HCL 25 MG/ML IJ SOLN: 6.2500 mg | INTRAMUSCULAR | Status: DC | PRN

## 2022-06-20 MED ORDER — ROCURONIUM BROMIDE 10 MG/ML (PF) SYRINGE
PREFILLED_SYRINGE | INTRAVENOUS | Status: AC
  Filled 2022-06-20: qty 10

## 2022-06-20 MED ORDER — LACTATED RINGERS IV SOLN: INTRAVENOUS | Status: DC

## 2022-06-20 MED ORDER — ACETAMINOPHEN 500 MG PO TABS
1000.0000 mg | ORAL_TABLET | ORAL | Status: AC
  Administered 2022-06-20: 1000 mg via ORAL

## 2022-06-20 MED ORDER — OXYCODONE HCL 5 MG/5ML PO SOLN: 5.0000 mg | Freq: Once | ORAL | Status: DC | PRN

## 2022-06-20 MED ORDER — IBUPROFEN 600 MG PO TABS: 600.0000 mg | ORAL_TABLET | Freq: Four times a day (QID) | ORAL | 0 refills | Status: DC

## 2022-06-20 MED ORDER — SUGAMMADEX SODIUM 200 MG/2ML IV SOLN
INTRAVENOUS | Status: DC | PRN
  Administered 2022-06-20: 200 mg via INTRAVENOUS

## 2022-06-20 MED ORDER — ACETAMINOPHEN 500 MG PO TABS
ORAL_TABLET | ORAL | Status: AC
  Filled 2022-06-20: qty 2

## 2022-06-20 MED ORDER — SODIUM CHLORIDE 0.9 % IR SOLN
Status: DC | PRN
  Administered 2022-06-20: 500 mL

## 2022-06-20 MED ORDER — ONDANSETRON HCL 4 MG/2ML IJ SOLN: 4.0000 mg | Freq: Once | INTRAMUSCULAR | Status: DC | PRN

## 2022-06-20 MED ORDER — ACETAMINOPHEN 160 MG/5ML PO SOLN: 325.0000 mg | ORAL | Status: DC | PRN

## 2022-06-20 MED ORDER — ONDANSETRON HCL 4 MG/2ML IJ SOLN
INTRAMUSCULAR | Status: DC | PRN
  Administered 2022-06-20: 4 mg via INTRAVENOUS

## 2022-06-20 MED ORDER — OXYCODONE HCL 5 MG PO TABS: 5.0000 mg | ORAL_TABLET | ORAL | 0 refills | Status: AC | PRN

## 2022-06-20 MED ORDER — ONDANSETRON HCL 4 MG/2ML IJ SOLN
INTRAMUSCULAR | Status: AC
  Filled 2022-06-20: qty 2

## 2022-06-20 MED ORDER — MIDAZOLAM HCL 2 MG/2ML IJ SOLN
INTRAMUSCULAR | Status: AC
  Filled 2022-06-20: qty 2

## 2022-06-20 MED ORDER — OXYCODONE HCL 5 MG PO TABS: 5.0000 mg | ORAL_TABLET | Freq: Once | ORAL | Status: DC | PRN

## 2022-06-20 MED ORDER — PROPOFOL 10 MG/ML IV BOLUS
INTRAVENOUS | Status: DC | PRN
  Administered 2022-06-20: 200 mg via INTRAVENOUS

## 2022-06-20 MED ORDER — ACETAMINOPHEN 500 MG PO TABS: 1000.0000 mg | ORAL_TABLET | Freq: Three times a day (TID) | ORAL | 1 refills | Status: AC

## 2022-06-20 MED ORDER — CELECOXIB 200 MG PO CAPS: 400.0000 mg | ORAL_CAPSULE | ORAL | Status: DC

## 2022-06-20 MED ORDER — ACETAMINOPHEN 325 MG PO TABS: 325.0000 mg | ORAL_TABLET | ORAL | Status: DC | PRN

## 2022-06-20 MED ORDER — MIDAZOLAM HCL 5 MG/5ML IJ SOLN
INTRAMUSCULAR | Status: DC | PRN
  Administered 2022-06-20: 2 mg via INTRAVENOUS

## 2022-06-20 MED ORDER — FENTANYL CITRATE (PF) 100 MCG/2ML IJ SOLN
INTRAMUSCULAR | Status: DC | PRN
  Administered 2022-06-20 (×2): 100 ug via INTRAVENOUS

## 2022-06-20 MED ORDER — ROCURONIUM BROMIDE 100 MG/10ML IV SOLN
INTRAVENOUS | Status: DC | PRN
  Administered 2022-06-20: 10 mg via INTRAVENOUS
  Administered 2022-06-20: 60 mg via INTRAVENOUS

## 2022-06-20 MED ORDER — FENTANYL CITRATE (PF) 100 MCG/2ML IJ SOLN
25.0000 ug | INTRAMUSCULAR | Status: DC | PRN
  Administered 2022-06-20 (×2): 50 ug via INTRAVENOUS

## 2022-06-20 MED ORDER — KETOROLAC TROMETHAMINE 30 MG/ML IJ SOLN
INTRAMUSCULAR | Status: AC
  Filled 2022-06-20: qty 1

## 2022-06-20 MED ORDER — PROPOFOL 10 MG/ML IV BOLUS
INTRAVENOUS | Status: AC
  Filled 2022-06-20: qty 20

## 2022-06-20 MED ORDER — CELECOXIB 200 MG PO CAPS
ORAL_CAPSULE | ORAL | Status: AC
  Filled 2022-06-20: qty 2

## 2022-06-20 MED ORDER — LIDOCAINE HCL (CARDIAC) PF 100 MG/5ML IV SOSY
PREFILLED_SYRINGE | INTRAVENOUS | Status: DC | PRN
  Administered 2022-06-20: 100 mg via INTRAVENOUS

## 2022-06-20 MED ORDER — DEXAMETHASONE SODIUM PHOSPHATE 10 MG/ML IJ SOLN
INTRAMUSCULAR | Status: AC
  Filled 2022-06-20: qty 1

## 2022-06-20 MED ORDER — KETOROLAC TROMETHAMINE 30 MG/ML IJ SOLN
INTRAMUSCULAR | Status: DC | PRN
  Administered 2022-06-20: 30 mg via INTRAVENOUS

## 2022-06-20 MED ORDER — SENNOSIDES-DOCUSATE SODIUM 8.6-50 MG PO TABS: 1.0000 | ORAL_TABLET | Freq: Every evening | ORAL | 0 refills | Status: AC | PRN

## 2022-06-20 SURGICAL SUPPLY — 35 items
ADH SKN CLS APL DERMABOND .7 (GAUZE/BANDAGES/DRESSINGS) ×1
DERMABOND ADVANCED .7 DNX12 (GAUZE/BANDAGES/DRESSINGS) ×1 IMPLANT
DRAPE SURG IRRIG POUCH 19X23 (DRAPES) ×1 IMPLANT
DURAPREP 26ML APPLICATOR (WOUND CARE) ×1 IMPLANT
GLOVE BIO SURGEON STRL SZ7 (GLOVE) ×2 IMPLANT
GLOVE BIOGEL PI IND STRL 7.0 (GLOVE) ×2 IMPLANT
GOWN STRL REUS W/ TWL LRG LVL3 (GOWN DISPOSABLE) ×3 IMPLANT
GOWN STRL REUS W/TWL LRG LVL3 (GOWN DISPOSABLE) ×3
IRRIG SUCT STRYKERFLOW 2 WTIP (MISCELLANEOUS) ×1
IRRIGATION SUCT STRKRFLW 2 WTP (MISCELLANEOUS) IMPLANT
KIT PINK PAD W/HEAD ARE REST (MISCELLANEOUS) ×1
KIT PINK PAD W/HEAD ARM REST (MISCELLANEOUS) ×2 IMPLANT
KIT TURNOVER CYSTO (KITS) ×1 IMPLANT
LIGASURE VESSEL 5MM BLUNT TIP (ELECTROSURGICAL) IMPLANT
NDL INSUFFLATION 14GA 120MM (NEEDLE) ×1 IMPLANT
NEEDLE INSUFFLATION 14GA 120MM (NEEDLE) ×1 IMPLANT
PACK LAPAROSCOPY BASIN (CUSTOM PROCEDURE TRAY) ×1 IMPLANT
POUCH LAPAROSCOPIC INSTRUMENT (MISCELLANEOUS) ×1 IMPLANT
PROTECTOR NERVE ULNAR (MISCELLANEOUS) ×2 IMPLANT
SEALER TISSUE G2 CVD JAW 35 (ENDOMECHANICALS) IMPLANT
SEALER TISSUE G2 CVD JAW 45CM (ENDOMECHANICALS) ×1
SLEEVE ADV FIXATION 5X100MM (TROCAR) IMPLANT
SLEEVE Z-THREAD 5X100MM (TROCAR) IMPLANT
SUT VICRYL 0 UR6 27IN ABS (SUTURE) IMPLANT
SUT VICRYL 4-0 PS2 18IN ABS (SUTURE) ×1 IMPLANT
SYR 30ML LL (SYRINGE) IMPLANT
SYS BAG RETRIEVAL 10MM (BASKET)
SYSTEM BAG RETRIEVAL 10MM (BASKET) IMPLANT
SYSTEM CARTER THOMASON II (TROCAR) IMPLANT
TOWEL GREEN STERILE FF (TOWEL DISPOSABLE) ×2 IMPLANT
TRAY FOLEY W/BAG SLVR 14FR (SET/KITS/TRAYS/PACK) ×1 IMPLANT
TROCAR ADV FIXATION 5X100MM (TROCAR) ×1 IMPLANT
TROCAR KII 8X100ML NONTHREADED (TROCAR) IMPLANT
TROCAR Z-THREAD FIOS 5X100MM (TROCAR) IMPLANT
WARMER LAPAROSCOPE (MISCELLANEOUS) ×1 IMPLANT

## 2022-06-20 NOTE — Op Note (Signed)
[name] PROCEDURE DATE: 06/20/2022   PREOPERATIVE DIAGNOSIS:  Undesired fertility  POSTOPERATIVE DIAGNOSIS:  Undesired fertility  PROCEDURE:  Laparoscopic Bilateral Salpingectomy   SURGEON:  Dr. Gale Journey  ASSISTANT:  None  ANESTHESIA:  General endotracheal  COMPLICATIONS:  None immediate.  ESTIMATED BLOOD LOSS:  5 ml.  FLUIDS: 1000 ml LR.  URINE OUTPUT:  Not collected, voided immediately prior to case  INDICATIONS: 37 y.o. D3U2025 with undesired fertility, desires permanent sterilization.  Other forms of contraception were discussed with patient and emphasized alternatives of vasectomy, IUDs and Nexplanon as they have equivalent contraceptive efficacy; she declines all other modalities.  Risks of procedure discussed with patient including permanence of method, risk of regret, bleeding, infection, injury to surrounding organs and need for additional procedures including laparotomy.  Failure risk less than 0.5% with increased risk of ectopic gestation if pregnancy occurs was also discussed with patient.  Written informed consent was obtained.    FINDINGS:  Uterus with small subserosal fibroids, most notably 2cm SS fibroid at left cornua. Dilated, edematous appearing tubes & fimbriae. 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   TECHNIQUE:  After all consents were signed and questions were answered the patient was taken to the operating room where general anesthesia was induced. She was placed in dorsal lithotomy position. A catheter was used to drain her bladder. She was prepped and draped in the usual sterile fashion in the dorsal supine position. A timeout was performed.  A speculum was then placed in the patient's vagina, and the anterior lip of cervix grasped with the single-tooth tenaculum.  The [place] uterine manipulator was then advanced into the uterus.  The  speculum was removed from the vagina.  A 41mm umbilical incision was made and optical entry was used to directly enter the abdomen.. After confirmation of intraperitoneal entry, the abdomen was insufflated. The area under the point of entry was carefully inspected and no injury was noted. The patient was then placed in Trendelenburg position.  The scalpel was then used to make a 14mm incision in the LLQ and an 37mm suprapubic incision. 63mm and 2mm optiview ports were inserted, respectively. The 9mm scope was placed through the LLQ port to examine entry port at umbilicus and no injury/issue noted. The scope was then returned through the umbilical port. The fallopian tubes were cauterized, cut, and detached from their surrounding pelvic structures with the enseal. No bleeding was noted at operative sites. The tubes were removed separately through the 36mm port. A small right paratubal cyst was removed with the Enseal and removed from the body through the 75mm port. A small amount of bleeding was noted coming from the posterior uterus & filmy adhesive disease that was separated when uterus was manipulated. The uterus was cauterized with the Enseal. Pelvis & operative sites carefully inspected and noted to be hemostatic. LLQ & suprapubic ports removed under direct visualization & hemostasis noted. Gas drained from abdomen and umbilical port removed. Skin was then closed in a subcuticular fashion with 4-0 vicryl and dressings were placed.  The patient will be discharged to home as per PACU criteria.  Routine postoperative instructions given.  She will follow up in the office in 2 wks for postoperative evaluation.  Gale Journey, MD [place], [facility] for [employer], [place]

## 2022-06-20 NOTE — Anesthesia Procedure Notes (Signed)
Procedure Name: Intubation Date/Time: 06/20/2022 1:37 PM  Performed by: Justice Rocher, CRNAPre-anesthesia Checklist: Patient identified, Emergency Drugs available, Suction available, Patient being monitored and Timeout performed Patient Re-evaluated:Patient Re-evaluated prior to induction Oxygen Delivery Method: Circle system utilized Preoxygenation: Pre-oxygenation with 100% oxygen Induction Type: IV induction Ventilation: Mask ventilation without difficulty Laryngoscope Size: Mac and 3 Grade View: Grade II Tube type: Oral Tube size: 7.0 mm Number of attempts: 1 Airway Equipment and Method: Stylet and Oral airway Placement Confirmation: ETT inserted through vocal cords under direct vision, positive ETCO2, breath sounds checked- equal and bilateral and CO2 detector Secured at: 23 cm Tube secured with: Tape Dental Injury: Teeth and Oropharynx as per pre-operative assessment

## 2022-06-20 NOTE — H&P (Signed)
Stacey [name] is an 37 y.o. female. Presenting for scheduled laparoscopic bilateral salpingectomy  Doing well today. Confirms desire for permanent sterilization. Specifically reviewed risk of regret, options for non-permanent contraception, and need for IVF if desires pregnancy in future.  Menstrual History:  Patient's last menstrual period was 08/24/2021.    Past Medical History:  Diagnosis Date   Eczema 04/16/2019   Family history of adverse reaction to anesthesia    sister--- post surgery "became bloated"   History of gestational diabetes 2021   Left carotid bruit 09/05/2019   evaluated by cardiologist-- dr berry, for increased heart rate during 3rd trismester preg, after delivery back to normal, per note dr berry heard left carotid bruit,  carotid duplex in epic 09-19-2019 no evidence stenosis   Wears glasses    Wears partial dentures    upper and lower   Past Surgical History:  Procedure Laterality Date   NO PAST SURGERIES     Family History  Problem Relation Age of Onset   Hypertension Mother    Asthma Neg Hx    Birth defects Neg Hx    Cancer Neg Hx    Diabetes Neg Hx    Heart disease Neg Hx    Stroke Neg Hx    Social History:  reports that she has never smoked. She has never used smokeless tobacco. She reports that she does not drink alcohol and does not use drugs.  Allergies: No Known Allergies  Medications Prior to Admission  Medication Sig Dispense Refill Last Dose   ibuprofen (ADVIL) 600 MG tablet Take 1 tablet (600 mg total) by mouth every 6 (six) hours. 30 tablet 0 Past Month   prenatal vitamin w/FE, FA (PRENATAL 1 + 1) 27-1 MG TABS tablet Take 1 tablet by mouth daily.   06/19/2022   Review of Systems - negative except as noted in HPI  Blood pressure 125/64, pulse 70, temperature 97.9 F (36.6 C), temperature source Oral, resp. rate 17, height 5\' 6"  (1.676 m), weight 78.7 kg, last menstrual period 08/24/2021, SpO2 100 %, currently breastfeeding. Physical  Exam Constitutional:      General: She is not in acute distress.    Appearance: Normal appearance. She is not ill-appearing.  Cardiovascular:     Rate and Rhythm: Normal rate.  Pulmonary:     Effort: Pulmonary effort is normal.  Neurological:     Mental Status: She is alert.    Results for orders placed or performed during the hospital encounter of 06/20/22 (from the past 24 hour(s))  Pregnancy, urine POC     Status: None   Collection Time: 06/20/22 11:54 AM  Result Value Ref Range   Preg Test, Ur NEGATIVE NEGATIVE  CBC     Status: Abnormal   Collection Time: 06/20/22 12:30 PM  Result Value Ref Range   WBC 4.2 4.0 - 10.5 K/uL   RBC 5.45 (H) 3.87 - 5.11 MIL/uL   Hemoglobin 13.6 12.0 - 15.0 g/dL   HCT 43.6 36.0 - 46.0 %   MCV 80.0 80.0 - 100.0 fL   MCH 25.0 (L) 26.0 - 34.0 pg   MCHC 31.2 30.0 - 36.0 g/dL   RDW 15.0 11.5 - 15.5 %   Platelets 256 150 - 400 K/uL   nRBC 0.0 0.0 - 0.2 %    No results found.  Assessment/Plan: 37yo with undesired fertility presenting for laparoscopic bilateral salpingectomy for permanent sterilization  No pre op abx indications UPT negative Hgb 13.6  [name] 06/20/2022,  1:25 PM

## 2022-06-20 NOTE — Transfer of Care (Signed)
Immediate Anesthesia Transfer of Care Note  Patient: Stacey [name]  Procedure(s) Performed: LAPAROSCOPIC BILATERAL SALPINGECTOMY (Bilateral)  Patient Location: PACU  Anesthesia Type:General  Level of Consciousness: awake, alert , oriented, and patient cooperative  Airway & Oxygen Therapy: Patient Spontanous Breathing and Patient connected to nasal cannula oxygen  Post-op Assessment: Report given to RN and Post -op Vital signs reviewed and stable  Post vital signs: Reviewed and stable  Last Vitals:  Vitals Value Taken Time  BP 114/76 06/20/22 1449  Temp 36.6 C 06/20/22 1449  Pulse 70 06/20/22 1451  Resp 14 06/20/22 1451  SpO2 100 % 06/20/22 1451  Vitals shown include unvalidated device data.  Last Pain:  Vitals:   06/20/22 1216  TempSrc: Oral  PainSc: 0-No pain      Patients Stated Pain Goal: 3 (56/38/75 6433)  Complications: No notable events documented.

## 2022-06-20 NOTE — Discharge Instructions (Signed)
  Post Anesthesia Home Care Instructions ° °Activity: °Get plenty of rest for the remainder of the day. A responsible adult should stay with you for 24 hours following the procedure.  °For the next 24 hours, DO NOT: °-Drive a car °-Operate machinery °-Drink alcoholic beverages °-Take any medication unless instructed by your physician °-Make any legal decisions or sign important papers. ° °Meals: °Start with liquid foods such as gelatin or soup. Progress to regular foods as tolerated. Avoid greasy, spicy, heavy foods. If nausea and/or vomiting occur, drink only clear liquids until the nausea and/or vomiting subsides. Call your physician if vomiting continues. ° °Special Instructions/Symptoms: °Your throat may feel dry or sore from the anesthesia or the breathing tube placed in your throat during surgery. If this causes discomfort, gargle with warm salt water. The discomfort should disappear within 24 hours. ° °

## 2022-06-20 NOTE — Anesthesia Preprocedure Evaluation (Addendum)
Anesthesia Evaluation  Patient identified by MRN, date of birth, ID band Patient awake    Reviewed: Allergy & Precautions, H&P , NPO status , Patient's Chart, lab work & pertinent test results  History of Anesthesia Complications (+) Family history of anesthesia reaction  Airway Mallampati: II  TM Distance: >3 FB Neck ROM: Full    Dental no notable dental hx. (+) Poor Dentition, Chipped, Missing, Dental Advisory Given,    Pulmonary neg pulmonary ROS   Pulmonary exam normal breath sounds clear to auscultation       Cardiovascular Exercise Tolerance: Good negative cardio ROS Normal cardiovascular exam Rhythm:Regular Rate:Normal     Neuro/Psych negative neurological ROS  negative psych ROS   GI/Hepatic negative GI ROS, Neg liver ROS,,,  Endo/Other  negative endocrine ROS    Renal/GU negative Renal ROS  negative genitourinary   Musculoskeletal negative musculoskeletal ROS (+)    Abdominal   Peds negative pediatric ROS (+)  Hematology negative hematology ROS (+) Blood dyscrasia, Sickle cell trait   Anesthesia Other Findings   Reproductive/Obstetrics negative OB ROS                             Anesthesia Physical Anesthesia Plan  ASA: 2  Anesthesia Plan: General   Post-op Pain Management: Ofirmev IV (intra-op)*   Induction: Intravenous  PONV Risk Score and Plan: 3 and Ondansetron, Dexamethasone and Treatment may vary due to age or medical condition  Airway Management Planned: Oral ETT  Additional Equipment: None  Intra-op Plan:   Post-operative Plan: Extubation in OR  Informed Consent: I have reviewed the patients History and Physical, chart, labs and discussed the procedure including the risks, benefits and alternatives for the proposed anesthesia with the patient or authorized representative who has indicated his/her understanding and acceptance.       Plan Discussed  with: Anesthesiologist and CRNA  Anesthesia Plan Comments: (  )       Anesthesia Quick Evaluation

## 2022-06-21 LAB — SURGICAL PATHOLOGY

## 2022-06-22 ENCOUNTER — Encounter (HOSPITAL_BASED_OUTPATIENT_CLINIC_OR_DEPARTMENT_OTHER): Payer: Self-pay | Admitting: Obstetrics and Gynecology

## 2022-06-22 NOTE — Anesthesia Postprocedure Evaluation (Signed)
Anesthesia Post Note  Patient: Stacey [name]  Procedure(s) Performed: LAPAROSCOPIC BILATERAL SALPINGECTOMY (Bilateral)     Patient location during evaluation: PACU Anesthesia Type: General Level of consciousness: awake and alert Pain management: pain level controlled Vital Signs Assessment: post-procedure vital signs reviewed and stable Respiratory status: spontaneous breathing, nonlabored ventilation, respiratory function stable and patient connected to nasal cannula oxygen Cardiovascular status: blood pressure returned to baseline and stable Postop Assessment: no apparent nausea or vomiting Anesthetic complications: no  No notable events documented.  Last Vitals:  Vitals:   06/20/22 1550 06/20/22 1620  BP:  125/67  Pulse: (!) 56 64  Resp: 11 19  Temp:    SpO2: 100% 100%    Last Pain:  Vitals:   06/20/22 1449  TempSrc:   PainSc: Asleep   Pain Goal: Patients Stated Pain Goal: 4 (06/20/22 1449)                 Val Schiavo

## 2022-07-05 ENCOUNTER — Ambulatory Visit: Payer: Medicaid Other | Admitting: Obstetrics and Gynecology

## 2022-07-06 ENCOUNTER — Encounter: Payer: Self-pay | Admitting: Advanced Practice Midwife

## 2022-07-07 ENCOUNTER — Other Ambulatory Visit: Payer: Self-pay

## 2022-07-07 ENCOUNTER — Encounter: Payer: Self-pay | Admitting: Advanced Practice Midwife

## 2022-07-07 ENCOUNTER — Ambulatory Visit (INDEPENDENT_AMBULATORY_CARE_PROVIDER_SITE_OTHER): Payer: Medicaid Other | Admitting: Advanced Practice Midwife

## 2022-07-07 VITALS — BP 118/62 | HR 79 | Wt 173.9 lb

## 2022-07-07 DIAGNOSIS — M6289 Other specified disorders of muscle: Secondary | ICD-10-CM

## 2022-07-07 DIAGNOSIS — K5900 Constipation, unspecified: Secondary | ICD-10-CM

## 2022-07-07 DIAGNOSIS — R102 Pelvic and perineal pain: Secondary | ICD-10-CM

## 2022-07-07 MED ORDER — METAMUCIL 0.52 G PO CAPS: 0.5200 g | ORAL_CAPSULE | Freq: Every day | ORAL | 12 refills | Status: AC

## 2022-07-07 NOTE — Progress Notes (Signed)
[facility] Partum Visit Note  Stacey [name] is a 37 y.o. [phone] female who presents for a postpartum visit. She is 6 weeks and 2 days postpartum following a normal spontaneous vaginal delivery.  I have fully reviewed the prenatal and intrapartum course. The delivery was at 73 gestational weeks.  Anesthesia: epidural. Postpartum course has been well. Baby is doing well. Baby is feeding by breast. Bleeding no bleeding. Bowel function is normal. Bladder function is normal. Patient is not sexually active. Contraception method is tubal ligation. Postpartum depression screening: negative. Husband had surgery and pt has needed to take care of him. Doesn't not have much help with baby but states she is coping well.    [facility] - 07/07/22 1012       Pregnancy Intention Screening   Does the patient want to become pregnant in the next year? No    Does the patient's partner want to become pregnant in the next year? No    Would the patient like to discuss contraceptive options today? N/A      Contraception Wrap Up   Current Method Female Sterilization    End Method Female Sterilization    Contraception Counseling Provided No    How was the end contraceptive method provided? N/A            The pregnancy intention screening data noted above was reviewed. Potential methods of contraception were discussed. The patient elected to proceed with Female Sterilization.   Edinburgh Postnatal Depression Scale - 07/07/22 0934       Edinburgh Postnatal Depression Scale:  In the Past 7 Days   I have been able to laugh and see the funny side of things. 0    I have looked forward with enjoyment to things. 0    I have blamed myself unnecessarily when things went wrong. 0    I have been anxious or worried for no good reason. 2    I have felt scared or panicky for no good reason. 0    Things have been getting on top of me. 2    I have been so unhappy that I have had difficulty sleeping. 0    I have felt sad or  miserable. 1    I have been so unhappy that I have been crying. 0    The thought of harming myself has occurred to me. 0    Edinburgh Postnatal Depression Scale Total 5             Health Maintenance Due  Topic Date Due   COVID-19 Vaccine (1) Never done    The following portions of the patient's history were reviewed and updated as appropriate: allergies, current medications, past family history, past medical history, past social history, past surgical history, and problem list.  Review of Systems A comprehensive review of systems was negative except for: Gastrointestinal: positive for RLQ abdominal pain and constipation.  Musculoskeletal: positive for pubic symphysis pain and tenderness  Objective:  BP 118/62   Pulse 79   Wt 173 lb 14.4 oz (78.9 kg)   LMP 08/24/2021 Comment: SVD  05-24-2022  Breastfeeding Yes   BMI 28.07 kg/m    General:  alert, cooperative, appears stated age, and no distress   Breasts:  not indicated  Lungs: Nml rate and effort  Heart:  regular rate   Abdomen: soft, mild tenderness around incisions. No rebound tenderness or guarding; bowel sounds normal; no masses, no organomegaly and Lap ports healing well. No drainage,  erythema, warmth or tenderness.   Wound well approximated incision. Lap ports healing well. No drainage, erythema, warmth or tenderness.   GU exam:  not indicated       Assessment:   1. Pelvic pain  - Ambulatory referral to Physical Therapy  2. Pelvic floor dysfunction in female  - Ambulatory referral to Physical Therapy  3. Constipation, unspecified constipation type  - psyllium (METAMUCIL) 0.52 g capsule; Take 1 capsule (0.52 g total) by mouth daily.  Dispense: 30 capsule; Refill: 12 - Ambulatory referral to Physical Therapy  4. Postpartum care and examination - Healing well   Plan:   Essential components of care per ACOG recommendations:  1.  Mood and well being: Patient with negative depression screening today.  Reviewed local resources for support. Encouraged pt to enlist help of church friends. Discussed ways that they could help.  - Patient tobacco use? No.   - hx of drug use? No.    2. Infant care and feeding:  -Patient currently breastmilk feeding? Yes. Discussed returning to work and pumping. Reviewed importance of draining breast regularly to support lactation. Patient needs a work note. Patient was provided letter for work to allow for every 2-3 hr pumping breaks, and to be granted a private location to express breastmilk and refrigerated area to store breastmilk.  -Social determinants of health (SDOH) reviewed in EPIC. No concerns.   3. Sexuality, contraception and birth spacing - Patient does not want a pregnancy in the next year.  Desired family size is 3 children.  - Reviewed reproductive life planning. Reviewed contraceptive methods based on pt preferences and effectiveness.  Patient desired Female Sterilization today.   - Discussed birth spacing of 18 months  4. Sleep and fatigue -Encouraged family/partner/community support of 4 hrs of uninterrupted sleep to help with mood and fatigue  5. Physical Recovery  - Discussed patients delivery and complications. She describes her labor as mixed. Did not get benefit of epidural because of rapid labor.  - Patient had a C-section, no problems at delivery. Patient had a 1st degree laceration. Perineal healing reviewed. Patient expressed understanding - Patient has urinary incontinence? No. - Patient is safe to resume physical and sexual activity  6.  Health Maintenance - HM due items addressed  - Last pap smear  Diagnosis  Date Value Ref Range Status  10/07/2019   Final   - Negative for intraepithelial lesion or malignancy (NILM)   Pap smear not done at today's visit.  -Breast Cancer screening indicated? No.   7. Chronic Disease/Pregnancy Condition follow up: Pre-diabetes.   - PCP follow up. Has PCP.   Manya Silvas, [employer] for  [employer], [place]

## 2022-07-10 ENCOUNTER — Telehealth: Payer: Medicaid Other | Admitting: Obstetrics and Gynecology

## 2022-07-10 ENCOUNTER — Encounter: Payer: Self-pay | Admitting: Obstetrics and Gynecology

## 2022-07-10 ENCOUNTER — Telehealth: Payer: Self-pay | Admitting: General Practice

## 2023-09-01 ENCOUNTER — Encounter (HOSPITAL_BASED_OUTPATIENT_CLINIC_OR_DEPARTMENT_OTHER): Payer: Self-pay

## 2023-09-01 ENCOUNTER — Emergency Department (HOSPITAL_BASED_OUTPATIENT_CLINIC_OR_DEPARTMENT_OTHER)
Admission: EM | Admit: 2023-09-01 | Discharge: 2023-09-01 | Disposition: A | Discharge status: Home / Self Care | Attending: Emergency Medicine | Admitting: Emergency Medicine

## 2023-09-01 ENCOUNTER — Other Ambulatory Visit: Payer: Self-pay

## 2023-09-01 DIAGNOSIS — H5789 Other specified disorders of eye and adnexa: Secondary | ICD-10-CM | POA: Diagnosis present

## 2023-09-01 DIAGNOSIS — J302 Other seasonal allergic rhinitis: Secondary | ICD-10-CM | POA: Insufficient documentation

## 2023-09-01 MED ORDER — FLUTICASONE PROPIONATE 50 MCG/ACT NA SUSP: 1.0000 | Freq: Every day | NASAL | 2 refills | Status: AC

## 2023-09-01 MED ORDER — CETIRIZINE HCL 10 MG PO TABS: 10.0000 mg | ORAL_TABLET | Freq: Every day | ORAL | 2 refills | Status: AC

## 2023-09-01 MED ORDER — CARBOXYMETHYLCELLULOSE SODIUM 1 % OP SOLN: 1.0000 [drp] | Freq: Three times a day (TID) | OPHTHALMIC | 12 refills | Status: AC

## 2023-09-01 NOTE — Discharge Instructions (Signed)
 You were seen today for watery eyes and sneezing.  This is likely related to seasonal allergies.  Switch from Claritin to Zyrtec.  Start Flonase and refresh eyedrops.

## 2023-09-01 NOTE — ED Notes (Signed)
 Discharge instructions reviewed.   Newly prescribed medications discussed. Pharmacy verified.   Opportunity for questions and concerns provided.   Alert, oriented and ambulatory. Displays no signs of distress.

## 2023-09-01 NOTE — ED Triage Notes (Signed)
 Pt presents via POV c/o eye itching, sneezing, and runny nose. Reports took Claritin without resolution.

## 2023-09-01 NOTE — ED Provider Notes (Signed)
 [place] EMERGENCY DEPARTMENT AT [facility] Provider Note   CSN: [id number] Arrival date & time: 09/01/23  1930     History  Chief Complaint  Patient presents with   Eye Problem    Stacey [name] is a 38 y.o. female.  HPI     This is a 38 year old female who presents with eye itching, sneezing, runny nose.  Patient reports over 1 week of symptoms.  She has tried Claritin and Benadryl with minimal relief.  She reports that she has not had significant seasonal allergies in the past.  She has not had any shortness of breath, cough.  She does report occasional itchy throat.  Home Medications Prior to Admission medications   Medication Sig Start Date End Date Taking? Authorizing Provider  carboxymethylcellulose 1 % ophthalmic solution Apply 1 drop to eye 3 (three) times daily. 09/01/23  Yes Lawayne Hartig, Mayer Masker, MD  cetirizine (ZYRTEC ALLERGY) 10 MG tablet Take 1 tablet (10 mg total) by mouth daily. 09/01/23  Yes Ekam Bonebrake, Mayer Masker, MD  fluticasone (FLONASE) 50 MCG/ACT nasal spray Place 1 spray into both nostrils daily. 09/01/23  Yes Yisel Megill, Mayer Masker, MD  acetaminophen (TYLENOL) 500 MG tablet Take 2 tablets (1,000 mg total) by mouth every 8 (eight) hours. 06/20/22   Lennart Pall, MD  ibuprofen (ADVIL) 600 MG tablet Take 1 tablet (600 mg total) by mouth every 6 (six) hours. 06/20/22   Lennart Pall, MD  oxyCODONE (ROXICODONE) 5 MG immediate release tablet Take 1 tablet (5 mg total) by mouth every 4 (four) hours as needed for severe pain. 06/20/22   Lennart Pall, MD  prenatal vitamin w/FE, FA (PRENATAL 1 + 1) 27-1 MG TABS tablet Take 1 tablet by mouth daily.    [provider]  psyllium (METAMUCIL) 0.52 g capsule Take 1 capsule (0.52 g total) by mouth daily. 07/07/22   Katrinka Blazing, [place], CNM  senna-docusate (SENOKOT-S) 8.6-50 MG tablet Take 1 tablet by mouth at bedtime as needed for mild constipation. 06/20/22   Lennart Pall, MD      Allergies    Patient  has no known allergies.    Review of Systems   Review of Systems  Constitutional:  Negative for fever.  HENT:  Positive for rhinorrhea and sore throat.   Eyes:  Positive for discharge and itching.  Respiratory:  Negative for wheezing.   All other systems reviewed and are negative.   Physical Exam Updated Vital Signs BP 133/78   Pulse 92   Temp 98 F (36.7 C)   Resp 14   SpO2 100%  Physical Exam Vitals and nursing note reviewed.  Constitutional:      Appearance: She is well-developed.  HENT:     Head: Normocephalic and atraumatic.     Mouth/Throat:     Comments: Postnasal drip noted Eyes:     Pupils: Pupils are equal, round, and reactive to light.     Comments: Bilateral injected conjunctive with watery drainage  Cardiovascular:     Rate and Rhythm: Normal rate and regular rhythm.     Heart sounds: Normal heart sounds.  Pulmonary:     Effort: Pulmonary effort is normal. No respiratory distress.     Breath sounds: No wheezing.  Abdominal:     Palpations: Abdomen is soft.  Musculoskeletal:     Cervical back: Neck supple.  Skin:    General: Skin is warm and dry.  Neurological:     Mental Status: She is alert and oriented  to person, place, and time.  Psychiatric:        Mood and Affect: Mood normal.     ED Results / Procedures / Treatments   Labs (all labs ordered are listed, but only abnormal results are displayed) Labs Reviewed - No data to display  EKG None  Radiology No results found.  Procedures Procedures    Medications Ordered in ED Medications - No data to display  ED Course/ Medical Decision Making/ A&P                                 Medical Decision Making Risk OTC drugs.   This patient presents to the ED for concern of watery eyes, itchy throat, this involves an extensive number of treatment options, and is a complaint that carries with it a high risk of complications and morbidity.  I considered the following differential and  admission for this acute, potentially life threatening condition.  The differential diagnosis includes no allergies, conjunctivitis, viral illness  MDM:    This is a 38 year old female who presents with itchy watery eyes, itchy throat, and runny nose.  Given time of year, highly suspect seasonal allergies.  She has classic allergic conjunctivitis and postnasal drip.  Will switch from Claritin to Zyrtec.  Also recommend Flonase and refresh eyedrops.  (Labs, imaging, consults)  Labs: I Ordered, and personally interpreted labs.  The pertinent results include: N/A  Imaging Studies ordered: I ordered imaging studies including N/A I independently visualized and interpreted imaging. I agree with the radiologist interpretation  Additional history obtained from chart review.  External records from outside source obtained and reviewed including prior evaluations  Cardiac Monitoring: The patient was not maintained on a cardiac monitor.  If on the cardiac monitor, I personally viewed and interpreted the cardiac monitored which showed an underlying rhythm of: N/A  Reevaluation: After the interventions noted above, I reevaluated the patient and found that they have :stayed the same  Social Determinants of Health:  lives independently  Disposition: Discharge  Co morbidities that complicate the patient evaluation  Past Medical History:  Diagnosis Date   Eczema 04/16/2019   Family history of adverse reaction to anesthesia    sister--- post surgery "became bloated"   History of gestational diabetes 2021   Left carotid bruit 09/05/2019   evaluated by cardiologist-- dr berry, for increased heart rate during 3rd trismester preg, after delivery back to normal, per note dr berry heard left carotid bruit,  carotid duplex in epic 09-19-2019 no evidence stenosis   Wears glasses    Wears partial dentures    upper and lower     Medicines Meds ordered this encounter  Medications   cetirizine  (ZYRTEC ALLERGY) 10 MG tablet    Sig: Take 1 tablet (10 mg total) by mouth daily.    Dispense:  30 tablet    Refill:  2   carboxymethylcellulose 1 % ophthalmic solution    Sig: Apply 1 drop to eye 3 (three) times daily.    Dispense:  30 mL    Refill:  12   fluticasone (FLONASE) 50 MCG/ACT nasal spray    Sig: Place 1 spray into both nostrils daily.    Dispense:  18.2 mL    Refill:  2    I have reviewed the patients home medicines and have made adjustments as needed  Problem List / ED Course: Problem List Items Addressed This Visit  None Visit Diagnoses       Seasonal allergies    -  Primary                   Final Clinical Impression(s) / ED Diagnoses Final diagnoses:  Seasonal allergies    Rx / DC Orders ED Discharge Orders          Ordered    cetirizine (ZYRTEC ALLERGY) 10 MG tablet  Daily        09/01/23 2308    carboxymethylcellulose 1 % ophthalmic solution  3 times daily        09/01/23 2308    fluticasone (FLONASE) 50 MCG/ACT nasal spray  Daily        09/01/23 2308              Shon Baton, MD 09/01/23 2316

## 2024-05-20 ENCOUNTER — Encounter (HOSPITAL_COMMUNITY): Payer: Self-pay | Admitting: *Deleted

## 2024-05-20 ENCOUNTER — Ambulatory Visit (HOSPITAL_COMMUNITY)
Admission: EM | Admit: 2024-05-20 | Discharge: 2024-05-20 | Disposition: A | Discharge status: Home / Self Care | Attending: Emergency Medicine | Admitting: Emergency Medicine

## 2024-05-20 DIAGNOSIS — R10A1 Flank pain, right side: Secondary | ICD-10-CM | POA: Insufficient documentation

## 2024-05-20 DIAGNOSIS — Z3202 Encounter for pregnancy test, result negative: Secondary | ICD-10-CM

## 2024-05-20 DIAGNOSIS — R109 Unspecified abdominal pain: Secondary | ICD-10-CM | POA: Insufficient documentation

## 2024-05-20 DIAGNOSIS — R110 Nausea: Secondary | ICD-10-CM | POA: Diagnosis not present

## 2024-05-20 LAB — CBC WITH DIFFERENTIAL/PLATELET
Abs Immature Granulocytes: 0 K/uL (ref 0.00–0.07)
Basophils Absolute: 0 K/uL (ref 0.0–0.1)
Basophils Relative: 1 %
Eosinophils Absolute: 0.2 K/uL (ref 0.0–0.5)
Eosinophils Relative: 4 %
HCT: 37.1 % (ref 36.0–46.0)
Hemoglobin: 12 g/dL (ref 12.0–15.0)
Immature Granulocytes: 0 %
Lymphocytes Relative: 43 %
Lymphs Abs: 1.7 K/uL (ref 0.7–4.0)
MCH: 24.7 pg — ABNORMAL LOW (ref 26.0–34.0)
MCHC: 32.3 g/dL (ref 30.0–36.0)
MCV: 76.3 fL — ABNORMAL LOW (ref 80.0–100.0)
Monocytes Absolute: 0.4 K/uL (ref 0.1–1.0)
Monocytes Relative: 11 %
Neutro Abs: 1.6 K/uL — ABNORMAL LOW (ref 1.7–7.7)
Neutrophils Relative %: 41 %
Platelets: 311 K/uL (ref 150–400)
RBC: 4.86 MIL/uL (ref 3.87–5.11)
RDW: 15 % (ref 11.5–15.5)
WBC: 3.9 K/uL — ABNORMAL LOW (ref 4.0–10.5)
nRBC: 0 % (ref 0.0–0.2)

## 2024-05-20 LAB — COMPREHENSIVE METABOLIC PANEL WITH GFR
ALT: 17 U/L (ref 0–44)
AST: 22 U/L (ref 15–41)
Albumin: 3.8 g/dL (ref 3.5–5.0)
Alkaline Phosphatase: 100 U/L (ref 38–126)
Anion gap: 8 (ref 5–15)
BUN: 11 mg/dL (ref 6–20)
CO2: 23 mmol/L (ref 22–32)
Calcium: 9 mg/dL (ref 8.9–10.3)
Chloride: 103 mmol/L (ref 98–111)
Creatinine, Ser: 0.73 mg/dL (ref 0.44–1.00)
GFR, Estimated: >60 mL/min
Glucose, Bld: 92 mg/dL (ref 70–99)
Potassium: 4.3 mmol/L (ref 3.5–5.1)
Sodium: 135 mmol/L (ref 135–145)
Total Bilirubin: 0.3 mg/dL (ref 0.0–1.2)
Total Protein: 7.3 g/dL (ref 6.5–8.1)

## 2024-05-20 LAB — POCT URINE DIPSTICK
Bilirubin, UA: NEGATIVE
Blood, UA: NEGATIVE
Glucose, UA: NEGATIVE mg/dL
Ketones, POC UA: NEGATIVE mg/dL
Leukocytes, UA: NEGATIVE
Nitrite, UA: NEGATIVE
Protein Ur, POC: NEGATIVE mg/dL
Spec Grav, UA: 1.01
Urobilinogen, UA: 0.2 U/dL
pH, UA: 7

## 2024-05-20 LAB — POCT URINE PREGNANCY: Preg Test, Ur: NEGATIVE

## 2024-05-20 LAB — LIPASE, BLOOD: Lipase: 38 U/L (ref 11–51)

## 2024-05-20 MED ORDER — ONDANSETRON 4 MG PO TBDP: 4.0000 mg | ORAL_TABLET | Freq: Three times a day (TID) | ORAL | 0 refills | Status: AC | PRN

## 2024-05-20 MED ORDER — IBUPROFEN 600 MG PO TABS: 600.0000 mg | ORAL_TABLET | Freq: Four times a day (QID) | ORAL | 0 refills | Status: AC | PRN

## 2024-05-20 NOTE — Discharge Instructions (Signed)
 As discussed there are many reasons that you could be experiencing this pain.  Based on exam findings today I do not believe that this requires emergent imaging in the emergency department.  I recommend following up with your primary care provider for further evaluation and management if your pain continues.  I have attached information for a family medicine doctor in the area to get established with if you are unable to get reestablished with your primary care provider.  We have drawn some basic labs today to evaluate your liver, kidney, and pancreatic function as well as checking your white blood cell count which would indicate infection if severely elevated.  If any of these labs are concerning someone will call and advise treatment at that time.  I have prescribed Zofran  that you can take every 8 hours as needed for nausea and vomiting. Alternate between Tylenol  and ibuprofen  as needed for pain. Make sure you are staying hydrated and getting plenty of rest. If you develop worsening abdominal pain, excessive vomiting, blood in your vomit or stool, high fevers, or severe weakness please seek immediate medical treatment in the emergency department.

## 2024-05-20 NOTE — ED Triage Notes (Signed)
 Pt states she has right sided pain, nausea an fatigue since 02/2024. She states she came in today since pain has been getting worse. She takes IBU as needed

## 2024-05-20 NOTE — ED Provider Notes (Signed)
 " [facility]    CSN: [id number] Arrival date & time: 05/20/24  1328      History   Chief Complaint Chief Complaint  Patient presents with   Nausea   Fatigue   Flank Pain    HPI Stacey [name] is a 38 y.o. female.   Patient presents with intermittent right sided abdominal and flank pain that began sometime in the middle of October.  Patient states that when the pain occurs she becomes very nauseated and fatigued.    Patient denies any vomiting, diarrhea, constipation, blood in stool, dysuria, hematuria, urinary frequency/urgency, abnormal vaginal discharge/bleeding, and fever.  Patient states the pain has been getting progressively worse and therefore she came in today to have this evaluated.    Patient reports that she has been taking Tylenol  and ibuprofen .  Patient reports that the ibuprofen  has been providing her for some relief however the Tylenol  has not.  Patient denies any history of abdominal surgeries.  The history is provided by the patient and medical records.  Flank Pain    Past Medical History:  Diagnosis Date   Eczema 04/16/2019   Family history of adverse reaction to anesthesia    sister--- post surgery became bloated   History of gestational diabetes 2021   Left carotid bruit 09/05/2019   evaluated by cardiologist-- dr berry, for increased heart rate during 3rd trismester preg, after delivery back to normal, per note dr berry heard left carotid bruit,  carotid duplex in epic 09-19-2019 no evidence stenosis   Wears glasses    Wears partial dentures    upper and lower    Patient Active Problem List   Diagnosis Date Noted   Prediabetes 12/07/2021   Hidradenitis 08/01/2017   Sickle cell trait 02/14/2017   Alpha thalassemia trait 02/14/2017    Past Surgical History:  Procedure Laterality Date   LAPAROSCOPIC BILATERAL SALPINGECTOMY Bilateral 06/20/2022   Procedure: LAPAROSCOPIC BILATERAL SALPINGECTOMY;  Surgeon: Erik Kieth BROCKS, MD;   Location: [facility];  Service: Gynecology;  Laterality: Bilateral;   NO PAST SURGERIES      OB History     Gravida  4   Para  3   Term  3   Preterm  0   AB  1   Living  3      SAB  1   IAB  0   Ectopic  0   Multiple  0   Live Births  3            Home Medications    Prior to Admission medications  Medication Sig Start Date End Date Taking? Authorizing Provider  ibuprofen  (ADVIL ) 600 MG tablet Take 1 tablet (600 mg total) by mouth every 6 (six) hours as needed. 05/20/24  Yes Johnie, Jetaime Pinnix A, NP  ondansetron  (ZOFRAN -ODT) 4 MG disintegrating tablet Take 1 tablet (4 mg total) by mouth every 8 (eight) hours as needed for nausea or vomiting. 05/20/24  Yes Johnie, Saharra Santo A, NP  acetaminophen  (TYLENOL ) 500 MG tablet Take 2 tablets (1,000 mg total) by mouth every 8 (eight) hours. 06/20/22   Erik Kieth BROCKS, MD  carboxymethylcellulose 1 % ophthalmic solution Apply 1 drop to eye 3 (three) times daily. 09/01/23   Horton, Charmaine FALCON, MD  cetirizine  (ZYRTEC  ALLERGY) 10 MG tablet Take 1 tablet (10 mg total) by mouth daily. 09/01/23   Horton, Charmaine FALCON, MD  fluticasone  (FLONASE ) 50 MCG/ACT nasal spray Place 1 spray into both nostrils daily. 09/01/23  Horton, Charmaine FALCON, MD  oxyCODONE  (ROXICODONE ) 5 MG immediate release tablet Take 1 tablet (5 mg total) by mouth every 4 (four) hours as needed for severe pain. 06/20/22   Erik Kieth BROCKS, MD  prenatal vitamin w/FE, FA (PRENATAL 1 + 1) 27-1 MG TABS tablet Take 1 tablet by mouth daily.    [provider]  psyllium (METAMUCIL) 0.52 g capsule Take 1 capsule (0.52 g total) by mouth daily. 07/07/22   Smith, Virginia , CNM  senna-docusate (SENOKOT-S) 8.6-50 MG tablet Take 1 tablet by mouth at bedtime as needed for mild constipation. 06/20/22   Erik Kieth BROCKS, MD    Family History Family History  Problem Relation Age of Onset   Hypertension Mother    Asthma Neg Hx    Birth defects Neg Hx    Cancer Neg  Hx    Diabetes Neg Hx    Heart disease Neg Hx    Stroke Neg Hx     Social History Social History[1]   Allergies   Patient has no known allergies.   Review of Systems Review of Systems  Genitourinary:  Positive for flank pain.   Per HPI  Physical Exam Triage Vital Signs ED Triage Vitals  Encounter Vitals Group     BP 05/20/24 1448 113/70     Girls Systolic BP Percentile --      Girls Diastolic BP Percentile --      Boys Systolic BP Percentile --      Boys Diastolic BP Percentile --      Pulse Rate 05/20/24 1448 86     Resp 05/20/24 1448 16     Temp 05/20/24 1448 98.9 F (37.2 C)     Temp Source 05/20/24 1448 Oral     SpO2 05/20/24 1448 98 %     Weight --      Height --      Head Circumference --      Peak Flow --      Pain Score 05/20/24 1447 4     Pain Loc --      Pain Education --      Exclude from Growth Chart --    No data found.  Updated Vital Signs BP 113/70 (BP Location: Left Arm)   Pulse 86   Temp 98.9 F (37.2 C) (Oral)   Resp 16   LMP 05/06/2024 (Approximate)   SpO2 98%   Breastfeeding No   Visual Acuity Right Eye Distance:   Left Eye Distance:   Bilateral Distance:    Right Eye Near:   Left Eye Near:    Bilateral Near:     Physical Exam Vitals and nursing note reviewed.  Constitutional:      General: She is awake. She is not in acute distress.    Appearance: Normal appearance. She is well-developed and well-groomed. She is not ill-appearing.  Abdominal:     General: Abdomen is flat. Bowel sounds are normal.     Palpations: Abdomen is soft.     Tenderness: There is abdominal tenderness in the right lower quadrant. There is no right CVA tenderness, left CVA tenderness, guarding or rebound. Negative signs include Murphy's sign, Rovsing's sign, McBurney's sign, psoas sign and obturator sign.      Comments: Mild tenderness noted to right mid to lower abdomen without guarding or rebound tenderness.  Skin:    General: Skin is warm and  dry.  Neurological:     Mental Status: She is alert.  Psychiatric:  Behavior: Behavior is cooperative.      UC Treatments / Results  Labs (all labs ordered are listed, but only abnormal results are displayed) Labs Reviewed  POCT URINE DIPSTICK - Normal  POCT URINE PREGNANCY - Normal  CBC WITH DIFFERENTIAL/PLATELET  COMPREHENSIVE METABOLIC PANEL WITH GFR  LIPASE, BLOOD    EKG   Radiology No results found.  Procedures Procedures (including critical care time)  Medications Ordered in UC Medications - No data to display  Initial Impression / Assessment and Plan / UC Course  I have reviewed the triage vital signs and the nursing notes.  Pertinent labs & imaging results that were available during my care of the patient were reviewed by me and considered in my medical decision making (see chart for details).     Patient is overall well-appearing.  Vitals are stable.  Mild tenderness noted to right mid to lower abdomen without guarding or rebound tenderness.  Negative McBurney's, Rovsing's, psoas, and obturator.  No signs of acute abdomen at this time.  Urinalysis unremarkable, UPT negative.  Ordered CBC, CMP, and lipase to rule out underlying causes related to abdominal/flank pain.  Prescribed Zofran  as needed for nausea.  Recommend ibuprofen  and Tylenol  as needed for pain.  Discussed follow-up, return, and strict ER precautions. Final Clinical Impressions(s) / UC Diagnoses   Final diagnoses:  Right sided abdominal pain  Right flank pain  Nausea     Discharge Instructions      As discussed there are many reasons that you could be experiencing this pain.  Based on exam findings today I do not believe that this requires emergent imaging in the emergency department.  I recommend following up with your primary care provider for further evaluation and management if your pain continues.  I have attached information for a family medicine doctor in the area to get  established with if you are unable to get reestablished with your primary care provider.  We have drawn some basic labs today to evaluate your liver, kidney, and pancreatic function as well as checking your white blood cell count which would indicate infection if severely elevated.  If any of these labs are concerning someone will call and advise treatment at that time.  I have prescribed Zofran  that you can take every 8 hours as needed for nausea and vomiting. Alternate between Tylenol  and ibuprofen  as needed for pain. Make sure you are staying hydrated and getting plenty of rest. If you develop worsening abdominal pain, excessive vomiting, blood in your vomit or stool, high fevers, or severe weakness please seek immediate medical treatment in the emergency department.   ED Prescriptions     Medication Sig Dispense Auth. Provider   ibuprofen  (ADVIL ) 600 MG tablet Take 1 tablet (600 mg total) by mouth every 6 (six) hours as needed. 30 tablet Johnie Flaming A, NP   ondansetron  (ZOFRAN -ODT) 4 MG disintegrating tablet Take 1 tablet (4 mg total) by mouth every 8 (eight) hours as needed for nausea or vomiting. 10 tablet Johnie Flaming A, NP      PDMP not reviewed this encounter.    [1]  Social History Tobacco Use   Smoking status: Never   Smokeless tobacco: Never  Vaping Use   Vaping status: Never Used  Substance Use Topics   Alcohol use: Never   Drug use: Never     Johnie Flaming LABOR, NP 05/20/24 1527  "

## 2024-05-21 ENCOUNTER — Ambulatory Visit (HOSPITAL_COMMUNITY): Payer: Self-pay
